# Patient Record
Sex: Male | Born: 1985 | Race: White | Hispanic: No | Marital: Single | Smoking: Current every day smoker
Health system: Southern US, Community
[De-identification: ages and names within clinical notes are randomized; demographics above are authoritative.]

## PROBLEM LIST (undated history)

## (undated) DIAGNOSIS — R569 Unspecified convulsions: Secondary | ICD-10-CM

## (undated) DIAGNOSIS — F419 Anxiety disorder, unspecified: Secondary | ICD-10-CM

## (undated) DIAGNOSIS — F29 Unspecified psychosis not due to a substance or known physiological condition: Secondary | ICD-10-CM

## (undated) DIAGNOSIS — F1910 Other psychoactive substance abuse, uncomplicated: Secondary | ICD-10-CM

## (undated) DIAGNOSIS — R7303 Prediabetes: Secondary | ICD-10-CM

## (undated) DIAGNOSIS — F32A Depression, unspecified: Secondary | ICD-10-CM

## (undated) DIAGNOSIS — F329 Major depressive disorder, single episode, unspecified: Secondary | ICD-10-CM

## (undated) DIAGNOSIS — I10 Essential (primary) hypertension: Secondary | ICD-10-CM

## (undated) DIAGNOSIS — B1920 Unspecified viral hepatitis C without hepatic coma: Secondary | ICD-10-CM

## (undated) DIAGNOSIS — J45909 Unspecified asthma, uncomplicated: Secondary | ICD-10-CM

## (undated) DIAGNOSIS — F4310 Post-traumatic stress disorder, unspecified: Secondary | ICD-10-CM

## (undated) DIAGNOSIS — K219 Gastro-esophageal reflux disease without esophagitis: Secondary | ICD-10-CM

## (undated) DIAGNOSIS — K429 Umbilical hernia without obstruction or gangrene: Secondary | ICD-10-CM

## (undated) DIAGNOSIS — F319 Bipolar disorder, unspecified: Secondary | ICD-10-CM

## (undated) HISTORY — PX: HIP SURGERY: SHX245

## (undated) HISTORY — DX: Essential (primary) hypertension: I10

## (undated) HISTORY — PX: MOUTH SURGERY: SHX715

---
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
## 2003-01-10 ENCOUNTER — Encounter: Payer: Self-pay | Admitting: Emergency Medicine

## 2003-01-10 ENCOUNTER — Emergency Department (HOSPITAL_COMMUNITY): Admission: EM | Admit: 2003-01-10 | Discharge: 2003-01-10 | Payer: Self-pay | Admitting: Emergency Medicine

## 2004-07-15 ENCOUNTER — Inpatient Hospital Stay (HOSPITAL_COMMUNITY): Admission: EM | Admit: 2004-07-15 | Discharge: 2004-07-15 | Payer: Self-pay | Admitting: Emergency Medicine

## 2004-07-16 ENCOUNTER — Ambulatory Visit: Payer: Self-pay | Admitting: Internal Medicine

## 2004-07-16 ENCOUNTER — Inpatient Hospital Stay (HOSPITAL_COMMUNITY): Admission: EM | Admit: 2004-07-16 | Discharge: 2004-07-21 | Payer: Self-pay | Admitting: Emergency Medicine

## 2004-07-18 ENCOUNTER — Encounter (INDEPENDENT_AMBULATORY_CARE_PROVIDER_SITE_OTHER): Payer: Self-pay | Admitting: *Deleted

## 2004-08-23 ENCOUNTER — Emergency Department (HOSPITAL_COMMUNITY): Admission: EM | Admit: 2004-08-23 | Discharge: 2004-08-23 | Payer: Self-pay | Admitting: Emergency Medicine

## 2004-08-24 ENCOUNTER — Emergency Department (HOSPITAL_COMMUNITY): Admission: EM | Admit: 2004-08-24 | Discharge: 2004-08-24 | Payer: Self-pay | Admitting: Emergency Medicine

## 2004-08-25 ENCOUNTER — Emergency Department (HOSPITAL_COMMUNITY): Admission: EM | Admit: 2004-08-25 | Discharge: 2004-08-25 | Payer: Self-pay | Admitting: Emergency Medicine

## 2004-08-26 ENCOUNTER — Emergency Department (HOSPITAL_COMMUNITY): Admission: EM | Admit: 2004-08-26 | Discharge: 2004-08-26 | Payer: Self-pay | Admitting: Emergency Medicine

## 2004-08-29 ENCOUNTER — Emergency Department (HOSPITAL_COMMUNITY): Admission: EM | Admit: 2004-08-29 | Discharge: 2004-08-29 | Payer: Self-pay | Admitting: Emergency Medicine

## 2006-08-29 ENCOUNTER — Emergency Department (HOSPITAL_COMMUNITY): Admission: EM | Admit: 2006-08-29 | Discharge: 2006-08-30 | Payer: Self-pay | Admitting: Emergency Medicine

## 2006-09-04 ENCOUNTER — Emergency Department (HOSPITAL_COMMUNITY): Admission: EM | Admit: 2006-09-04 | Discharge: 2006-09-04 | Payer: Self-pay | Admitting: Emergency Medicine

## 2006-09-19 ENCOUNTER — Emergency Department (HOSPITAL_COMMUNITY): Admission: EM | Admit: 2006-09-19 | Discharge: 2006-09-19 | Payer: Self-pay | Admitting: Emergency Medicine

## 2008-07-15 ENCOUNTER — Emergency Department (HOSPITAL_COMMUNITY): Admission: EM | Admit: 2008-07-15 | Discharge: 2008-07-15 | Payer: Self-pay | Admitting: Emergency Medicine

## 2008-09-05 ENCOUNTER — Emergency Department (HOSPITAL_COMMUNITY): Admission: EM | Admit: 2008-09-05 | Discharge: 2008-09-05 | Payer: Self-pay | Admitting: Emergency Medicine

## 2011-01-08 NOTE — Op Note (Signed)
NAME:  Brian [name], Brian [name] NO.:  [id number]   MEDICAL RECORD NO.:  [id number]          PATIENT TYPE:  INP   LOCATION:  5707                         FACILITY:  [facility]   PHYSICIAN:  Jimmye Norman III, M.D.  DATE OF BIRTH:  10-06-1985   DATE OF PROCEDURE:  07/18/2004  DATE OF DISCHARGE:                                 OPERATIVE REPORT   PREOPERATIVE DIAGNOSIS:  Right inguinal adenopathy with fluctuant abscess  cavity.   POSTOPERATIVE DIAGNOSIS:  Right inguinal adenopathy with fluctuant abscess  cavity.   PROCEDURES:  1.  Incision and drainage of right inguinal or groin abscess.  2.  Right inguinal lymph node biopsy.   SURGEON:  Jimmye Norman, M.D.   ANESTHESIA:  General endotracheal.   ESTIMATED BLOOD LOSS:  Less than 30 cc.   COMPLICATIONS:  None.   CONDITION:  Stable.   FINDINGS:  The patient had a large abscess cavity into the groin and the  lateral aspect of the upper right thigh with about 30-40 cc of watery cream  pus removed.  He also had significant matted adenopathy of the lymph nodes,  one of which was removed and sent for a specimen.   OPERATION:  The patient was taken to the operating room and placed on the  table in the supine position.  After an adequate general anesthetic was  administered, he was prepped and draped in the usual sterile manner exposing  his right groin.   An incision was made from the open area medially to the anterior-superior  iliac spine laterally.  We took it down deeply into the subcutaneous tissue  where a large abscess cavity was opened and drained.  We did this mostly  with a #10 blade and electrocautery.  Once we had done so, we wiped out most  of the fluid, sent aerobic and anaerobic cultures.  Also, along the medial  aspect, there was a large 2 x 3-cm lymph node which we dissected out using  Hemoclips to close off the lymphatics.  Once we had had this removed, and  hemostasis was obtained, we packed it with  Betadine-soaked Kerlix gauze.  A  sterile dressing was applied.  All needle counts, sponge counts, and  instrument counts were correct.       JW/MEDQ  D:  07/18/2004  T:  07/18/2004  Job:  [id number]

## 2011-01-08 NOTE — Discharge Summary (Signed)
NAME:  Brian [name], Brian [name] NO.:  [id number]   MEDICAL RECORD NO.:  [id number]          PATIENT TYPE:  INP   LOCATION:  5707                         FACILITY:  [facility]   PHYSICIAN:  Artist Beach, MD        DATE OF BIRTH:  10-14-1985   DATE OF ADMISSION:  07/15/2004  DATE OF DISCHARGE:  07/21/2004                                 DISCHARGE SUMMARY   ATTENDING ON TEAM:  Dr. Harvie Junior.   SENIOR RESIDENT:  Dr. Jeanella Craze.   INTERN:  Dr. Eliane Decree.   DISCHARGE DIAGNOSES:  1.  Posttraumatic right inguinal abscess with cellulitis methicillin-      susceptible Staphylococcus aureus positive.  2.  History of childhood asthma.   DISCHARGE MEDICATIONS:  1.  Doxycycline 100 mg b.i.d. for two weeks.  2.  Tylenol 650 1-2 tablets every 4-6 hours p.r.n. for pain.   FOLLOWUP:  The patient is to follow up with Dr. Jimmye Norman in his office on  August 10, 2004 at 3:30 p.m.  The patient gets daily dressing b.i.d. from  home health care.   PROCEDURES PERFORMED:  The patient had I&D of right inguinal abscess on  July 18, 2004 under general anesthesia with  lymphadenectomy tissue and  lymph nodes were sent for culture.  His would culture grew methicillin-  sensitive Staphylococcus aureus which was sensitive to cefazolin,  gentamicin, oxacillin, rifampin, Vancomycin, tetracycline, ampicillin, and  moxifloxacin.  Blood cultures were negative x2.  Lymph node was sent for  pathologic evaluation.  It showed benign lymph node with nonspecific changes  including peri cortical hyperplasia, plasmacytosis and dermatophytic  changes.   CONSULTATIONS:  General Surgeon Dr. Jimmye Norman was consulted for management  of right inguinal abscess.   HISTORY OF PRESENT ILLNESS:  Brian [name] is a 25 year old white male patient  with a past history of childhood asthma, presented to the ED with complaint  of swelling, redness and abscess formation for two days before admission.  Two days prior to admission  the patient noticed oozing of brown thick pus  from abscess, this was associated with fever, nausea and vomiting and very  severe pain.  Seven days prior to admission the patient was injured by  barbed wire while jumping over a fence.   PHYSICAL EXAMINATION:  Temperature 98.8, pulse 84, respiratory rate 20,  blood pressure 116/64, saturating 99% on room air.  GENERAL:  Moderately built in no distress.  EYES:  Pupils round and reacting to light.  Extraocular movements intact.  Old scar below right eye laterally present from trauma.  ENT:  Normal tympanic membranes reflex, no congestion of oropharynx.  NECK:  Supple, no JVD, bruit or lymphadenopathy.  RESPIRATION:  Air entry bilaterally equal, no rales, rhonchi or wheeze  appreciated.  CARDIOVASCULAR:  S1, S2 normal, no murmurs, rubs or gallops.  ABDOMEN:  Soft, nontender, positive bowel sounds present, no organomegaly.  EXTREMITIES:  Right inguinal area showed 2 x 2 cm abscess surrounded by 12  cm of erythema extending laterally, severe tenderness present, no induration  or fluctuation.  Actively oozing moderate amount of  dirty-brown-looking pus.  LYMPH NODES:  Not palpable.  MUSCULOSKELETAL:  Normal range of movement at hip joint.  NEUROLOGIC:  Grossly nonfocal.  PSYCHIATRIC:  Appropriate.   LABS ON ADMISSION:  Sodium 136, potassium 3.2, chloride 103, bicarb 24, BUN  5, creatinine 0.8, glucose 102, hemoglobin 14.3, hematocrit 40.8, WBC 12.6  with ANC of 10.3, neutrophils 82%, platelets 157, monocytes 1.1.  Urine drug  screen was positive for THC.  Magnesium was 2.2.  HIV was nonreactive.  Coags were normal.   HOSPITAL COURSE BY ACTIVE PROBLEM:  1.  Posttraumatic right inguinal abscess with surrounding cellulitis.  The      patient was admitted and empirically started on Vancomycin and Zosyn.      With no improvement after 48 hours of antibiotics, General Surgeon Dr.      Lindie Spruce was consulted.  He decided to do I&D under GA on  July 18, 2004      and postop was started on doxycycline.  As the patient was afebrile for      48 hours on doxycycline 100 mg orally, he was continued on that during      the remainder of admission and discharged on it to continue for 14 days.      On discharge the patient had a big cavity, gauze piece was put in it, it      was still soaking, the patient is to get dressing change b.i.d. from      nurse from home health care.  The patient is to take Tylenol 650 p.r.n.      for pain.  2.  Childhood asthma.  The patient was stable during entire admission.  He      said he has not had an asthma attack since he was 25 years of age.  3.  Tobacco abuse.  The patient was put on nicotine patch, but he continued      to smoke while in the hospital.  He refused smoking cessation      counseling.   DISCHARGE LABS:  CBC on July 19, 2004 showed WBC 5.8, hemoglobin 12.9,  hematocrit 37.2, platelets 321, absolute neutrophil count of 3.7,       SP/MEDQ  D:  07/21/2004  T:  07/21/2004  Job:  [id number]   cc:   Alvester Morin, M.D.  [address]  [place]  [place] [address]  Fax: [phone]   Jimmye Norman III, M.D.  [phone] [address], [address]  [place]  [place] [address]  Fax: [phone]

## 2011-01-08 NOTE — Consult Note (Signed)
NAME:  Brian, [name] NO.:  [id number]   MEDICAL RECORD NO.:  [id number]          PATIENT TYPE:  INP   LOCATION:  5707                         FACILITY:  [facility]   PHYSICIAN:  Jimmye Norman III, M.D.  DATE OF BIRTH:  11/27/1985   DATE OF CONSULTATION:  07/17/2004  DATE OF DISCHARGE:                                   CONSULTATION   REASON FOR CONSULTATION:  Dr. Harvie Junior, thank you very much for asking me to see Brian [name], a 25-year-  old gentleman with no previous medical history, except for childhood asthma,  who comes in with a right groin infection.   HISTORY OF PRESENT ILLNESS:  The patient has been sick for about 4-5 days.  He has manually decompressed the abscess, which is in the lateral groin  abscess, through a medial punctate opening by putting manual pressure on it.  He finally came to the emergency room.   PAST HISTORY:  1.  His only past history is significant for asthmatic bronchitis as a      child, and also currently because he is a smoker.  2.  He had a history of a right arm laceration.   MEDICATIONS:  He takes albuterol p.r.n. for an inhaler.   SOCIAL HISTORY:  He has a poor relationship with his family.  He has a  fiancee.  He works in a gas station 2 days a week.  He is a smoker.  He did  drink a fair amount, but apparently quit in February.   PHYSICAL EXAMINATION:  GENERAL:  Currently, he looks healthy, in no acute  distress.  VITAL SIGNS:  His temperature is 99.3.  Other vital signs are stable.  RIGHT GROIN:  He has a full swollen area in his right groin with erythema  laterally, and a punctate opening medially, no draining any pus actively.  The patient was just eating lunch, and just finished a full meal.  Therefore, would have to wait 6 hours or so for surgery, as this is not  currently an emergency, although it will require treatment.   PLAN:  The plan is to take the patient to the operating room tomorrow  morning for incision and  drainage of his right groin abscess, and we will  sent cultures at that time.  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  Therefore, we  will start the patient on doxycycline for this possibility also.  Cultures  will be sent from the operating room also.       JW/MEDQ  D:  07/17/2004  T:  07/17/2004  Job:  [id number]   cc:   Alvester Morin, M.D.  [address]  [place]  [place] [address]  Fax: [phone]   Dr. Marjory Sneddon

## 2011-06-28 ENCOUNTER — Emergency Department (HOSPITAL_COMMUNITY)
Admission: EM | Admit: 2011-06-28 | Discharge: 2011-06-29 | Disposition: A | Discharge status: Other Acute Inpatient Hospital | Payer: Self-pay | Attending: Emergency Medicine | Admitting: Emergency Medicine

## 2011-06-28 ENCOUNTER — Encounter (HOSPITAL_COMMUNITY): Payer: Self-pay | Admitting: *Deleted

## 2011-06-28 DIAGNOSIS — F17200 Nicotine dependence, unspecified, uncomplicated: Secondary | ICD-10-CM | POA: Insufficient documentation

## 2011-06-28 DIAGNOSIS — F411 Generalized anxiety disorder: Secondary | ICD-10-CM | POA: Insufficient documentation

## 2011-06-28 DIAGNOSIS — F4310 Post-traumatic stress disorder, unspecified: Secondary | ICD-10-CM | POA: Insufficient documentation

## 2011-06-28 DIAGNOSIS — R443 Hallucinations, unspecified: Secondary | ICD-10-CM | POA: Insufficient documentation

## 2011-06-28 DIAGNOSIS — R1905 Periumbilic swelling, mass or lump: Secondary | ICD-10-CM | POA: Insufficient documentation

## 2011-06-28 DIAGNOSIS — R45851 Suicidal ideations: Secondary | ICD-10-CM | POA: Insufficient documentation

## 2011-06-28 DIAGNOSIS — R451 Restlessness and agitation: Secondary | ICD-10-CM

## 2011-06-28 DIAGNOSIS — F3289 Other specified depressive episodes: Secondary | ICD-10-CM | POA: Insufficient documentation

## 2011-06-28 DIAGNOSIS — IMO0002 Reserved for concepts with insufficient information to code with codable children: Secondary | ICD-10-CM | POA: Insufficient documentation

## 2011-06-28 DIAGNOSIS — F419 Anxiety disorder, unspecified: Secondary | ICD-10-CM

## 2011-06-28 DIAGNOSIS — R45850 Homicidal ideations: Secondary | ICD-10-CM | POA: Insufficient documentation

## 2011-06-28 DIAGNOSIS — F329 Major depressive disorder, single episode, unspecified: Secondary | ICD-10-CM | POA: Insufficient documentation

## 2011-06-28 HISTORY — DX: Post-traumatic stress disorder, unspecified: F43.10

## 2011-06-28 HISTORY — DX: Depression, unspecified: F32.A

## 2011-06-28 HISTORY — DX: Anxiety disorder, unspecified: F41.9

## 2011-06-28 HISTORY — DX: Major depressive disorder, single episode, unspecified: F32.9

## 2011-06-28 LAB — ACETAMINOPHEN LEVEL: Acetaminophen (Tylenol), Serum: <15 ug/mL (ref 10–30)

## 2011-06-28 LAB — CBC
HCT: 44.7 % (ref 39.0–52.0)
Hemoglobin: 15.6 g/dL (ref 13.0–17.0)
RBC: 5.24 MIL/uL (ref 4.22–5.81)
WBC: 7.6 10*3/uL (ref 4.0–10.5)

## 2011-06-28 LAB — COMPREHENSIVE METABOLIC PANEL
ALT: 24 U/L (ref 0–53)
Alkaline Phosphatase: 56 U/L (ref 39–117)
BUN: 11 mg/dL (ref 6–23)
CO2: 25 mEq/L (ref 19–32)
Chloride: 104 mEq/L (ref 96–112)
GFR calc Af Amer: >90 mL/min (ref >90)
GFR calc non Af Amer: >90 mL/min (ref >90)
Glucose, Bld: 79 mg/dL (ref 70–99)
Potassium: 4.2 mEq/L (ref 3.5–5.1)
Sodium: 137 mEq/L (ref 135–145)
Total Bilirubin: 0.3 mg/dL (ref 0.3–1.2)
Total Protein: 7.8 g/dL (ref 6.0–8.3)

## 2011-06-28 LAB — ETHANOL: Alcohol, Ethyl (B): <11 mg/dL (ref 0–11)

## 2011-06-28 LAB — RAPID URINE DRUG SCREEN, HOSP PERFORMED
Barbiturates: NOT DETECTED
Benzodiazepines: NOT DETECTED

## 2011-06-28 LAB — SALICYLATE LEVEL: Salicylate Lvl: <2 mg/dL — ABNORMAL LOW (ref 2.8–20.0)

## 2011-06-28 MED ORDER — LORAZEPAM 1 MG PO TABS
1.0000 mg | ORAL_TABLET | Freq: Three times a day (TID) | ORAL | Status: DC | PRN
  Administered 2011-06-29 (×2): 1 mg via ORAL
  Filled 2011-06-28 (×2): qty 1

## 2011-06-28 MED ORDER — LORAZEPAM 1 MG PO TABS
2.0000 mg | ORAL_TABLET | Freq: Once | ORAL | Status: AC
  Administered 2011-06-28: 2 mg via ORAL
  Filled 2011-06-28: qty 2

## 2011-06-28 MED ORDER — ALUM & MAG HYDROXIDE-SIMETH 200-200-20 MG/5ML PO SUSP: 30.0000 mL | ORAL | Status: DC | PRN

## 2011-06-28 MED ORDER — ZOLPIDEM TARTRATE 5 MG PO TABS: 5.0000 mg | ORAL_TABLET | Freq: Every evening | ORAL | Status: DC | PRN

## 2011-06-28 MED ORDER — IBUPROFEN 200 MG PO TABS
600.0000 mg | ORAL_TABLET | Freq: Three times a day (TID) | ORAL | Status: DC | PRN
  Administered 2011-06-29: 600 mg via ORAL
  Filled 2011-06-28: qty 3

## 2011-06-28 MED ORDER — NICOTINE 21 MG/24HR TD PT24
21.0000 mg | MEDICATED_PATCH | Freq: Every day | TRANSDERMAL | Status: DC
  Administered 2011-06-29: 21 mg via TRANSDERMAL

## 2011-06-28 MED ORDER — HALOPERIDOL LACTATE 5 MG/ML IJ SOLN: 5.0000 mg | Freq: Once | INTRAMUSCULAR | Status: DC

## 2011-06-28 MED ORDER — ACETAMINOPHEN 325 MG PO TABS
650.0000 mg | ORAL_TABLET | ORAL | Status: DC | PRN
  Administered 2011-06-29: 650 mg via ORAL
  Filled 2011-06-28: qty 2

## 2011-06-28 MED ORDER — ONDANSETRON HCL 4 MG PO TABS: 4.0000 mg | ORAL_TABLET | Freq: Three times a day (TID) | ORAL | Status: DC | PRN

## 2011-06-28 NOTE — ED Notes (Signed)
Report called to Kathlene November, RN to move pt to Psych Rm 41.

## 2011-06-28 NOTE — ED Notes (Signed)
One bag belongings placed in activity room in psych ed.

## 2011-06-28 NOTE — Progress Notes (Signed)
Assessment Note   Brian [name] is an 25 y.o. male.   Axis I: Anxiety Disorder NOS Axis II: Deferred Axis III:  Past Medical History  Diagnosis Date  . PTSD (post-traumatic stress disorder)   . Anxiety   . Depression    Axis IV: problems with primary support group Axis V: 31-40 impairment in reality testing  Past Medical History:  Past Medical History  Diagnosis Date  . PTSD (post-traumatic stress disorder)   . Anxiety   . Depression     History reviewed. No pertinent past surgical history.  Family History: History reviewed. No pertinent family history.  Social History:  reports that he has been smoking Cigarettes.  He does not have any smokeless tobacco history on file. 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 He reports that he does not drink alcohol.  Allergies:  Allergies  Allergen Reactions  . Dilantin     Gets sick to his stomach    Home Medications:  Medications Prior to Admission  Medication Dose Route Frequency Provider Last Rate Last Dose  . acetaminophen (TYLENOL) tablet 650 mg  650 mg Oral Q4H PRN Forbes Cellar, MD      . alum & mag hydroxide-simeth (MAALOX/MYLANTA) 200-200-20 MG/5ML suspension 30 mL  30 mL Oral PRN Forbes Cellar, MD      . haloperidol lactate (HALDOL) injection 5 mg  5 mg Intramuscular Once Forbes Cellar, MD      . ibuprofen (ADVIL,MOTRIN) tablet 600 mg  600 mg Oral Q8H PRN Forbes Cellar, MD      . LORazepam (ATIVAN) tablet 1 mg  1 mg Oral Q8H PRN Forbes Cellar, MD      . LORazepam (ATIVAN) tablet 2 mg  2 mg Oral Once Forbes Cellar, MD   2 mg at 06/28/11 1755  . nicotine (NICODERM CQ - dosed in mg/24 hours) patch 21 mg  21 mg Transdermal Daily Forbes Cellar, MD      . ondansetron [facility]) tablet 4 mg  4 mg Oral Q8H PRN Forbes Cellar, MD      . zolpidem (AMBIEN) tablet 5 mg  5 mg Oral QHS PRN Forbes Cellar, MD       No current outpatient prescriptions on file as of 06/28/2011.    OB/GYN  Status:  No LMP for male patient.     Risk to self Is patient at risk for suicide?: Yes Substance abuse history and/or treatment for substance abuse?: Yes     Mental Status Report Motor Activity: Agitation;Restlessness        ADL Screening (condition at time of admission) Patient's cognitive ability adequate to safely complete daily activities?: Yes Patient able to express need for assistance with ADLs?: Yes Independently performs ADLs?: Yes Weakness of Legs: None Weakness of Arms/Hands: None  Home Assistive Devices/Equipment Home Assistive Devices/Equipment: None    Abuse/Neglect Assessment (Assessment to be complete while patient is alone) Physical Abuse: Denies Verbal Abuse: Denies Sexual Abuse: Denies Exploitation of patient/patient's resources: Denies Self-Neglect: Denies Values / Beliefs Cultural Requests During Hospitalization: None Spiritual Requests During Hospitalization: None   Advance Directives (For Healthcare) Advance Directive: Patient does not have advance directive;Patient would not like information Pre-existing out of facility DNR order (yellow form or pink MOST form): No          Disposition:  Assessment Note   Brian [name] is an 25 y.o. male. na  Axis I: 296.9 Axis II: Deferred Axis III: None Reported Past Medical History  Diagnosis Date  .  PTSD (post-traumatic stress disorder)   . Anxiety   . Depression    Axis IV: housing problems, other psychosocial or environmental problems, problems related to social environment and problems with primary support group Axis V: 21-30 behavior considerably influenced by delusions or hallucinations OR serious impairment in judgment, communication OR inability to function in almost all areas  Past Medical History:  Past Medical History  Diagnosis Date  . PTSD (post-traumatic stress disorder)   . Anxiety   . Depression     History reviewed. No pertinent past surgical history.  Family  History: History reviewed. No pertinent family history.  Social History:  reports that he has been smoking Cigarettes.  He does not have any smokeless tobacco history on file. 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 He reports that he does not drink alcohol.  Allergies:  Allergies  Allergen Reactions  . Dilantin     Gets sick to his stomach    Home Medications:  Medications Prior to Admission  Medication Dose Route Frequency Provider Last Rate Last Dose  . acetaminophen (TYLENOL) tablet 650 mg  650 mg Oral Q4H PRN Forbes Cellar, MD      . alum & mag hydroxide-simeth (MAALOX/MYLANTA) 200-200-20 MG/5ML suspension 30 mL  30 mL Oral PRN Forbes Cellar, MD      . haloperidol lactate (HALDOL) injection 5 mg  5 mg Intramuscular Once Forbes Cellar, MD      . ibuprofen (ADVIL,MOTRIN) tablet 600 mg  600 mg Oral Q8H PRN Forbes Cellar, MD      . LORazepam (ATIVAN) tablet 1 mg  1 mg Oral Q8H PRN Forbes Cellar, MD      . LORazepam (ATIVAN) tablet 2 mg  2 mg Oral Once Forbes Cellar, MD   2 mg at 06/28/11 1755  . nicotine (NICODERM CQ - dosed in mg/24 hours) patch 21 mg  21 mg Transdermal Daily Forbes Cellar, MD      . ondansetron [facility]) tablet 4 mg  4 mg Oral Q8H PRN Forbes Cellar, MD      . zolpidem (AMBIEN) tablet 5 mg  5 mg Oral QHS PRN Forbes Cellar, MD       No current outpatient prescriptions on file as of 06/28/2011.    OB/GYN Status:  No LMP for male patient.     Risk to self Is patient at risk for suicide?: Yes Substance abuse history and/or treatment for substance abuse?: Yes     Mental Status Report Motor Activity: Agitation;Restlessness        ADL Screening (condition at time of admission) Patient's cognitive ability adequate to safely complete daily activities?: Yes Patient able to express need for assistance with ADLs?: Yes Independently performs ADLs?: Yes Weakness of Legs: None Weakness of Arms/Hands: None  Home  Assistive Devices/Equipment Home Assistive Devices/Equipment: None    Abuse/Neglect Assessment (Assessment to be complete while patient is alone) Physical Abuse: Denies Verbal Abuse: Denies Sexual Abuse: Denies Exploitation of patient/patient's resources: Denies Self-Neglect: Denies Values / Beliefs Cultural Requests During Hospitalization: None Spiritual Requests During Hospitalization: None   Advance Directives (For Healthcare) Advance Directive: Patient does not have advance directive;Patient would not like information Pre-existing out of facility DNR order (yellow form or pink MOST form): No       Disposition:   PT. IS 25 YR OLD D/W/M WHO PRESENTS TO [facility] WITH SH/HI AND SA, PLS SEE ASSESSMENT.  THIS PT. IS REFUSING TO COMMUNICATE W/ANY STAFF INCLUDING THIS [facility] COUNSELOR, HOWEVER, PT.  DECIDED TO SPEAK W/THIS COUNSELOR BUT EXPRESSED VERY LITTLE ABOUT ISSUES THAT BROUGHT HIM TO THE ED. WHEN ASKED ABOUT PLANS TO HARM SELF/OTHERS, PT. SAID--"IT'LL ALL COME TOGETHER".  PT. ALSO STATED HE WAS HI TOWARDS MOTHER, POLICE, MED STAFF AND THIS COUNSELOR--"I'M TRYING HOW TO FIGURE NOT KILL YOU"  AND  PT. IS IRRITABLE OFTEN ANSWERING--THAT'S NONE OF YOUR BUSINESS" WHEN QUESTIONS WERE POSED TO HIM.  PT. WOULD NOT DISCLOSE MEDICATION USE OR ANY PAST HX OF PSYCH CARE.  PT. SAYS--"I CAME HERE, YOU NEED TO FIGURE WHAT MEDS I'M TAKING AND WHAT MENTAL HEALTH CARE I HAD".  PT. IS  DEFENSIVE AND SMUGLY SMILING.  IT IS REPORTED THAT PT. IS HOMELESS .  [facility] IS PENDING FOR THIS PT.   On Site Evaluation by:   Reviewed with Physician:     Liliane Bade 06/28/2011 11:48 PM   On Site Evaluation by:   Reviewed with Physician:     Liliane Bade 06/28/2011 11:46 PM

## 2011-06-28 NOTE — ED Provider Notes (Signed)
History     CSN: [id number] Arrival date & time: 06/28/2011  2:25 PM   First MD Initiated Contact with Patient 06/28/11 1506      Chief Complaint  Patient presents with  . Medical Clearance    Pt reports homicidal thoughts, denies SI. Reports hx of PTSD. Reports not sleeping x's 1 month    HPI 25 year old male history of postpartum stress disorder, anxiety, question bipolar/hernia presents with multiple complaints. The patient states that he feels very anxious and more depressed the usual. He states that he has not been sleeping for over a month. He states that he wants to hurt himself because life is not worth living anymore secondary to his drug problems. He does state that he has homicidal ideation but is nonspecific stating "I was want to hurt people, it just takes a second just like that." He states that he is seeing hallucinations visual hallucinations though scary face when he closes his eyes and he is occasionally feeling something touches legs when he knows that there is nothing there. He states that he has not taken medication in quite some time he does not known medications he has been on previously although he does state he previously been on antipsychotic medication. He denies any alcohol today. He states that he did look marijuana today. 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 Denies headache, dizziness, cp/sob. +subj fever, no chills. Denies abd pain/n/v/uti sx/back pain.   Past Medical History  Diagnosis Date  . PTSD (post-traumatic stress disorder)   . Anxiety   . Depression     History reviewed. No pertinent past surgical history.  History reviewed. No pertinent family history.  History  Substance Use Topics  . Smoking status: Current Everyday Smoker    Types: Cigarettes  . Smokeless tobacco: Not on file  . Alcohol Use: No      Review of Systems  All other systems reviewed and are negative.  Negative except as  noted history of present illness  Allergies  Dilantin  Home Medications  No current outpatient prescriptions on file.  Unable  BP 118/59  Pulse 74  Temp(Src) 98.7 F (37.1 C) (Oral)  Resp 18  SpO2 98%  Physical Exam  Nursing note and vitals reviewed. Constitutional: He is oriented to person, place, and time. He appears well-developed and well-nourished. No distress.       Appears anxious  HENT:  Head: Atraumatic.  Mouth/Throat: Oropharynx is clear and moist.  Eyes: Conjunctivae and EOM are normal. Pupils are equal, round, and reactive to light.  Neck: Neck supple.  Cardiovascular: Normal rate, regular rhythm, normal heart sounds and intact distal pulses.  Exam reveals no gallop and no friction rub.   No murmur heard. Pulmonary/Chest: Effort normal. No respiratory distress. He has no wheezes. He has no rales.  Abdominal: Soft. Bowel sounds are normal. He exhibits mass. There is no tenderness. There is no rebound and no guarding.       Small supraumbilical mass palpated Min ttp  Musculoskeletal: Normal range of motion. He exhibits no edema and no tenderness.  Neurological: He is alert and oriented to person, place, and time.  Skin: Skin is warm and dry.  Psychiatric: He has a normal mood and affect.    ED Course  Procedures (including critical care time)  Labs Reviewed  SALICYLATE LEVEL - Abnormal; Notable for the following:    Salicylate Lvl <2.0 (*)    All other components within  normal limits  CBC  COMPREHENSIVE METABOLIC PANEL  ETHANOL  URINE RAPID DRUG SCREEN (HOSP PERFORMED)  ACETAMINOPHEN LEVEL  LIPASE, BLOOD   No results found.   1. Agitation   2. Anxiety   3. Suicidal ideation   4. Homicidal ideation       MDM  pw multiple complaints including soft SI/HI/Visual and tactile hallucinations, anxiety. Will give ativan. Order psych labs. [facility] assessment.  Stefano Gaul, MD  5:58 PM  Labs reviewed and unremarkable. [facility] eval  pending       Forbes Cellar, MD 06/28/11 [phone]

## 2011-06-28 NOTE — ED Notes (Signed)
Pt snoring, resting comfortable in bed. NAD noted.

## 2011-06-28 NOTE — ED Notes (Signed)
Pt refuses to talk to ACT team members for assessment and will not speak with me either. Pt will wake up and is oriented but will not converse.

## 2011-06-28 NOTE — ED Notes (Signed)
Pt resting quietly in bed. Resps unlabored. NAD noted.  

## 2011-06-28 NOTE — ED Notes (Signed)
Pt sent here from [facility] for med clearance d/t heron cravings, denies SI/HI

## 2011-06-28 NOTE — ED Notes (Signed)
Meal Tray Delivered.  
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
## 2011-06-28 NOTE — ED Notes (Signed)
Assumed care of patient from dayshift RN. Patient resting in room, NAD noted at this time.  

## 2011-06-29 ENCOUNTER — Inpatient Hospital Stay (HOSPITAL_COMMUNITY)
Admission: AD | Admit: 2011-06-29 | Discharge: 2011-07-19 | DRG: 885 | Disposition: A | Discharge status: Home / Self Care | Payer: PRIVATE HEALTH INSURANCE | Source: Ambulatory Visit | Attending: Psychiatry | Admitting: Psychiatry

## 2011-06-29 ENCOUNTER — Encounter (HOSPITAL_COMMUNITY): Payer: Self-pay | Admitting: *Deleted

## 2011-06-29 DIAGNOSIS — F17200 Nicotine dependence, unspecified, uncomplicated: Secondary | ICD-10-CM

## 2011-06-29 DIAGNOSIS — F1920 Other psychoactive substance dependence, uncomplicated: Secondary | ICD-10-CM | POA: Diagnosis present

## 2011-06-29 DIAGNOSIS — F29 Unspecified psychosis not due to a substance or known physiological condition: Secondary | ICD-10-CM | POA: Diagnosis present

## 2011-06-29 DIAGNOSIS — Z79899 Other long term (current) drug therapy: Secondary | ICD-10-CM

## 2011-06-29 DIAGNOSIS — F3289 Other specified depressive episodes: Secondary | ICD-10-CM

## 2011-06-29 DIAGNOSIS — F411 Generalized anxiety disorder: Secondary | ICD-10-CM

## 2011-06-29 DIAGNOSIS — F329 Major depressive disorder, single episode, unspecified: Secondary | ICD-10-CM

## 2011-06-29 DIAGNOSIS — F4310 Post-traumatic stress disorder, unspecified: Secondary | ICD-10-CM

## 2011-06-29 MED ORDER — ACETAMINOPHEN 325 MG PO TABS
650.0000 mg | ORAL_TABLET | Freq: Four times a day (QID) | ORAL | Status: DC | PRN
  Administered 2011-06-29 – 2011-07-08 (×2): 650 mg via ORAL

## 2011-06-29 MED ORDER — NICOTINE 21 MG/24HR TD PT24
MEDICATED_PATCH | TRANSDERMAL | Status: AC
  Administered 2011-06-29: 21 mg via TRANSDERMAL
  Filled 2011-06-29: qty 1

## 2011-06-29 MED ORDER — DIPHENHYDRAMINE HCL 50 MG PO CAPS
50.0000 mg | ORAL_CAPSULE | Freq: Four times a day (QID) | ORAL | Status: DC | PRN
  Administered 2011-06-29 – 2011-07-01 (×2): 50 mg via ORAL
  Filled 2011-06-29: qty 1

## 2011-06-29 MED ORDER — TRAZODONE HCL 100 MG PO TABS
100.0000 mg | ORAL_TABLET | Freq: Every evening | ORAL | Status: DC | PRN
  Administered 2011-07-04 – 2011-07-18 (×7): 100 mg via ORAL
  Filled 2011-06-29 (×6): qty 1
  Filled 2011-06-29: qty 5

## 2011-06-29 MED ORDER — RISPERIDONE 1 MG PO TABS
1.0000 mg | ORAL_TABLET | Freq: Four times a day (QID) | ORAL | Status: DC | PRN
  Administered 2011-06-29 – 2011-07-01 (×4): 1 mg via ORAL
  Filled 2011-06-29 (×4): qty 1

## 2011-06-29 MED ORDER — ALUM & MAG HYDROXIDE-SIMETH 200-200-20 MG/5ML PO SUSP: 30.0000 mL | ORAL | Status: DC | PRN

## 2011-06-29 MED ORDER — MAGNESIUM HYDROXIDE 400 MG/5ML PO SUSP: 30.0000 mL | Freq: Every day | ORAL | Status: DC | PRN

## 2011-06-29 NOTE — ED Notes (Signed)
Pt resting quietly, reports that he is here because he is hearing voices adn seeing qhosts.  Pt reports this has been going on all of his life and that none of the medications he has taken has helped.  Pt reports that he has had suicidal thoughts w/o a plan, but reports he "took a bunch of pills."  Pt also report that he has homicidal thoughts towards his mother.  Pt also reports he broke his spine as a child and has had chronic back pain since.  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  NAD, procedures explained.

## 2011-06-29 NOTE — ED Notes (Signed)
Report recieved 

## 2011-06-29 NOTE — ED Notes (Signed)
Lunch given.

## 2011-06-29 NOTE — ED Notes (Signed)
Pt resting quietly in bed. Resps unlabored. NAD noted.  

## 2011-06-29 NOTE — ED Notes (Signed)
ACT into see 

## 2011-06-29 NOTE — ED Notes (Signed)
Pt ambulated to bathroom and back to room. NAD noted.

## 2011-06-29 NOTE — ED Notes (Signed)
Asked pt if he would like a shower. Gave patient toiletries and clean scrubs. Cleaned room and changed linen while patient showering. Pt requested drink after shower. Provided with Sprite.

## 2011-06-29 NOTE — ED Notes (Signed)
Tele psych consult initiated; Dr. Acey Lav to call back within the hour.

## 2011-06-29 NOTE — Progress Notes (Signed)
  Pt. Has been in bed resting quietly since writer arrived this pm.  Pt. Was reported to have been very anxious on admit so staff has allowed pt. To rest.  Writer has observed pt. Who appears to be snoring softly with no signs of distress or discomfort noted at this time.
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
## 2011-06-29 NOTE — ED Notes (Signed)
Pt transported to [facility] by security and NT--1 baga of belongings sent w/ pt
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
## 2011-06-29 NOTE — ED Notes (Signed)
Pt increasingly anxious, pacing in the hall-dr wentz updated, medicated as ordered.

## 2011-06-29 NOTE — ED Notes (Signed)
Report called to Beverly RN at [facility].   ?

## 2011-06-29 NOTE — ED Notes (Signed)
Resting quietly

## 2011-06-29 NOTE — ED Notes (Signed)
Up tot he bathroom to shower and change scrubs 

## 2011-06-29 NOTE — ED Notes (Signed)
Pt requesting methadone or something for detox, dr Effie Shy aware and will see.

## 2011-06-29 NOTE — ED Notes (Signed)
Tele psych finished. Pt ambulated to restroom and back to bed without problem.

## 2011-06-29 NOTE — ED Notes (Signed)
Lunch tray delivered.

## 2011-06-29 NOTE — ED Notes (Signed)
Patient came to this tech and stated he dropped his entire lunch tray on the floor. Told pt not to worry. Gave pt ham sandwich, cheese stick and ice cream. Patient was happy with this.

## 2011-06-29 NOTE — ED Notes (Signed)
Up to the desk on the phone 

## 2011-06-29 NOTE — ED Notes (Signed)
Pt reports he takes a "red pill" for his seizures-does not know what it is and he ran out about 1 month ago--last seizure 2007 per pt

## 2011-06-29 NOTE — ED Notes (Signed)
Pt has been accepted to [name] [place] [phone]

## 2011-06-29 NOTE — ED Notes (Signed)
Resting quietly, up to the bathroom

## 2011-06-29 NOTE — ED Notes (Signed)
Meal Tray Delivered.  

## 2011-06-29 NOTE — ED Notes (Signed)
Telepsych in progress. 

## 2011-06-30 MED ORDER — METHOCARBAMOL 500 MG PO TABS
500.0000 mg | ORAL_TABLET | Freq: Three times a day (TID) | ORAL | Status: AC | PRN
  Administered 2011-07-03 – 2011-07-04 (×3): 500 mg via ORAL
  Filled 2011-06-30 (×3): qty 1

## 2011-06-30 MED ORDER — ONDANSETRON 4 MG PO TBDP: 4.0000 mg | ORAL_TABLET | Freq: Four times a day (QID) | ORAL | Status: AC | PRN

## 2011-06-30 MED ORDER — HYDROXYZINE HCL 25 MG PO TABS
25.0000 mg | ORAL_TABLET | Freq: Four times a day (QID) | ORAL | Status: DC | PRN
  Administered 2011-06-30: 25 mg via ORAL

## 2011-06-30 MED ORDER — CLONIDINE HCL 0.1 MG PO TABS
0.1000 mg | ORAL_TABLET | Freq: Four times a day (QID) | ORAL | Status: AC
  Administered 2011-06-30 – 2011-07-01 (×7): 0.1 mg via ORAL
  Filled 2011-06-30 (×3): qty 1

## 2011-06-30 MED ORDER — DICYCLOMINE HCL 20 MG PO TABS
20.0000 mg | ORAL_TABLET | ORAL | Status: AC | PRN
  Administered 2011-06-30 – 2011-07-04 (×2): 20 mg via ORAL
  Filled 2011-06-30 (×2): qty 1

## 2011-06-30 MED ORDER — NICOTINE 21 MG/24HR TD PT24
21.0000 mg | MEDICATED_PATCH | Freq: Every day | TRANSDERMAL | Status: DC
  Administered 2011-06-30 – 2011-07-09 (×8): 21 mg via TRANSDERMAL
  Filled 2011-06-30 (×12): qty 1

## 2011-06-30 MED ORDER — NAPROXEN 500 MG PO TABS
500.0000 mg | ORAL_TABLET | Freq: Two times a day (BID) | ORAL | Status: AC | PRN
  Administered 2011-06-30 – 2011-07-04 (×4): 500 mg via ORAL
  Filled 2011-06-30 (×4): qty 1

## 2011-06-30 MED ORDER — CLONIDINE HCL 0.1 MG PO TABS
0.1000 mg | ORAL_TABLET | Freq: Every day | ORAL | Status: AC
  Administered 2011-07-04: 0.1 mg via ORAL
  Filled 2011-06-30 (×2): qty 1

## 2011-06-30 MED ORDER — LOPERAMIDE HCL 2 MG PO CAPS: 2.0000 mg | ORAL_CAPSULE | ORAL | Status: AC | PRN

## 2011-06-30 MED ORDER — RISPERIDONE 2 MG PO TABS
2.0000 mg | ORAL_TABLET | Freq: Every day | ORAL | Status: DC
  Administered 2011-06-30: 2 mg via ORAL
  Filled 2011-06-30: qty 1

## 2011-06-30 MED ORDER — CLONIDINE HCL 0.1 MG PO TABS
0.1000 mg | ORAL_TABLET | ORAL | Status: AC
  Administered 2011-07-02 – 2011-07-03 (×3): 0.1 mg via ORAL
  Filled 2011-06-30 (×4): qty 1

## 2011-06-30 NOTE — Progress Notes (Signed)
  Pt. Has been resting quietly since writer came onto shift.  Writer did awaken pt. To check his BP and give him his HS medications.  Pt. Had no complaints of pain or discomfort.  Pt. Did not want to interact due to being tired and sleepy, states he is catching up on his rest.  Will continue to monitor pt. For safety.

## 2011-06-30 NOTE — H&P (Signed)
Brian [name] is an 25 y.o. male.   Chief Complaint: agitation   Past Medical History  Diagnosis Date  . PTSD (post-traumatic stress disorder)   . Anxiety   . Depression     No past surgical history on file.  No family history on file. Social History:  reports that he has been smoking Cigarettes.  He does not have any smokeless tobacco history on file. 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 He reports that he does not drink alcohol.  Allergies:  Allergies  Allergen Reactions  . Dilantin     Gets sick to his stomach    Medications Prior to Admission  Medication Dose Route Frequency Provider Last Rate Last Dose  . acetaminophen (TYLENOL) tablet 650 mg  650 mg Oral Q6H PRN Viviann Spare, NP   650 mg at 06/29/11 1801  . alum & mag hydroxide-simeth (MAALOX/MYLANTA) 200-200-20 MG/5ML suspension 30 mL  30 mL Oral Q4H PRN Viviann Spare, NP      . diphenhydrAMINE (BENADRYL) capsule 50 mg  50 mg Oral Q6H PRN Viviann Spare, NP   50 mg at 06/29/11 1801  . LORazepam (ATIVAN) tablet 2 mg  2 mg Oral Once Forbes Cellar, MD   2 mg at 06/28/11 1755  . magnesium hydroxide (MILK OF MAGNESIA) suspension 30 mL  30 mL Oral Daily PRN Viviann Spare, NP      . nicotine (NICODERM CQ - dosed in mg/24 hours) patch 21 mg  21 mg Transdermal Daily Katheren Puller, MD   21 mg at 06/30/11 0635  . risperiDONE (RISPERDAL) tablet 1 mg  1 mg Oral Q6H PRN Viviann Spare, NP   1 mg at 06/30/11 1610  . traZODone (DESYREL) tablet 100 mg  100 mg Oral QHS PRN Viviann Spare, NP      . DISCONTD: acetaminophen (TYLENOL) tablet 650 mg  650 mg Oral Q4H PRN Forbes Cellar, MD   650 mg at 06/29/11 1148  . DISCONTD: alum & mag hydroxide-simeth (MAALOX/MYLANTA) 200-200-20 MG/5ML suspension 30 mL  30 mL Oral PRN Forbes Cellar, MD      . DISCONTD: haloperidol lactate (HALDOL) injection 5 mg  5 mg Intramuscular Once Forbes Cellar, MD      . DISCONTD: ibuprofen  (ADVIL,MOTRIN) tablet 600 mg  600 mg Oral Q8H PRN Forbes Cellar, MD   600 mg at 06/29/11 1154  . DISCONTD: LORazepam (ATIVAN) tablet 1 mg  1 mg Oral Q8H PRN Forbes Cellar, MD   1 mg at 06/29/11 1356  . DISCONTD: nicotine (NICODERM CQ - dosed in mg/24 hours) patch 21 mg  21 mg Transdermal Daily Forbes Cellar, MD   21 mg at 06/29/11 0845  . DISCONTD: ondansetron (ZOFRAN) tablet 4 mg  4 mg Oral Q8H PRN Forbes Cellar, MD      . DISCONTD: zolpidem (AMBIEN) tablet 5 mg  5 mg Oral QHS PRN Forbes Cellar, MD       Medications Prior to Admission  Medication Sig Dispense Refill  . HYDROcodone-acetaminophen (VICODIN) 5-500 MG per tablet Take 1 tablet by mouth 3 (three) times daily as needed. For back pain        Results for orders placed during the hospital encounter of 06/28/11 (from the past 48 hour(s))  CBC     Status: Normal   Collection Time   06/28/11  4:02 PM      Component Value Range Comment   WBC 7.6  4.0 - 10.5 (  K/uL)    RBC 5.24  4.22 - 5.81 (MIL/uL)    Hemoglobin 15.6  13.0 - 17.0 (g/dL)    HCT 45.4  09.8 - 11.9 (%)    MCV 85.3  78.0 - 100.0 (fL)    MCH 29.8  26.0 - 34.0 (pg)    MCHC 34.9  30.0 - 36.0 (g/dL)    RDW 14.7  82.9 - 56.2 (%)    Platelets 260  150 - 400 (K/uL)   COMPREHENSIVE METABOLIC PANEL     Status: Normal   Collection Time   06/28/11  4:02 PM      Component Value Range Comment   Sodium 137  135 - 145 (mEq/L)    Potassium 4.2  3.5 - 5.1 (mEq/L)    Chloride 104  96 - 112 (mEq/L)    CO2 25  19 - 32 (mEq/L)    Glucose, Bld 79  70 - 99 (mg/dL)    BUN 11  6 - 23 (mg/dL)    Creatinine, Ser 1.30  0.50 - 1.35 (mg/dL)    Calcium 86.5  8.4 - 10.5 (mg/dL)    Total Protein 7.8  6.0 - 8.3 (g/dL)    Albumin 4.6  3.5 - 5.2 (g/dL)    AST 24  0 - 37 (U/L)    ALT 24  0 - 53 (U/L)    Alkaline Phosphatase 56  39 - 117 (U/L)    Total Bilirubin 0.3  0.3 - 1.2 (mg/dL)    GFR calc non Af Amer >90  >90 (mL/min)    GFR calc Af Amer >90  >90 (mL/min)   ETHANOL     Status: Normal     Collection Time   06/28/11  4:02 PM      Component Value Range Comment   Alcohol, Ethyl (B) <11  0 - 11 (mg/dL)   ACETAMINOPHEN LEVEL     Status: Normal   Collection Time   06/28/11  4:02 PM      Component Value Range Comment   Acetaminophen (Tylenol), Serum <15.0  10 - 30 (ug/mL)   SALICYLATE LEVEL     Status: Abnormal   Collection Time   06/28/11  4:02 PM      Component Value Range Comment   Salicylate Lvl <2.0 (*) 2.8 - 20.0 (mg/dL)   LIPASE, BLOOD     Status: Normal   Collection Time   06/28/11  4:02 PM      Component Value Range Comment   Lipase 20  11 - 59 (U/L)   URINE RAPID DRUG SCREEN (HOSP PERFORMED)     Status: Normal   Collection Time   06/28/11  4:14 PM      Component Value Range Comment   Opiates NONE DETECTED  NONE DETECTED     Cocaine NONE DETECTED  NONE DETECTED     Benzodiazepines NONE DETECTED  NONE DETECTED     Amphetamines NONE DETECTED  NONE DETECTED     Tetrahydrocannabinol NONE DETECTED  NONE DETECTED     Barbiturates NONE DETECTED  NONE DETECTED     No results found.  Review of Systems  Constitutional: Negative.   HENT: Negative.   Eyes: Negative.   Respiratory: Negative.   Cardiovascular: Negative.   Gastrointestinal: Negative.   Musculoskeletal: Positive for joint pain.  Skin: Negative.   Neurological: Negative.   Psychiatric/Behavioral: Positive for depression, hallucinations and substance abuse. The patient is nervous/anxious.     Blood pressure 153/88, pulse 90, temperature 97.1  F (36.2 C), temperature source Oral, resp. rate 16, height 5\' 11"  (1.803 m), weight 87.544 kg (193 lb). Physical Exam  Constitutional: He is oriented to person, place, and time. He appears well-developed.  HENT:  Head: Normocephalic.  Eyes: Pupils are equal, round, and reactive to light.  Neck: Neck supple.  Cardiovascular: Normal rate and regular rhythm.   Respiratory: Effort normal.  GI: Soft.  Musculoskeletal: Normal range of motion.  Neurological: He is  alert and oriented to person, place, and time. He has normal reflexes.  Skin: Skin is warm.        [name] S 06/30/2011, 10:38 AM

## 2011-06-30 NOTE — Progress Notes (Signed)
Pt. Reports being agitated and request medication.  Writer adm. PRN risperdal.  Pt. Is presently at breakfast.  Will pass info to next shift.

## 2011-06-30 NOTE — Progress Notes (Signed)
Pt was asleep when SW went to discuss d/c planning. Nurse reported that there was permission at nurses station to speak with Brian [name] and Brian [name] but no numbers were provided. Nurse reported that after lunch Brian [name] was agitated and was given some meds to help calm him down. SW will f/u with pt tomorrow morning.Jeannette How, LCSW 11/7/20124:03 PM

## 2011-06-30 NOTE — Progress Notes (Signed)
Patient attended Aftercare Planning Group and sat quietly until called on.  Was informed that his Case Manager will be [facility] and she will check in with him later.  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  He reported last usage 2 weeks ago.  He complained of intense cravings that are making him want to "jump on everybody".  He reported having a difficult time restraining himself and not "putting my hands around that lady's throat", referring to counselor who interviewed him prior to group.  States he has never been through detox before.  Case Manager informed doctor of cravings and request for medication for itching, pain.  Sarina Ser 06/30/2011 9:58 AM

## 2011-06-30 NOTE — Progress Notes (Signed)
Recreation Therapy Group Note  Date: 06/30/2011         Time: 0930      Group Topic/Focus: The focus of this group is on enhancing the patient's understanding of leisure, barriers to leisure, and the importance of engaging in positive leisure activities upon discharge for improved total health.   Participation Level: Appropriate  Participation Quality: Attentive  Affect: Labile  Cognitive: Oriented   Additional Comments: Patient reporting prior to group that he was "withdrawing bad," was visibly shaking but shaking diminished as group continued. Patient made jokes about cocaine use, was easily redirected.   Lasharn Bufkin 06/30/2011 10:05 AM

## 2011-06-30 NOTE — Progress Notes (Signed)
Psychiatric Admission Assessment Adult  Patient Identification:  Brian [name] Date of Evaluation:  06/30/2011 Chief Complaint:Depression  HPI  25 year old male history of postpartum stress disorder, anxiety, question bipolar/hernia presents with multiple complaints. The patient states that he feels very anxious and more depressed the usual. He states that he has not been sleeping for over a month. He states that he wants to hurt himself because life is not worth living anymore secondary to his drug problems. He does state that he has homicidal ideation but is nonspecific stating "I was want to hurt people, it just takes a second just like that." He states that he is seeing hallucinations visual hallucinations though scary face when he closes his eyes and he is occasionally feeling something touches legs when he knows that there is nothing there. He states that he has not taken medication in quite some time he does not known medications he has been on previously although he does state he previously been on antipsychotic medication. He denies any alcohol today. He states that he did look marijuana today. 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   Patient also reported that he is using drugs since the age of 70. The patient has never been in rehabilitation. He has been admitted in psychiatry 3-4 times. Currently patient is going through the withdrawal symptoms. Patient is logical and goal-directed he can make a good conversation. Patient had passive suicidal and homicidal ideations but dad can be because of  his withdrawal symptoms from the drugs heroin and amphetamines. He is unable to tell her what psych medications he was in the past. Patient wants to take some medications as he reported he mood lability and impulsivity a lot.     Review of Systems  All other systems reviewed and are negative.  Negative except as noted history of present illness    Allergies   Dilantin  Home Medications   No current outpatient prescriptions on file. Unable  BP 118/59  Pulse 74  Temp(Src) 98.7 F (37.1 C) (Oral)  Resp 18  SpO2 98%  Physical Exam  Nursing note and vitals reviewed.  Constitutional: He is oriented to person, place, and time. He appears well-developed and well-nourished. No distress.  Appears anxious  HENT:  Head: Atraumatic.  Mouth/Throat: Oropharynx is clear and moist.  Eyes: Conjunctivae and EOM are normal. Pupils are equal, round, and reactive to light.  Neck: Neck supple.  Cardiovascular: Normal rate, regular rhythm, normal heart sounds and intact distal pulses. Exam reveals no gallop and no friction rub.  No murmur heard.  Pulmonary/Chest: Effort normal. No respiratory distress. He has no wheezes. He has no rales.  Abdominal: Soft. Bowel sounds are normal. He exhibits mass. There is no tenderness. There is no rebound and no guarding.  Small supraumbilical mass palpated Min ttp  Musculoskeletal: Normal range of motion. He exhibits no edema and no tenderness.  Neurological: He is alert and oriented to person, place, and time.  Skin: Skin is warm and dry.  Psychiatric: He has a normal mood and affect.     Past Medical History:     Past Medical History  Diagnosis Date  . PTSD (post-traumatic stress disorder)   . Anxiety   . Depression       Allergies:  Allergies  Allergen Reactions  . Dilantin     Gets sick to his stomach    Current Medications:  Prior to Admission medications   Medication Sig Start  Date End Date Taking? Authorizing Provider  HYDROcodone-acetaminophen (VICODIN) 5-500 MG per tablet Take 1 tablet by mouth 3 (three) times daily as needed. For back pain    Historical Provider, MD    Social History:    reports that he has been smoking Cigarettes.  He does not have any smokeless tobacco history on file. 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 He reports  that he does not drink alcohol.   Family History:    No family history on file.  Mental Status Examination/Evaluation: Objective:  Appearance: Fairly Groomed  Patent attorney::  Fair  Speech:  Normal Rate  Volume:  Normal  Mood:  depressed  Affect:  Full Range  Thought Process:  Linear  Orientation:  Full  Thought Content: Patient reported visual hallucinations but he is  logical and goal-directed this is because of his withdrawal symptoms from the drugs patient does not seem to be internally preoccupied.   Suicidal Thoughts:  No  Homicidal Thoughts:  Yes.  without intent/plan syndrome related to his withdrawal symptoms.   Judgement:  Intact  Insight:  Good  Psychomotor Activity:  Increased            Assessment:  Axis I: Psychotic Disorder NOS  AXIS I Psychotic Disorder NOS polysubstance dependence   AXIS II Deferred  AXIS III No active issue  AXIS IV housing problems and problems related to social environment  AXIS V 31-40 impairment in reality testing     Treatment Plan Summary:  #1 patient will be started on clonidine detox protocol. #2 patient will be started on Risperdal 2 mg at bedtime to control his mood lability and psychotic symptoms related to drug abuse. #3 estimated length of stay in the hospital to be 3-4 days. #4 the patient will be also referred to the rehabilitation place. Charnise Lovan S 11/6/201211:40 AM

## 2011-06-30 NOTE — Progress Notes (Signed)
[facility] Group Notes:  (Counselor/Nursing/MHT/Case Management/Adjunct)  06/30/2011 3:36 PM  Type of Therapy:  Group Therapy  Participation Level:  Minimal  Participation Quality:  Attentive  Affect:  Anxious and Irritable  Cognitive:  Oriented  Insight:  Limited  Engagement in Group:  Limited  Engagement in Therapy:  Limited  Modes of Intervention:  Education and Support  Summary of Progress/Problems: Patient was anxious and agitated. He stated that he needed something for his withdrawal from drugs.   Brian [name] 06/30/2011, 3:36 PM

## 2011-07-01 MED ORDER — BENZTROPINE MESYLATE 0.5 MG PO TABS
ORAL_TABLET | ORAL | Status: AC
  Administered 2011-07-01: 18:00:00
  Filled 2011-07-01: qty 1

## 2011-07-01 MED ORDER — LORAZEPAM 1 MG PO TABS
1.0000 mg | ORAL_TABLET | ORAL | Status: DC | PRN
  Administered 2011-07-01 – 2011-07-02 (×3): 1 mg via ORAL
  Filled 2011-07-01 (×5): qty 1

## 2011-07-01 MED ORDER — CHLORPROMAZINE HCL 25 MG PO TABS: 25.0000 mg | ORAL_TABLET | ORAL | Status: AC

## 2011-07-01 MED ORDER — HALOPERIDOL 2 MG PO TABS
2.0000 mg | ORAL_TABLET | Freq: Every day | ORAL | Status: DC
  Administered 2011-07-02 – 2011-07-03 (×2): 2 mg via ORAL
  Filled 2011-07-01 (×3): qty 1
  Filled 2011-07-01: qty 2
  Filled 2011-07-01: qty 1

## 2011-07-01 MED ORDER — HALOPERIDOL 5 MG PO TABS
5.0000 mg | ORAL_TABLET | Freq: Four times a day (QID) | ORAL | Status: DC | PRN
  Administered 2011-07-01 – 2011-07-03 (×3): 5 mg via ORAL
  Filled 2011-07-01 (×2): qty 1

## 2011-07-01 MED ORDER — DIPHENHYDRAMINE HCL 50 MG/ML IJ SOLN
50.0000 mg | Freq: Two times a day (BID) | INTRAMUSCULAR | Status: DC | PRN
Start: 2011-07-01 — End: 2011-07-17
  Administered 2011-07-01 – 2011-07-04 (×4): 50 mg via INTRAMUSCULAR
  Filled 2011-07-01 (×4): qty 1

## 2011-07-01 MED ORDER — DIPHENHYDRAMINE HCL 50 MG/ML IJ SOLN
50.0000 mg | Freq: Once | INTRAMUSCULAR | Status: AC
  Administered 2011-07-01: 50 mg via INTRAMUSCULAR

## 2011-07-01 MED ORDER — DIPHENHYDRAMINE HCL 50 MG/ML IJ SOLN
INTRAMUSCULAR | Status: AC
  Administered 2011-07-01: 50 mg via INTRAMUSCULAR
  Filled 2011-07-01: qty 1

## 2011-07-01 MED ORDER — HALOPERIDOL 5 MG PO TABS
5.0000 mg | ORAL_TABLET | Freq: Every day | ORAL | Status: DC
  Administered 2011-07-01: 5 mg via ORAL
  Filled 2011-07-01 (×4): qty 1

## 2011-07-01 MED ORDER — BENZTROPINE MESYLATE 0.5 MG PO TABS
0.5000 mg | ORAL_TABLET | ORAL | Status: AC
  Administered 2011-07-01: 0.5 mg via ORAL

## 2011-07-01 MED ORDER — DIPHENHYDRAMINE HCL 50 MG PO CAPS
50.0000 mg | ORAL_CAPSULE | Freq: Every day | ORAL | Status: DC
  Administered 2011-07-01: 50 mg via ORAL
  Filled 2011-07-01 (×2): qty 1

## 2011-07-01 NOTE — Progress Notes (Signed)
SW attempted to call Brian [name] to gain some information about a d/c plan but was unable to get in contact with her due to the phone number listed not receiving incoming calls. SW got report form staff that he was agitated this morning and needed meds to calm down.  SW has not actually made face to face contact with Brian [name] at this time due to him due to continued agitation. Jeannette How, LCSW 07/01/2011 11:15 AM

## 2011-07-01 NOTE — Progress Notes (Signed)
[facility] Group Notes:  (Counselor/Nursing/MHT/Case Management/Adjunct)  07/01/2011 2:17 PM  Type of Therapy:  Group Therapy  Participation Level:  Did Not Attend  Brian [name] 07/01/2011, 2:17 PM

## 2011-07-01 NOTE — Progress Notes (Addendum)
  CC: "my throat is closing up"  Brian [name] seen at 0755am at the request of Latoya, mental health tech. Says he has had this before.  Pulse 147, and pulse ox 100%.  His face is red, he is crying and anxious, and grasping at my lab coat.   Exam of his mouth shows no signs of redness, swelling, angioedema or dystonia, tongue is midline.   I ordered 50mg  Benadryl IM stat and 1 mg Ativan po stat.  He is able to calm down and allow me to examine him further and I find no signs of dystonia, rigidity or cogwheel.    He is fully oriented and makes no delusional statements. Says this has happened before to him and it is from the Risperdal.   P.  Has received meds and will continue to observe.  I have stopped his Risperdal and will speak with Dr. Rogers Blocker regarding his treatment plan going forward.     0915  Brian [name] is asleep and awakens easily.  He reports he feels much better.  Says his throat had gone numb. He was very scared.  It has resolved and he feels fine now.  Discussed discontinuing the Risperdal.  Says he doesn't want to take it.  We reviewed several antipsychotics and he believes he has taken Haldol before and is willing to take it.    Pulse at this time 84 apically, regular with no extra sounds.  Lungs are clear, R 18, he is breathing easy.    P. Discussed with Dr. Rogers Blocker and will give Haldol 2.5mg  q am, Haldol 5mg  PO qhs, and Haldol 5mg  PO q 6 hours  Prn psychosis.

## 2011-07-01 NOTE — Progress Notes (Signed)
Suicide Risk Assessment  Admission Assessment     Demographic factors:  Assessment Details Time of Assessment: Admission Information Obtained From: Patient Current Mental Status:  Current Mental Status: Suicidal ideation indicated by patient Loss Factors:  Loss Factors: Loss of significant relationship Historical Factors:  Historical Factors: Prior suicide attempts Risk Reduction Factors:  Risk Reduction Factors: Responsible for children under 68 years of age  CLINICAL FACTORS:   Severe Anxiety and/or Agitation  COGNITIVE FEATURES THAT CONTRIBUTE TO RISK:  none  SUICIDE RISK:   Moderate:  Frequent suicidal ideation with limited intensity, and duration, some specificity in terms of plans, no associated intent, good self-control, limited dysphoria/symptomatology, some risk factors present, and identifiable protective factors, including available and accessible social support.  PLAN OF CARE:   Brian [name] 07/01/2011, 2:17 PM

## 2011-07-01 NOTE — Progress Notes (Signed)
Patient was extremely agitated after returning from breakfast this morning.  He had pressured speech, was yelling at staff, and threatening staff.  He was given a scheduled dose of clonidine and a PRN dose of Benadryl at his request.  Brian [name] then saw the patient and ordered another dose of Benadryl to be given IM as she believed him to be having EPS symptoms.  He also received 1 mg of lorazepam PO which he allowed to dissolve in his mouth.  He calmed down quickly after receiving the medications and stayed in his room for the rest of the morning.  At lunch time he complained of visual hallucinations and was given 5 mg of haloperidol PO and another lorazepam.  Patient stated after lunch that his agitation increases when he is around a lot of people.  He was given permission to stay in his room for the rest of the day if it was easier for him.  Since the incident this morning he has been cooperative with the staff.

## 2011-07-02 DIAGNOSIS — F4310 Post-traumatic stress disorder, unspecified: Secondary | ICD-10-CM

## 2011-07-02 MED ORDER — DIPHENHYDRAMINE HCL 50 MG PO CAPS
100.0000 mg | ORAL_CAPSULE | Freq: Every day | ORAL | Status: DC
  Administered 2011-07-02: 100 mg via ORAL
  Administered 2011-07-04 (×2): 50 mg via ORAL
  Administered 2011-07-04 – 2011-07-16 (×12): 100 mg via ORAL
  Filled 2011-07-02 (×20): qty 2

## 2011-07-02 MED ORDER — HALOPERIDOL 5 MG PO TABS
10.0000 mg | ORAL_TABLET | Freq: Every day | ORAL | Status: DC
  Administered 2011-07-02: 10 mg via ORAL
  Filled 2011-07-02 (×4): qty 2

## 2011-07-02 NOTE — Progress Notes (Signed)
Pt attended discharge planning group and actively participated. SW presents with irritated affect and mood.  SW met with pt individually on this date.  Pt responded to questions asked but did was irritated and appeared to not want to be bothered.  Pt did say his guardian is Marlan Palau and identifies as "she is supposed to be my mother".  Pt was scratching self and SW asked what was going on.  Pt said depression and hearing voices.  SW asked where pt lives and he states he was living with his guardian but is now homeless.  Pt asked if he gets to leave today.  SW explained [facility] referral would be made and he would not be leaving.  SW made [facility] referral.  SW will continue to assess pt's needs.    Reyes Ivan, LCSWA 07/02/2011  1:21 PM

## 2011-07-02 NOTE — BH Assessment (Signed)
Patient resting quietly with eyes closed. Respirations even and unlabored. No distress noted.

## 2011-07-02 NOTE — Progress Notes (Signed)
  Patient has a flat affect on approach this am. States his main complaint is that he has been hearing voices and wants help with that. Reports continued depression "10" and feelings of hopelessness "10". Reports some SI in the past 24 hrs. He wants to try to stop taking drugs. Took medications without difficulty this am but states he took a med yesterday that caused him a reaction. Staff will monitor and encourage group attendance.

## 2011-07-02 NOTE — Progress Notes (Signed)
  Patient has been in bed for majority of evening today. Did not attend group. Patient had dose of thorazine ordered for 1815 today but was not given by previous shift because of lack of medication on the unit. This nurse called previous shift nurse to question medication's scheduled x1 dose and then MD was notified by this nurse around 2100 that dose was not given. Patient had been in bed, calm, resting since beginning of shift. BP was received and sitting was 106/51 with standing 123/78. Patient took all medications this evening without incident.

## 2011-07-02 NOTE — Progress Notes (Signed)
  Patient has a flat affect on approach today. States that hearing the voices is what bothers him the most. Passive SI on and off. Says that he has been this way for years. He feels hopeless about his condition. He wants to stop taking drugs and go to a long term treatment facility. He says he has these bad thought and doesn't want to hurt someone and go to jail. Taking medications as prescibed. Did take an ativan this afternoon. States being around a lot of people makes him nervous and he feels like he is having panic attack. Staff will continue to monitor and encourage group attendance when able to tolerate them.

## 2011-07-02 NOTE — Progress Notes (Signed)
Patient reported hearing multiple voices and unable to sleep at bedtime. He is also anxious and agitated on and off. He still have passive suicidal ideations. But denies any homicidal ideations. He does not seem to be internally preoccupied he can make good conversation.  I discussed with him the plan of sending him to the rehabilitation. Patient agrees to go to [place].  I increased his Haldol from 5 mg to 10 mg at bedtime.  Psychoeducation given to the patient regarding the medications and about rehabilitation.

## 2011-07-02 NOTE — Progress Notes (Signed)
[facility] Group Notes:  (Counselor/Nursing/MHT/Case Management/Adjunct)  07/02/2011 3:09 PM  Type of Therapy:  Group Therapy  Participation Level:  Did Not Attend  Veto Kemps 07/02/2011, 3:09 PM
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
## 2011-07-03 MED ORDER — ZIPRASIDONE MESYLATE 20 MG IM SOLR
INTRAMUSCULAR | Status: AC
  Administered 2011-07-03: 20 mg via INTRAMUSCULAR
  Filled 2011-07-03: qty 20

## 2011-07-03 MED ORDER — BENZTROPINE MESYLATE 1 MG PO TABS
1.0000 mg | ORAL_TABLET | Freq: Once | ORAL | Status: AC
  Administered 2011-07-04: 1 mg via ORAL
  Filled 2011-07-03: qty 1

## 2011-07-03 MED ORDER — BENZTROPINE MESYLATE 1 MG PO TABS
1.0000 mg | ORAL_TABLET | Freq: Two times a day (BID) | ORAL | Status: DC
  Administered 2011-07-03 – 2011-07-15 (×24): 1 mg via ORAL
  Filled 2011-07-03 (×27): qty 1

## 2011-07-03 MED ORDER — ZIPRASIDONE MESYLATE 20 MG IM SOLR: 20.0000 mg | Freq: Once | INTRAMUSCULAR | Status: DC

## 2011-07-03 NOTE — Progress Notes (Signed)
[facility] Adult Inpatient Family/Significant Other Suicide Prevention Education  Suicide Prevention Education:  Education Completed; Consuella Lose Biancardi-[phone], has been identified by the patient as the family member/significant other with whom the patient will be residing, and identified as the person(s) who will aid the patient in the event of a mental health crisis (suicidal ideations/suicide attempt).  With written consent from the patient, the family member/significant other has been provided the following suicide prevention education, prior to the and/or following the discharge of the patient.  The suicide prevention education provided includes the following:  Suicide risk factors  Suicide prevention and interventions  National Suicide Hotline telephone number  [facility] assessment telephone number  [facility] Emergency Assistance 911  [facility] and/or Residential Mobile Crisis Unit telephone number  Request made of family/significant other to:  Remove weapons (e.g., guns, rifles, knives), all items previously/currently identified as safety concern.    Remove drugs/medications (over-the-counter, prescriptions, illicit drugs), all items previously/currently identified as a safety concern.  The family member/significant other verbalizes understanding of the suicide prevention education information provided.  The family member/significant other agrees to remove the items of safety concern listed above.  Pt. Mother concerns were that the pt. get long term treatment longer that 2 weeks. Pt. Mother states the pt. was homeless and states he could return to live with her if the doctor from the long term treatment facility call her and verify that pt. had received the help he needs. Pt.'s  mother states that pt. has been lying to [facility] stafff about being in the [employer] and about her being his guardian. Pt.'s mother was told that when asked about guardian, the pt. gave the correct  name and number today when asked by the counselor.  Pt. Mother states their are no weapons in her home or any medications the pt. could harm himself with.  The pt.'s mother states she is the main support for the pt. And that the pt.'s father does not have anything to do with the pt. Pt.'s mother states that the pt. Is unable to hold a job due to his anger problems and that his behavior has been going on for about 8 years.  Pt. states the other contact person the pt. Gave Reggie Randa Evens is a friend that the pt. Knows. Pt.'s  mother can be reached at the contact number above.  Lamar Blinks Jeneen 07/03/2011, 11:09 AM

## 2011-07-03 NOTE — Progress Notes (Signed)
[facility] Adult Inpatient Family/Significant Other Suicide Prevention Education  Suicide Prevention Education:  Contact Attempts: Reggie Ahlers [phone],  has been identified by the patient as the family member/significant other with whom the patient will be residing, and identified as the person(s) who will aid the patient in the event of a mental health crisis.  With written consent from the patient, two attempts were made to provide suicide prevention education, prior to and/or following the patient's discharge.  We were unsuccessful in providing suicide prevention education.  A suicide education pamphlet was given to the patient to share with family/significant other.  Date and time of first attempt: 07/03/11 at 8:30 a.m. by Lamar Blinks Date and time of second attempt:  Neila Gear 07/03/2011, 10:59 AM

## 2011-07-03 NOTE — Progress Notes (Signed)
Pt was lying in bed upon first assessment.  He did admit he went down for breakfast and was just lying down.  Gave him his self-inventory to fill out.  He rated both hid depression and hopelessness a 10 today with passive S/I.  He does contract for safety on the unit.  His room was noted to be very hot and he was given his am meds at his bedside.  He had his whole body and head covered up.  He did admit to hearing voices but would not elaborate on anything they were saying at all.  He did walk up the hall around 1017 and c/o his throat feeling as if it was closing up on him.  He was given benadryl 50 mg IM as ordered which he was relived when checked back on him in 20 minutes.   He then came back up to the nurses station and asked for something for withdrawals.  He was given naproxen and robaxin as ordered prn for his c/o of neck pain 10 of 10 at 1147 (see pain section).  He was lined up to go down for lunch.  Np talked about adding cogentin or something of that nature to his scheduled haldol doses.

## 2011-07-03 NOTE — Progress Notes (Signed)
Patient began asking for a shot for his neck (PSE) from medication he had taken earlier, MD had ordered Cogentin and this was given around 1500, he then went to bed, got up and came to the window asking for another shot of Benadryl, was not given a shot, was given haldol 5 mg po, while he was taking the meds, he chewed up the pills and stated his head/neck was better, and thanked this nurse for the help, he then stated he needed another shot, PA was called to speak w/patient, ordered 50 of benadryl po, this was given, again he asked PA for shot, she stated know, he then went to his room PA and this nurse tried to speak w/him and inform his neck is ok, his o2 sat was 99% and he was doing well, he called this nurse a "MF cunt" several times, would not take his hands out of the med door, was taken to room, asked for order for Geodon 20 mg IM and this was given by Karoline Caldwell, presently he is asleep in his bed, breathing well, voicing no complaints, patient is now a 1:1 for his safety.

## 2011-07-03 NOTE — Progress Notes (Signed)
[facility] Adult Inpatient Family/Significant Other Suicide Prevention Education  Suicide Prevention Education:  Contact Attempts: Elias Dennington) [phone],  has been identified by the patient as the family member/significant other with whom the patient will be residing, and identified as the person(s) who will aid the patient in the event of a mental health crisis.  With written consent from the patient, two attempts were made to provide suicide prevention education, prior to and/or following the patient's discharge.  We were unsuccessful in providing suicide prevention education.  A suicide education pamphlet was given to the patient to share with family/significant other.  Date and time of first attempt: 07/03/11  At 10:55 a.m. By Lamar Blinks Date and time of second attempt:  Neila Gear 07/03/2011, 11:07 AM

## 2011-07-03 NOTE — Progress Notes (Signed)
[facility] Group Notes:  (Counselor/Nursing/MHT/Case Management/Adjunct)  07/03/2011 11:59 AM  Type of Therapy:  Counseling Group / Dance/Movement Therapy  Participation Level:  Did Not Attend  Kym Groom 07/03/2011, 12:01 PM

## 2011-07-03 NOTE — Progress Notes (Signed)
  Devario is reporting sweating, possibly stating having some withdrawal symptoms from heroin and methamphetamine use. Feels that his throat is closing. Received haldol 2 mg this morning. Has taken this medication prior. Currently on clonidine protocol.  Denies any psychotic symptoms, denies suicidal or homicidal thinking. Offers little information.  Obj: Alert, poor eye contact. Casually dressed. Facial sweating also noted on back of shirt. Nurses report patients room very hot.  Plan: Received Benadryl IM for possible EPS. Add Cogentin with Haldol dosing.           Monitor withdrawal symptoms.

## 2011-07-04 MED ORDER — ZIPRASIDONE MESYLATE 20 MG IM SOLR
20.0000 mg | Freq: Two times a day (BID) | INTRAMUSCULAR | Status: DC | PRN
  Administered 2011-07-11 – 2011-07-14 (×4): 20 mg via INTRAMUSCULAR
  Filled 2011-07-04 (×5): qty 20

## 2011-07-04 MED ORDER — QUETIAPINE FUMARATE 50 MG PO TABS
50.0000 mg | ORAL_TABLET | Freq: Three times a day (TID) | ORAL | Status: DC
  Administered 2011-07-04 – 2011-07-05 (×4): 50 mg via ORAL
  Filled 2011-07-04 (×6): qty 1

## 2011-07-04 NOTE — Progress Notes (Signed)
1:1 1810 for  1630 D  Pt has been sleeping quietly since he received Seroquel at 1230 today. He has 1 : 1 MHT at his bedside. He is breathing evenly and calmly and lying still. A He is in NAD.  R Safety is maintained and POC includes maintenance of therapeutic relationship , administration of meds as well as assessment of pt's response to these interventions. PD RN [facility]

## 2011-07-04 NOTE — Progress Notes (Signed)
Patient seen today in his room he is on one to one for his own safety and also for unpredictable behavior. Yesterday he was given Benadryl shot when he complained of throat pain and neck pain. Earlier he was given Haldol that may have caused extrapyramidal side effects, he was also given Cogentin and later he was given Geodon milligram IM for extreme restlessness agitation and anger. Apparently Geodon helped him and patient has been much calmer but he continued to endorse paranoid thinking restlessness and remains unpredictable. He admitted that he have this neck pain also in the past but did not remember what caused that. Recently he is not been aggressive or violent but remains unpredictable and very anxious. He denies any chest pain swelling shortness or breath or difficulty in talking. Patient has history of drug use and admitted he has use methamphetamine 2 weeks ago. I have reviewed his vitals and lab results. Mental Status examination, patient is restless anxious maintained a poor eye contact he has tattoos on his neck. He is superficially cooperative and at times unpredictable. His speech is soft and coherent. He described his mood is angry and irritable. He endorse orderly hallucinations and is still vague homicidal thoughts toward people but he denies any plan. He still had passive suicidal thinking. His thought processes circumstantial. He appears hypervigilant and paranoid. He's alert and oriented x3. Plan we will discontinue Haldol and start Seroquel 50 mg 3 times a day for now he will continue on Cogentin and given Geodon for extreme agitation. I will keep him on one-to-one for safety and unpredictable behavior. I have explained the risks and benefits of medication in detail.Marland Kitchen

## 2011-07-04 NOTE — Progress Notes (Signed)
1:1 for 0800 D Pt is asleep in his bed. He is resting quietly. A Pt safety is maintained. R POC includes maintenance of 1: 1 to assure pt safety. PD RN [facility]

## 2011-07-04 NOTE — Progress Notes (Signed)
[facility] Group Notes:  (Counselor/Nursing/MHT/Case Management/Adjunct)  07/04/2011 11:00 AM  Type of Therapy:  Counseling / Dance/Movement Therapy  Participation Level:  Did Not Attend  Brian [name] 07/04/2011, 11:36 AM    [facility] Group Notes:  (Counselor/Nursing/MHT/Case Management/Adjunct)  07/04/2011 11:00 AM  Type of Therapy:  Counseling / Dance/Movement Therapy  Participation Level:  Did Not Attend  Brian [name] 07/04/2011, 11:36 AM

## 2011-07-04 NOTE — Progress Notes (Signed)
  Has been resting tonight since geodon injection received on previous shift.  Resting peacefully, eyes closed, snoring softly, no c/o's voiced tonight.  Remains on 1:1 obs d/t behavior on previous shift, mht at bedside.  Will continue to monitor.

## 2011-07-04 NOTE — Progress Notes (Signed)
1 : 1 D Pt is OOB UAL on the 400 hall tolerated fair. He is currently visiting with his mother, focused on wanting to be discharged today. He is anxious, he has difficulty retaining and processing information. He demonstrates short term memory deficits. He endorses no insight. He says, " Just give me a shot I  just want a shot". A He remains on  A 1:1 per MD order. His MD DC'd his haldol today and started him on Seroquel 50 mg PO tid. He took first dose at 1200 without difficulty. R Safety is maintained and POC includes therapeutic alliance, frequent assessment of pt's needs ( to minimize escalation) and continuation of 1:1. PD RN [facility]

## 2011-07-04 NOTE — Progress Notes (Signed)
  1930 D Quinnlan has slept off and on the majority of te day. HE got up.Marland Kitchenaound lunch and visited with his mother. He has ha a hard time understanding that he cannot just sign out of this hospital " AMA"... He is guarded, makes poor eye contact. HE is demanding, has poor impulse control and wants immediate fix for whatever ails him.  AHe remained compliant with all of his emds today. His haldol was dc'd and he was started on seroquel, which has  ahd a pos effect on his mood. R Safety is maintaine dand POC includes maintenance of 1 ;1 to keep pt and staff safe. PD RN [facility]

## 2011-07-05 MED ORDER — QUETIAPINE FUMARATE 50 MG PO TABS
50.0000 mg | ORAL_TABLET | Freq: Three times a day (TID) | ORAL | Status: DC | PRN
  Administered 2011-07-05 – 2011-07-16 (×25): 50 mg via ORAL
  Filled 2011-07-05 (×25): qty 1

## 2011-07-05 MED ORDER — QUETIAPINE FUMARATE 100 MG PO TABS
100.0000 mg | ORAL_TABLET | Freq: Three times a day (TID) | ORAL | Status: DC
  Administered 2011-07-05 – 2011-07-06 (×2): 100 mg via ORAL
  Filled 2011-07-05 (×3): qty 1

## 2011-07-05 NOTE — Progress Notes (Signed)
Pt. Has remained in bed resting quietly since Clinical research associate arrived.  Pt. Is a 1:1 due to inappropriate behavior.  Writer will continue to assess pt. And note will be written on the 1:1 board that accompanies the patient.

## 2011-07-05 NOTE — Progress Notes (Signed)
[facility] Group Notes:  (Counselor/Nursing/MHT/Case Management/Adjunct)  07/05/2011 3:18 PM  Type of Therapy:  Group Therapy  Participation Level:  Minimal  Participation Quality:  Attentive and Sharing  Affect:  Anxious  Cognitive:  Oriented  Insight:  Limited  Engagement in Group:  Limited  Engagement in Therapy:  Limited  Modes of Intervention:  Education and Support  Summary of Progress/Problems: Patient was able to remain in the group the entire session. He appeared calmer and was complimented on this. He asked about going to SA treatment. Informed him that he would need to have a decrease in hallucinations or elimination of voices, mood would have to be more stable with less agitation. He continues to report hearing voices. Also reporting withdrawal and stated that the medications were not enough.   Brian [name] 07/05/2011, 3:18 PM

## 2011-07-05 NOTE — Progress Notes (Signed)
Still labile,agitated on and off, reported hearing voices, have cravings but able to make conversation. Will increase seroquel to 100mg  tid. haldol was discontinued on the weekend psycho education to the patient.

## 2011-07-05 NOTE — BH Assessment (Signed)
Has been in bed most of the evening, dozing at intervals.  Has been restless at times, tossing and turning frequently.  Upset with meds initially, stated he was achey and was going through w//d sx,  C/o stomach cramping.  Was given bentyl, naproxen,  Robaxin and trazadone, which seemed to help, and appeared to be sleeping when reassessed.  Will continue to monitor for safety.

## 2011-07-05 NOTE — Progress Notes (Signed)
Pt attended discharge planning group and actively participated. SW met with pt individually on this date.  SW asked pt if he was feeling better.  Pt explained that on Saturday he had an allergic reaction to his medication, causing him to act out but he has been doing better since then.  SW was able to discuss [facility] referral that was made.  Pt inquired about how he weill get there and how he will able to get medication after d/c, as he can never afford it after leaving the hospital.  SW explained the sheriff will take him to [facility] and [facility] can assist with meds.  Pt states that he can go back home after he completes treatment.  Pt expressed concern that he may have warrants out and asked if the sheriff will arrest him when he transports pt there.  SW stated that SW would look into this and if he has warrants out.  Pt presents with calmer mood and affect.  SW was able to carry a conversation with pt.  Pt's mood appears to be improving.  SW will continue to assess when [facility] treatment bed is available.    Brian [name], LCSWA 07/05/2011  1:26 PM

## 2011-07-06 DIAGNOSIS — F1920 Other psychoactive substance dependence, uncomplicated: Secondary | ICD-10-CM | POA: Diagnosis present

## 2011-07-06 DIAGNOSIS — F29 Unspecified psychosis not due to a substance or known physiological condition: Secondary | ICD-10-CM | POA: Diagnosis present

## 2011-07-06 MED ORDER — QUETIAPINE FUMARATE 300 MG PO TABS
300.0000 mg | ORAL_TABLET | Freq: Every day | ORAL | Status: DC
  Administered 2011-07-06 – 2011-07-13 (×8): 300 mg via ORAL
  Filled 2011-07-06 (×9): qty 1

## 2011-07-06 NOTE — Progress Notes (Signed)
[facility] Group Notes:  (Counselor/Nursing/MHT/Case Management/Adjunct)  07/06/2011 2:14 PM  Type of Therapy:  Group Therapy  Participation Level:  Minimal  Participation Quality:  Attentive  Affect:  Flat  Cognitive:  Oriented  Insight:  None  Engagement in Group:  Limited  Engagement in Therapy:  Limited  Modes of Intervention:  Education and Support  Summary of Progress/Problems: Patient stated that he is not any better. Continues to feel anxious. Stated that he had told doctor he needed different medications. Only focused on medications.   Crystallee Werden, Aram Beecham 07/06/2011, 2:14 PM

## 2011-07-06 NOTE — Progress Notes (Signed)
Pt has been on 1:1 so most notes are in the paper chart.  His 1:1 was d/c'd at 0939 this morning and now all f/u notes are finished and placed in his chart.

## 2011-07-06 NOTE — Tx Team (Signed)
Interdisciplinary Treatment Plan Update (Adult)  Date:  07/06/2011  Time Reviewed:  9:43 AM   Progress in Treatment: Attending groups: Yes Participating in groups:  Yes Taking medication as prescribed: Yes Tolerating medication:  Yes Family/Significant othe contact made: Mother Easter Schinke has called but unable to return call - doesn't allow incoming calls Patient understands diagnosis:  Yes Discussing patient identified problems/goals with staff:  Yes Medical problems stabilized or resolved:  Yes Denies suicidal/homicidal ideation: Yes Issues/concerns per patient self-inventory:  None identified Other: N/A  New problem(s) identified: None Identified  Reason for Continuation of Hospitalization: Aggression Anxiety Delusions  Depression Hallucinations Homicidal ideation Medication stabilization Suicidal ideation   Interventions implemented related to continuation of hospitalization: mood stabilization, medication monitoring and adjustment, group therapy and psycho education, safety checks q 15 mins  Additional comments: Team will consider patient going over to 300 hall for groups in 2-3 days, but he is not ready at this time due to delusions/hallucinations, agitation, dissatisfaction with medication  Estimated length of stay:  5 + days  Discharge Plan: [facility] referral has been made, awaiting bed for pt  New goal(s): N/A  Review of initial/current patient goals per problem list:   1.  Goal(s): Depression  Met:  No  Target date: by discharge  As evidenced by: Reduce depression from a 10 to a 3  2.  Goal (s): Suicidal Ideation  Met:  No  Target date: by discharge  As evidenced by: Eliminate suicidal ideation, continues to have SI, HI off and on  3.  Goal(s): Psychosis  Met:  No  Target date: by discharge  As evidenced by: Reduce psychotic symptoms to baseline, patient continues to have voices and delusions   Attendees: Patient:  Did not attend     Family:     Physician:  Eulogio Ditch, MD 07/06/2011 9:43 AM   Nursing:   Robbie Louis, RN   Case Manager:  Reyes Ivan, LCSWA 07/06/2011 9:43 AM   Counselor:  Veto Kemps, MT-BC 07/06/2011 9:43 AM   Other:  Ambrose Mantle, LCSW 07/06/2011 9:43 AM   Other:     Other:     Other:      Scribe for Treatment Team:   Carmina Miller, 07/06/2011, 9:43 AM

## 2011-07-06 NOTE — Progress Notes (Signed)
Pt. Was up in the bathroom at 0600.  Pt. Had a restless night.  Writer encouraged pt. To be active in the milieu more today so he could rest better this pm.  Writer had given all his scheduled meds available for sleep.   Pt. Did request something for anxiety this am. No complaints of pain or discomfort noted at this time.

## 2011-07-06 NOTE — Progress Notes (Signed)
SW spoke with Bluford Kaufmann on this date.  Ms. Maiolo states that she was calling to find out about her son's treatment.  SW explained that the number in the chart was inaccurate and SW has been unable to reach her.  Ms. Letizia correct number is [phone].  Ms. Joshua states that she has been visiting her son during lunch and he said he's going to the state hospital but is scared because he was told the sheriff would transport him there and thinks they will take them straight to jail instead.  SW explained that pt is going to [facility], not the state hospital and that he would not be arrested.  SW asked if Ms. Hendershott was pt's mother or guardian.  Ms. Dinovo states she doesn't know why her son tells people that she is his guardian but she does not have guardianship over him and that she is his biological mother.  Ms. Langston states he even tells people her maiden name instead of [place]ve.  Ms. Sandate states that she has raised her son since birth.    SW asked about pt's history.  Ms. Rappaport states [place], living on the street in [place], [place], [place] and [place].  Ms. Santillana states that he was in jail for awhile at one point.  Ms. Morain states that in late September 2012 he ended up in the hospital in [place] due to a cut on his throat.  Ms. Kubitz stated that it was a bad cut, causing him to be at the medical hospital for a week before being transferred to the mental hospital there.  Ms. Sherrow states that the hospital said it was self inflicted but pt has told different versions of the story, such as him being jumped on the street, owing a drug dealer money and so on.  Ms. Bialas states she is not sure which story is accurate but pt called her asking for help.  Ms. Brayfield states that the d/c plan from there was that they sent him on a bus to  to live with Ms. Persky.  Ms. Bushway states that pt arrived on Oct. 4th.  Ms. Berisha states that he has always had  mental issues, and was on zoloft in the 4th grade for emotional distress, around the time his parents got divorced.  Ms. 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 Steve states he hasn't made it past the 8th grade.  Ms. Preble asked for resources on applying for disability for pt and obtaining Medicaid for pt; SW gave Ms. Ackman the information.  SW stated that SW would be in contact with Ms. Heslin now that SW has correct number.    Reyes Ivan, LCSWA 07/06/2011  1:49 PM

## 2011-07-06 NOTE — Progress Notes (Signed)
Pt attended discharge planning group and actively participated.  Pt presents with calm mood and affect.  SW met with pt individually on this date.  Pt asked SW if he gets to go to [facility] today; SW states he will not go today.  Pt states he called the sheriff dept and asked about his warrants.  Pt states he does have one out for failure to appear, a $200 fine and may have to go to jail.  Pt concerned that if the sheriff comes to transport him to [facility], that they will take him to jail instead.  SW reassured pt this would not happen and that he would have to handle his warrant after d/c from [facility].  SW asked pt for his mother's number, as SW has not been able to return her phone call.  Pt states that she can be reached at [phone].  Pt carried a coversation and was calm during this interaction.    Reyes Ivan, LCSWA 07/06/2011  1:38 PM

## 2011-07-06 NOTE — Progress Notes (Signed)
Pt. Was in his room when writer attempted first assessment.  Writer ask pt. About his day and he stated that it was allright.  Writer discusses pt.'s HS medications and the fact that he would be getting the seroquel at HS in addition to the benadryl.  We also discussed the trazodone and the fact that the pt. Had complained of nightmares so we agreed to hold the trazodone to see if it was needed.  Pt. Was calm and cooperative during the assessment.  Pt. Later became agitated at the tech performing her hall cks... Pt. Told tech " You are not going to do that all night".  Tech ask pt." Do what"? and pt. Stated "Keep opening and closing my door".  Tech informed pt. That it was her job to ck on  him every 15 min. And he could leave the door cracked.  Pt. Replied, "I'm not gonna do that, and you're not gonna open my door".  Tech reported to Retail banker that the pt. Was agitated and writer told tech I would speak with the pt.  Tech came back and stated pt. Told her to "Keep her ass out of here".  Writer attempted to speak with the pt..  Pt. Claimed the tech had made a derrogatory comment to him but could not tell the writer what the comment was.   Pt. Was angry with Clinical research associate for attempting to discuss the situation.  Writer reminded pt. That he had a copy of the treatment agreement and knew that the hall cks had to be performed for his safety.  Pt. Told writer to" just give me my medicine and don't worry about it" .  Writer told pt. To please come to writer if he had a problem with any staff member or any issues and pt. Stated that he could not promise that.  Writer instructed staff member to change halls with another staff member due to pt.'s threat.  Pt. Was calm and went to sleep after this interaction.  Will continue to assess the pt.

## 2011-07-06 NOTE — Progress Notes (Signed)
Patient is feeling better, less anxious, still c/o of poor sleep,still c/o seeing things on the wall, have cravings for drugs. Is not agitated will discontinue 1 to 1.  Changed Seroquel to 300mg  bedtime.

## 2011-07-06 NOTE — Progress Notes (Signed)
;  Pt. Has been in bed all evening but restless throughout the night.  Pt. Sleeps part of the day and is in bed when writer arrives at 19:00.  Pt. Will be encouraged to attend groups and ambulate more this day so he will rest better at night.  NO complaints of pain or discomfort noted at this time. Continue 1:1 for patient safety.

## 2011-07-06 NOTE — Progress Notes (Signed)
Pt. Was up at 0600.  Writer discussed sleep issues with pt.  Pt. Was given all available sleep meds but was restless throughout the night.  Pt. Did request a medication for anxiety.

## 2011-07-07 NOTE — Progress Notes (Signed)
[facility] Group Notes:  (Counselor/Nursing/MHT/Case Management/Adjunct)  07/07/2011 1:37 PM  Type of Therapy:  Group Therapy  Participation Level:  Did Not Attend  Brian [name] 07/07/2011, 1:37 PM

## 2011-07-07 NOTE — Progress Notes (Signed)
  Pt.  Was in dayroom this pm interacting with his peers and with staff.  This is the first evening that pt. Has not been isolating in his bed.  Pt. Still reports a bad day but does not have a response as to what was bad about it or why it was a bad day.   Pt. Did have a journal and was writing down things he wanted to discuss with the MD tomorrow.  Writer praised pt. For this initiative.  Pt. Is still fixated on heroine, cocaine etc. Talking about his cravings and how he has never been without them.  Pt. Wants to be sent to a methadone clinic.  Denies SI, HI and or AH at this time but does talk about his vivid dreams.  Writer encouraged pt. To remove his nicotine patch which he did to see if it could be enhancing his dreams.  We also discussed the effects of trazodone but pt. Wants to go ahead and take it even though it could also be enhancing   The dreams.  Pt. Was calm and cooperative this pm, no complaints of pain or discomfort noted.

## 2011-07-07 NOTE — Progress Notes (Signed)
  Sanchez Hemmer is a 25 y.o. male [id number] 1985-11-28  Subjective/Objective: Patient is agitated on and off, has lability of mood, denies being homicidal today, denies having suicidal ideations.  He reported decrease in voices, but patient can talk logically, does not seem to be internally preoccupied or responding to voices.  He is not disorganized.  Patient does not want rehab, but based upon his long history of drug abuse, and never having rehab, he meets criteria to be committed under IVC for rehab.  Patient reported that he is doing better on Seroquel.  No side effects reported.  We will commit the patient today under IVC for [facility].    History   Social History  . Marital Status: Divorced    Spouse Name: N/A    Number of Children: N/A  . Years of Education: N/A   Social History Main Topics  . Smoking status: Current Everyday Smoker    Types: Cigarettes  . Smokeless tobacco: None  . Alcohol Use: No  . Drug Use: Yes    Special: Methamphetamines, Heroin, Marijuana  . Sexually Active:    Other Topics Concern  . None   Social History Narrative  . None     Lab Results:   BMET    Component Value Date/Time   NA 137 06/28/2011 1602   K 4.2 06/28/2011 1602   CL 104 06/28/2011 1602   CO2 25 06/28/2011 1602   GLUCOSE 79 06/28/2011 1602   BUN 11 06/28/2011 1602   CREATININE 0.81 06/28/2011 1602   CALCIUM 10.0 06/28/2011 1602   GFRNONAA >90 06/28/2011 1602   GFRAA >90 06/28/2011 1602    Medications:  Scheduled:     . benztropine  1 mg Oral BID  . diphenhydrAMINE  100 mg Oral QHS  . nicotine  21 mg Transdermal Daily  . QUEtiapine  300 mg Oral QHS  . ziprasidone  20 mg Intramuscular Once     PRN Meds acetaminophen, alum & mag hydroxide-simeth, diphenhydrAMINE, magnesium hydroxide, QUEtiapine, traZODone, ziprasidone   Plan: Will continue the current treatment plan with referral to [facility] today.     AHLUWALIA,SHAMSHER S 07/07/2011

## 2011-07-07 NOTE — Progress Notes (Signed)
Pt was up in dayroom upon first assessment.  He was given his self-inventory to fill out and rated his depression a 9 and hopelessness a 10 today.  He had been saying both were 10's until today.  He came to medication window for am meds and was still c/o withdrawal symptoms and agitation.  He was given his prn seroquel and he went back into dayroom.  He remained up for discharge planning group and was getting agitated about going to rehab.  He stated, "I would rather leave y'all are making me go to rehab and I don't want to go" but soon after he was yelling this he stated,"they have a f-----g warrant for my arrest and I am going to jail.  He is afraid that while he is being transported to [facility] that he will be taken to jail.  Pt also was asking if the doctor would give him pain medication for his back.  Informed him to speak with the doctor to see what he could order for him and that he does have tylenol ordered for pain.  Once he was told that, he became increasingly agitated but was able to calm down after he was allowed time to vent and talk 1:1 with this nurse.  Pt went to bed and has not been up for any other group or for lunch.  Tried to get him up for lunch but he would not respond.  Latoyia MHT tried to wake him too.  Reminded pt that he was just fussing this morning that he wasn't getting enough to eat.  Pt didn't go down for breakfast and it was reported by the night nurse that she placed his nicotine patch and told him to go down for breakfast.  Pt denied he was ever told that and the MHT's on the hall provided pt with 2 breakfast trays this morning.  Pt had stated,"I am on all this seroquel and it makes me hungry and I have no food to eat"  That was when the discussion had taken place about him going to breakfast and reminded him of snack times.  Pt remains in bed at this time and the MHT's did bring a tray back but have not given to pt yet.  Pt is snoring and still asleep.

## 2011-07-07 NOTE — Progress Notes (Signed)
Met with patient in Aftercare Planning Group.  His affect was somewhat blunted, and he did not appear to be in pain; however, he complained of pain in his back and withdrawal cramping in his ribs.  He stated a number of times that he does not wish to go to rehab and would prefer to check himself out of this hospital.  He came here on a voluntary basis and feels he should have the right to leave.  He stated he does not have a substance abuse problem, that he has only used the substances to deal with his mental problems.  Patient lacks insight and has poor judgment.  At doctor's direction, process was initiated to place patient under Involuntary Commitment.  Ambrose Mantle, LCSW 07/07/2011, 3:17 PM

## 2011-07-08 NOTE — Progress Notes (Signed)
[facility] Group Notes:  (Counselor/Nursing/MHT/Case Management/Adjunct)  07/08/2011 12:13 PM  Type of Therapy:  Group Therapy  Participation Level:  Active  Participation Quality:  Attentive and Sharing  Affect:  Anxious  Cognitive:  Alert and Oriented  Insight:  Limited  Engagement in Group:  Limited  Engagement in Therapy:  Limited  Modes of Intervention:  Clarification, Education, Support and Exploration  Summary of Progress/Problems: Patient was very expressive about his cravings. He talked about living in [place] and how the drugs there were very accessible. [name] then mentioned it was always dark when he lived in [place] and he [place] being near the ocean. He also stated he would often try and use another drug to substitute for one drug and it never seemed to solve any of his problems, but created more problems.   Brian [name], Intern 07/08/2011, 12:13 PM Cosigned by Veto Kemps, MT-BC

## 2011-07-08 NOTE — Discharge Planning (Signed)
Met with patient in Aftercare Planning Group.  He was calmer but displayed pressured speech, grandiosity, flight of ideas, illogical thinking, rumination.  Filled with stories about all the places he has lived in correlation with all the drugs he has done.  When asked directly, states he does want to be sober, but does not believe it is possible because he has been using heavily since age 25 and even now has constant intense cravings.    Our referral to [facility] was denied due to "Pt still has auditory hallucinations, agitation and mood lability.  Not accepted.  It is recommended that Pt's psychosis/agitation be stabilized on psych unit.  Please contact us if these symptoms resolve and you still wish to pursue rehab at [facility]."  This denial was shared with patient, and Case Manager advised him that now the goal will be for him to continue to work on his agitation and the auditory hallucinations while here at the hospital.  If he remains calm, he will probably start to program on the 300 hall soon.  At doctor direction, patient was referred to [facility].  Apparently patient called his mother and told her that [facility] is now going to send him to the state hospital for a year.  Counselor will be calling her back.  Ambrose Mantle, LCSW 07/08/2011, 1:52 PM

## 2011-07-08 NOTE — Progress Notes (Signed)
Patient has been more pleasant and cooperative today than in the past.  Continues with grandiose thinking and flight of ideas.  States he still has a lot of cravings.  Has requested Seroquel twice today and has had some positive effect from the medication, but continues to request something stronger.  Has attended and participated in groups today and was able to attend meals and go to the gym with a large group.  His self-inventory comment was that he wanted to taste blood and that he would "kill someone who deserved it" if he had the chance.  Did not elaborate on this comment.  Has been using his journal some today and has been doing some reading, although he states it is difficult for him to focus well enough to really comprehend what he is reading.

## 2011-07-08 NOTE — Progress Notes (Signed)
[facility] Group Notes:  (Counselor/Nursing/MHT/Case Management/Adjunct)  07/08/2011 3:19 PM  Type of Therapy:  1:15PM Music Therapy  Participation Level:  Minimal  Participation Quality:  Attentive  Affect:  Anxious  Cognitive:  Oriented  Insight:  Limited  Engagement in Group:  Limited  Engagement in Therapy:  Limited  Modes of Intervention:  Exploration and Music  Summary of Progress/Problems: Brian [name] didn't seem very interested in participating in the relaxation technique activity. He was reading a book during each activity. However, he did mention the first guided imagery activity led him to thinking about the [employer] . He talked about the times he and his friends would go to [employer]. 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    Brian [name] 07/08/2011, 3:19 PM Cosigned by Veto Kemps, MT-BC

## 2011-07-09 NOTE — Progress Notes (Signed)
  Presents anxious and mildly restless and irritable.  Wanting something to 'calm me down' .  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  Requesting benzodiazepines.    He denies physical need to move, or other symptoms of EPS.  Pulse is 78 and regular, looks mildly flushed.  Speech is mildly pressured. Affect and mood irritable.  Manner guarded. Insight poor.  Impulse control and judgement impaired.  Does not tolerate much in the way of interview, resistant.   No suicidal thoughts.  No homicidal thoughts.    Plan:  Will increase Seroquel to 100mg  tid, and continue 300mg  q hs.  Will consider tegretol for mood stability, or possibly clonidine.

## 2011-07-09 NOTE — Progress Notes (Signed)
  Brian [name] is a 25 y.o. male [id number] [birth year]  Subjective/Objective: Patient is declined by [facility] and I discussed with the patient that we are going to refer him to the state hospital;  But I also explained to him that if his symptoms and signs improve, he can also be discharged from here.  He reported a decrease in the voices, less anxiety, denies SI/HI, but still fully unable to understand the concept of rehab.    History   Social History  . Marital Status: Divorced    Spouse Name: N/A    Number of Children: N/A  . Years of Education: N/A   Social History Main Topics  . Smoking status: Current Everyday Smoker    Types: Cigarettes  . Smokeless tobacco: None  . Alcohol Use: No  . Drug Use: Yes    Special: Methamphetamines, Heroin, Marijuana  . Sexually Active:    Other Topics Concern  . None   Social History Narrative  . None     Lab Results:   BMET    Component Value Date/Time   NA 137 06/28/2011 1602   K 4.2 06/28/2011 1602   CL 104 06/28/2011 1602   CO2 25 06/28/2011 1602   GLUCOSE 79 06/28/2011 1602   BUN 11 06/28/2011 1602   CREATININE 0.81 06/28/2011 1602   CALCIUM 10.0 06/28/2011 1602   GFRNONAA >90 06/28/2011 1602   GFRAA >90 06/28/2011 1602    Medications:  Scheduled:     . benztropine  1 mg Oral BID  . diphenhydrAMINE  100 mg Oral QHS  . nicotine  21 mg Transdermal Daily  . QUEtiapine  300 mg Oral QHS  . ziprasidone  20 mg Intramuscular Once     PRN Meds acetaminophen, alum & mag hydroxide-simeth, diphenhydrAMINE, magnesium hydroxide, QUEtiapine, traZODone, ziprasidone   Plan: No side effects reported from the medications.  Will continue current treatment plan.     Mykira Hofmeister S 07/09/2011    Brian [name] is a 25 y.o. male [id number] January 31, 1986

## 2011-07-09 NOTE — Progress Notes (Signed)
Pt up at 2400 asking for something for agitation. States the prn meds he received at bedtime were not effective and he has been "tossing and turning." Seroquel 50mg  prn po given per orders. On R/A pt is sleeping. Level III obs remain in place and pt is safe. Edsel Petrin RN

## 2011-07-09 NOTE — Progress Notes (Signed)
Adult Services Patient-Family Contact/Session  Attendees:  Patient's mother Zhamir Pirro ([phone])  Goal(s):  Return call, update  Safety Concerns: none   Narrative:  Mother stated that patient had called and told her that we were sending him to [place]. Explained to her that he had been denied at [facility] but the referral had been made to [facility] She was asking that in the future could we discuss with her so she could explain it to him . She stated that even though he is street smart and a 25 year old man that he sometimes has the mentality of someone much younger. Updated her that patient is doing better but was still seeking PRN's and reporting that he was not sleeping. She was not surprised with his addiction. Explained to her that we would not put on methadone and that he had finished his detox. Asking questions about getting disability and medicaid for patient because of his inability to work, his anger, etc. Told her how to start process.  Barrier(s):  Patient's current functioning  Interventions:  Support for mother, information  Recommendation(s):  Continued inpatient  Follow-up Required:  No  Explanation:    Veto Kemps 07/09/2011, 3:01 PM
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
## 2011-07-09 NOTE — Tx Team (Signed)
Interdisciplinary Treatment Plan Update (Adult)  Date:  07/09/2011  Time Reviewed:  1:37 PM   Progress in Treatment: Attending groups: Yes. Participating in groups:  Yes. Taking medication as prescribed:  Yes. Tolerating medication:  Yes. Family/Significant othe contact made:  Yes, individual(s) contacted:  Mother Patient understands diagnosis:  No. Discussing patient identified problems/goals with staff:  Yes. Medical problems stabilized or resolved:  Yes. Denies suicidal/homicidal ideation: Yes.  However, this is intermittent Issues/concerns per patient self-inventory:  No. Other:  New problem(s) identified: Yes, Describe:  Patient has been declined by [facility]  Reason for Continuation of Hospitalization: Aggression Anxiety Delusions  Depression Homicidal ideation Medication stabilization  Interventions implemented related to continuation of hospitalization:  Medication monitoring and adjustment, safety checks Q15 min., group therapy, psychoeducation, collateral contact  Additional comments:  Not applicable  Estimated length of stay:  5-7 days  Discharge Plan:  Stabilize psychosis, then send to rehab  New goal(s):  Not applicable  Review of initial/current patient goals per problem list:   1.  Goal(s):  Reduce depression from a 10 to 3  Met:  No  Target date:  By Discharge   As evidenced by:  Depression rated at 10 today, hopelessness at 10  2.  Goal (s):  Eliminate SI statements  Met:  No  Target date:  By Discharge   As evidenced by:  SI off and on "I like to taste my blood"  3.  Goal(s):  Reduce psychotic symptoms to baseline  Met:  No  Target date:  By Discharge   As evidenced by:  Remains delusional and outlandish, grandiose   4.  Goal(s): Address substance abuse issues  Met:  No  Target date:  By Discharge   As evidenced by:  Patient was denied by [facility]  Attendees: Patient:  Did not attend 07/09/2011  1:37 PM   Family:     Physician:   Dr. Gwenyth Bouillon Ahluwalia 07/09/2011  1:37 PM   Nursing:   Shann Medal, RN 07/09/2011  1:37 PM   Case Manager:  Ambrose Mantle, LCSW 07/09/2011  1:37 PM   Counselor:  Veto Kemps, MT-BC 07/09/2011  1:37 PM   Other:   Other:     Other:     Other:      Scribe for Treatment Team:   Sarina Ser, 07/09/2011, 1:37 PM   2

## 2011-07-09 NOTE — Discharge Planning (Signed)
Met with patient in Aftercare Planning Group, informed him of referral to [facility], how that process works, our desire to see him stabilize and go to rehab prior to bed coming available at [facility].  Talked at length about why cursing may be objectionable, worked with patient to focus attention on his words.  Gave ideas about how he can reinforce his successes, i.e. Charting in journal himself or asking co-patients to write words of encouragement when he goes without cursing.  Strokes given for working on this, successes seen in group.  No CM needs today.  Ambrose Mantle, LCSW 07/09/2011, 1:48 PM

## 2011-07-09 NOTE — Progress Notes (Signed)
Pt states he slept poor, energy level is low. Pt rates his depression as 10 and hopelessness as 10. Pt describes his moods as "ups and downs". Pt c/o tremors, cravings and agitation. Pt states he has feelings of SI "off and on". Pt goes on to write on his self inventory, "I like to taste my blood, I need help with [employer] or [employer], goals are listed as, " Make a bathtub walk-in and join [employer]." Pt's behavior is erratic, mood: loud and attention seeking.

## 2011-07-10 DIAGNOSIS — F29 Unspecified psychosis not due to a substance or known physiological condition: Principal | ICD-10-CM

## 2011-07-10 MED ORDER — NICOTINE 21 MG/24HR TD PT24
21.0000 mg | MEDICATED_PATCH | Freq: Every day | TRANSDERMAL | Status: DC
  Administered 2011-07-11 – 2011-07-19 (×9): 21 mg via TRANSDERMAL
  Filled 2011-07-10 (×9): qty 1

## 2011-07-10 NOTE — Progress Notes (Signed)
   Brian [name] is a 25 y.o. male [id number] 1985/11/25  Subjective/Objective:  Patient reports not able to sleep at night. Reports being restless sometimes. Stays isolated on the unit.   Mental Status: AO x 3 Eye Contact: Good Speech: Normal Motor Behavior: Normal Level of Consciousness: Alert Mood: Depressed - Severe.  Affect: Moderately Constricted and Tearful.  Anxiety Level: Severe.  Thought Process: Coherent  Thought Content: No auditory or visual hallucinations or delusional thinking reported.  Perception: Normal  Judgment:  Fair   Insight: limited  Orientation time, place and person      Lab Results:   BMET    Component Value Date/Time   NA 137 06/28/2011 1602   K 4.2 06/28/2011 1602   CL 104 06/28/2011 1602   CO2 25 06/28/2011 1602   GLUCOSE 79 06/28/2011 1602   BUN 11 06/28/2011 1602   CREATININE 0.81 06/28/2011 1602   CALCIUM 10.0 06/28/2011 1602   GFRNONAA >90 06/28/2011 1602   GFRAA >90 06/28/2011 1602    Medications:  Scheduled:     . benztropine  1 mg Oral BID  . diphenhydrAMINE  100 mg Oral QHS  . nicotine  21 mg Transdermal Q0600  . QUEtiapine  300 mg Oral QHS  . ziprasidone  20 mg Intramuscular Once  . DISCONTD: nicotine  21 mg Transdermal Daily       Plan:  No side effects reported from the medications.  Will continue current treatment plan. Encouraged sleep hyegeine

## 2011-07-10 NOTE — Progress Notes (Signed)
  Pt.states he is having cravings and Suicidal thoughts off and on.States he has no money or no place to go and can not stop dreaming about or wanting Drugs.He rates his Depression as a 10/10 and his hopelessness as 9/10.Encouraged and supported.

## 2011-07-10 NOTE — Progress Notes (Signed)
   [facility] Group Notes:  (Counselor/Nursing/MHT/Case Management/Adjunct)  07/10/2011 11 AM  Type of Therapy:  Group Therapy, Dance/Movement Therapy   Participation Level:  Minimal  Participation Quality:  Attentive  Affect:  Anxious  Cognitive:  Oriented  Insight:  Limited  Engagement in Group:  Limited  Engagement in Therapy:  Limited  Modes of Intervention:  Clarification, Problem-solving, Role-play, Socialization and Support  Summary of Progress/Problems:  Pt. Shared he was not feeling well.  Thomasena Edis, [employer]

## 2011-07-10 NOTE — Progress Notes (Signed)
  In bed with eyes closed most of shift. Exhibiting normal sleep behavior when assessed and observed,

## 2011-07-11 NOTE — Progress Notes (Signed)
  Patient. was informed of his scheduled hs medications and pt reported that he did not know why the Dr has him on Seroquel  and was irritable about the order for medication because patient reported that it does not work for him. Patient was encouraged to talk with his Dr. Concerning this matter. Pt took the scheduled medications and reported having no pain, -SI/HI/A/V hall. Safety maintained on unit, will continue to monitor.
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
## 2011-07-11 NOTE — Progress Notes (Signed)
Pt. attended and participated in aftercare planning group. [facility] support group information was given as well as information on suicide prevention information, warning signs to look for with suicide and crisis line numbers to use. Pt. Complained of having severe withdrawal symptoms during group.

## 2011-07-11 NOTE — Progress Notes (Signed)
Pt has been up and visible in milieu today, participated in milieu activities to the best of his ability at this time, has c/o severe anxiety and agitation today, having feelings of wanting to "go off", was pacing today, spoke about not being able to calm down and having racing thoughts and thoughts of wanting to use drugs, pt did receive IM geodon as ordered for agitation and pt did report satisfactory results from injection, spoke about needing to go to rehab and feels if he does not that he will end up back on the streets and wind up using drugs again.

## 2011-07-11 NOTE — Progress Notes (Signed)
  Brian [name] is a 25 y.o. male [id number] 11-05-1985  Subjective/Objective:  sleeping on bed. Not interested to talk today on repeated attempts.   Mental Status: AO x 3 Unable to assess, sleeping comfortably      Lab Results:   BMET    Component Value Date/Time   NA 137 06/28/2011 1602   K 4.2 06/28/2011 1602   CL 104 06/28/2011 1602   CO2 25 06/28/2011 1602   GLUCOSE 79 06/28/2011 1602   BUN 11 06/28/2011 1602   CREATININE 0.81 06/28/2011 1602   CALCIUM 10.0 06/28/2011 1602   GFRNONAA >90 06/28/2011 1602   GFRAA >90 06/28/2011 1602    Medications:  Scheduled:     . benztropine  1 mg Oral BID  . diphenhydrAMINE  100 mg Oral QHS  . nicotine  21 mg Transdermal Q0600  . QUEtiapine  300 mg Oral QHS  . ziprasidone  20 mg Intramuscular Once       Plan:  Will continue current treatment plan.

## 2011-07-12 MED ORDER — DIVALPROEX SODIUM ER 250 MG PO TB24
250.0000 mg | ORAL_TABLET | Freq: Two times a day (BID) | ORAL | Status: DC
  Administered 2011-07-12 – 2011-07-15 (×6): 250 mg via ORAL
  Filled 2011-07-12 (×8): qty 1

## 2011-07-12 MED ORDER — DIVALPROEX SODIUM ER 250 MG PO TB24
250.0000 mg | ORAL_TABLET | Freq: Every day | ORAL | Status: DC
  Administered 2011-07-12 – 2011-07-14 (×3): 250 mg via ORAL
  Filled 2011-07-12 (×4): qty 1

## 2011-07-12 NOTE — Progress Notes (Signed)
Recreation Therapy Group Note  Date: 07/12/2011         Time: 0930      Group Topic/Focus: The focus of this group is on promoting emotional and psychological well-being through the process of creative expression, relaxation, socialization, fun and enjoyment.   Participation Level: None  Participation Quality: Resistant  Affect: Blunted  Cognitive: Alert   Additional Comments: Patient quiet, easily distracted by male peer, left group when she was asked to do so and didn't return.   Brian [name] 07/12/2011 12:52 PM

## 2011-07-12 NOTE — Discharge Planning (Signed)
Met with patient in Aftercare Planning Group, where he talked at length about continuing to be agitated and having a difficult time.  Case Manager asked him about progress on goal of not cursing, and he stated he was having a hard time doing that; however, the other patients stated he had done quite well all weekend.  Case Manager provided positive strokes for his efforts.  He continues to complain that he cannot focus enough to read, keeps hopping between different books because his concentration gets broken easily.  He also states he is very anxious, and his bones are hurting.  He stated additionally that he has decided he will not go live with mother at discharge, but had no ideas about where else he would go.  Case Manager reminded him that once his agitation and other symptoms are under control, we hope to send him to [facility] or [facility].  He did not like the idea of [facility], and asked to go to [facility].  Case Manager reminded him that he will not be accepted if he remains agitated.  Case Manager confirmed with [facility] that we still need a bed for patient.  Also did sponsorship review for additional days.  Ambrose Mantle, LCSW 07/12/2011, 2:51 PM

## 2011-07-12 NOTE — Progress Notes (Signed)
Patient's sister, Caryl Asp called and expressed concerns from she and her mother that patient was more agitated over the weekend, and didn't think the Seroquel was working. He has been tried on this before but was not successful.  Counselor informed doctor of concerns and called sister back to report that doctor would reevaluate the medications.

## 2011-07-12 NOTE — Progress Notes (Signed)
Patient ID: Brian [name], male   DOB: 1986/06/09, 25 y.o.   MRN: [id number]  See RN's progress report.  He has been sleeping and does not want to talk.  He has been continuing to complain of anxiety and severe drug cravings.  Has been disruptive in the day room.  His mood has been labile and his complaints of anxiety variable.   Discussed with Dr. Rogers Blocker who requests I start him on Depakote.  Plan:  Depakote 250mg  ER bid and hs with meals.

## 2011-07-12 NOTE — Progress Notes (Signed)
Pt has been up today, spoke about feeling agitated, restless and fidgety today and spoke about drug use and this topic kept coming up in conversation for much of the day, pt did receive IM geodon for agitation today,support provided, will continue to monitor.

## 2011-07-13 NOTE — Progress Notes (Signed)
[facility] Group Notes:  (Counselor/Nursing/MHT/Case Management/Adjunct)  07/13/2011 12:30 PM  Type of Therapy:  Group Therapy  Participation Level:  Did Not Attend   Brian [name] 07/13/2011, 12:30 PM

## 2011-07-13 NOTE — Discharge Planning (Signed)
Patient attended Aftercare Planning Group, but left prior to end of group after reporting being agitated.  He continues to complain at every opportunity. 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  It has been 5 years since he last did that.  He was unable to see that continued drug use will prevent him from achieving this or other goals.  No Case Management needs today.  Place on [name] wait list was reconfirmed yesterday.  Ambrose Mantle, LCSW 07/13/2011, 12:08 PM

## 2011-07-13 NOTE — Progress Notes (Signed)
Pt has been in bed, sleeping all morning, with no physical complaints except, "I almost puke up".  Pt rates depression at 10, hopelessness at 10. Pt writes in his self inventory that he feels SI "off and on" but, "More against stupid people." Pt states that he plans to "stay on his own, not with his mother" after he leaves. Pt would also like staff to call "Ella Bodo (sp?) at [employer] to get his job back." Pt has poor insight, does not seem motivated for treatment. Pt received Geodon 20 at 0800 prn "anxiety" and Seroquel 50 at 1330. MD to d/c Geodon according to treatment team.

## 2011-07-13 NOTE — Tx Team (Signed)
Interdisciplinary Treatment Plan Update (Adult)  Date:  07/13/2011  Time Reviewed:  10:12 AM   Progress in Treatment: Attending groups: Yes. Participating in groups:  Yes. Taking medication as prescribed:  Yes. and As evidenced by:  requests Geodon IM frequently Tolerating medication:  Yes. Family/Significant othe contact made:  Yes, individual(s) contacted:  mother and sister Patient understands diagnosis:  No. Discussing patient identified problems/goals with staff:  Yes. Medical problems stabilized or resolved:  Yes. Denies suicidal/homicidal ideation: Yes. Issues/concerns per patient self-inventory:  No. Other:  New problem(s) identified: No, Describe:  None  Reason for Continuation of Hospitalization: Aggression Anxiety Delusions  Medication stabilization Other; describe placement into [facility]  Interventions implemented related to continuation of hospitalization:  Medication monitoring and adjustment, safety checks Q15 min., group therapy, psychoeducation, collateral contact  Additional comments:  Not applicable  Estimated length of stay:  5-7 days  Discharge Plan:  Is on [facility] wait list  New goal(s):  Not applicable  Review of initial/current patient goals per problem list:   1. Goal(s): Reduce depression from a 10 to 3  Met: No  Target date: By Discharge  As evidenced by: Remains constantly symptomatic 2. Goal (s): Eliminate SI statements  Met: No  Target date: By Discharge  As evidenced by: SI off and on, question has arisen as to whether patient is simply malingering 3. Goal(s): Reduce psychotic symptoms to baseline  Met: No  Target date: By Discharge  As evidenced by: Remains grandiose and complaining, again question at hand as to whether patient is malingering 4. Goal(s): Address substance abuse issues  Met: No  Target date: By Discharge  As evidenced by: Patient was denied by [facility], patient is telling conflicting stories about length of sobriety, history  with rehab, and mother states has not used for awhile - patient continues to complain of symptoms of withdrawal, but this does not seem based in reality -- again is patient malingering  Attendees: Patient:  Did not attend 07/13/2011  10:12 AM   Family:      Physician:  Dr. Gwenyth Bouillon Ahluwalia 07/13/2011  10:12 AM   Nursing:   Carolynn Comment, RN 07/13/2011  10:12 AM   Case Manager:  Ambrose Mantle, LCSW 07/13/2011  10:12 AM   Counselor:  Veto Kemps, MT-BC 07/13/2011  10:12 AM   Other:  Robbie Louis, RN 07/13/2011  10:12 AM   Other:     Other:     Other:      Scribe for Treatment Team:   Sarina Ser, 07/13/2011, 10:12 AM

## 2011-07-14 MED ORDER — QUETIAPINE FUMARATE 400 MG PO TABS
400.0000 mg | ORAL_TABLET | Freq: Every day | ORAL | Status: DC
  Administered 2011-07-14: 400 mg via ORAL
  Filled 2011-07-14 (×2): qty 1

## 2011-07-14 NOTE — Progress Notes (Signed)
Pt laying in bed resting with eyes closed. No distress noted. 

## 2011-07-14 NOTE — Progress Notes (Signed)
Pt has not attended groups today, has stayed in bed. Pt became agitated this morning when he was given Seroquel first at 0746 for anxiety and not Geodon. Pt was told Geodon was for extreme anxiety, and at that time pt began to raise his voice, demanding Geodon. "Seroquel doesn't do anything for me!!!". Pt then given Geodon at 0753, MD made aware of the fact pt has been asking for Geodon every morning. MD D/Ced Geodon orders..See the written copy of this report in the patient's paper medical record.  These results did not interface directly into the electronic medical record and are summarized here. New MD orders. Pt denies SI/HI. Irritable today.

## 2011-07-14 NOTE — Progress Notes (Signed)
Spoke with patient's sister Brian [name] who works at [employer], has significant knowledge of mental health issues and particularly the history of her brother.  He started this behavior age 25, does not have a diagnosis of borderline intellectual functioning or personality disorder; however, he has for years not understood the consequences of his behavior.  Patient's sister reiterated that Seroquel has been tried in the past and was not effective.  What has worked in tandem in the past has been Depakote and Effexor.  Case Manager agreed to fax and email information about guardianship, and the process in [place].  Brian Mantle, LCSW 07/14/2011, 4:04 PM

## 2011-07-14 NOTE — Progress Notes (Signed)
  Brian [name] is a 25 y.o. male [id number] 10/28/1985  Subjective/Objective:  Patient continues to feel depressed and have suicidal ideations on and off. He reported decrease in the intensity of the voices but he does not seem to be responding to the inner stimuli. He's not disorganized he can make a good conversation. I discussed with him about his medications I told him that I would increase Seroquel to 400 mg at bedtime and discontinuing Geodon IM as he will not be getting IM injections daily in the outpatient setting. Patient agrees with it.  Filed Vitals:   07/14/11 0812  BP: 115/76  Pulse: 120  Temp:   Resp:     Lab Results:   BMET    Component Value Date/Time   NA 137 06/28/2011 1602   K 4.2 06/28/2011 1602   CL 104 06/28/2011 1602   CO2 25 06/28/2011 1602   GLUCOSE 79 06/28/2011 1602   BUN 11 06/28/2011 1602   CREATININE 0.81 06/28/2011 1602   CALCIUM 10.0 06/28/2011 1602   GFRNONAA >90 06/28/2011 1602   GFRAA >90 06/28/2011 1602    Medications:  Scheduled:     . benztropine  1 mg Oral BID  . diphenhydrAMINE  100 mg Oral QHS  . divalproex  250 mg Oral BID WC  . divalproex  250 mg Oral QHS  . nicotine  21 mg Transdermal Q0600  . QUEtiapine  400 mg Oral QHS  . DISCONTD: QUEtiapine  300 mg Oral QHS  . DISCONTD: ziprasidone  20 mg Intramuscular Once     PRN Meds acetaminophen, alum & mag hydroxide-simeth, diphenhydrAMINE, magnesium hydroxide, QUEtiapine, traZODone, DISCONTD: ziprasidone   AHLUWALIA,SHAMSHER S MD 07/14/2011

## 2011-07-15 MED ORDER — DIVALPROEX SODIUM ER 500 MG PO TB24
1000.0000 mg | ORAL_TABLET | Freq: Every day | ORAL | Status: DC
  Administered 2011-07-15 – 2011-07-17 (×3): 1000 mg via ORAL
  Filled 2011-07-15 (×5): qty 2

## 2011-07-15 MED ORDER — ZIPRASIDONE HCL 80 MG PO CAPS
80.0000 mg | ORAL_CAPSULE | Freq: Two times a day (BID) | ORAL | Status: DC
  Administered 2011-07-15 – 2011-07-17 (×6): 80 mg via ORAL
  Filled 2011-07-15 (×9): qty 1

## 2011-07-15 NOTE — Discharge Planning (Signed)
Patient refused to stay in Aftercare Planning Group, was agitated.  Met with his sister briefly when she visited him for lunch.  Gave her additional description of guardianship process.  She and family have decided to pursue that avenue, and they do not want mother or state to be guardian, so it is likely to be either her or a local aunt to take guardian role.  Ambrose Mantle, LCSW 07/15/2011, 3:53 PM

## 2011-07-15 NOTE — Progress Notes (Signed)
  Brian [name] is a 25 y.o. male [id number] Jan 09, 1986  Subjective/Objective: As per social worker note. patient's sister [name] who works at [employer], has significant knowledge of mental health issues and particularly the history of her brother. He started this behavior age 32, does not have a diagnosis of borderline intellectual functioning or personality disorder; however, he has for years not understood the consequences of his behavior.  Patient's sister reiterated that Seroquel has been tried in the past and was not effective.  What has worked in tandem in the past has been Depakote and Effexor.  Patient still reporting hearing voices but denies having suicidal or homicidal ideations. He still seems anxious and restless. As Seroquel has not helped in the past I will switch it to  a Geodon  we'll see how the patient responde to it.   Filed Vitals:   07/15/11 0806  BP: 133/64  Pulse:   Temp: 97.4 F (36.3 C)  Resp: 18    Lab Results:   BMET    Component Value Date/Time   NA 137 06/28/2011 1602   K 4.2 06/28/2011 1602   CL 104 06/28/2011 1602   CO2 25 06/28/2011 1602   GLUCOSE 79 06/28/2011 1602   BUN 11 06/28/2011 1602   CREATININE 0.81 06/28/2011 1602   CALCIUM 10.0 06/28/2011 1602   GFRNONAA >90 06/28/2011 1602   GFRAA >90 06/28/2011 1602    Medications:  Scheduled:     . benztropine  1 mg Oral BID  . diphenhydrAMINE  100 mg Oral QHS  . divalproex  250 mg Oral BID WC  . divalproex  250 mg Oral QHS  . nicotine  21 mg Transdermal Q0600  . QUEtiapine  400 mg Oral QHS  . DISCONTD: QUEtiapine  300 mg Oral QHS  . DISCONTD: ziprasidone  20 mg Intramuscular Once     PRN Meds acetaminophen, alum & mag hydroxide-simeth, diphenhydrAMINE, magnesium hydroxide, QUEtiapine, traZODone, DISCONTD: ziprasidone   AHLUWALIA,SHAMSHER S MD 07/15/2011

## 2011-07-15 NOTE — Progress Notes (Signed)
Patient asleep in bed. Resting with eyes closed. Appears in no distress. Respirations even, normal and unlabored. Will continue monitoring patient q62minutes.

## 2011-07-15 NOTE — Progress Notes (Signed)
Patient ID: Brian [name], male   DOB: October 11, 1985, 25 y.o.   MRN: [id number] Pt. Is restless and irritable this shift. "I just want to get the f** out of here." Denies SI.Rates depression and hopelessness at 10 with low energy,poor appetite, and poor sleep. Family informed of current medicattions.  Medication education provided on Geodon and pt verbalized understanding.  No insight into SA issues and has poor filters with conversation. Redirection when necessary. Limits and boundaries set for inappropriate behaviors. Cont 15' checks for safety.

## 2011-07-16 LAB — COMPREHENSIVE METABOLIC PANEL
AST: 18 U/L (ref 0–37)
Albumin: 3.8 g/dL (ref 3.5–5.2)
BUN: 12 mg/dL (ref 6–23)
Calcium: 9.6 mg/dL (ref 8.4–10.5)
Chloride: 100 mEq/L (ref 96–112)
Creatinine, Ser: 1.05 mg/dL (ref 0.50–1.35)
Total Bilirubin: 0.1 mg/dL — ABNORMAL LOW (ref 0.3–1.2)

## 2011-07-16 LAB — VALPROIC ACID LEVEL: Valproic Acid Lvl: 37.5 ug/mL — ABNORMAL LOW (ref 50.0–100.0)

## 2011-07-16 MED ORDER — CLONIDINE HCL 0.1 MG PO TABS
0.1000 mg | ORAL_TABLET | Freq: Two times a day (BID) | ORAL | Status: DC | PRN
  Administered 2011-07-17 (×2): 0.1 mg via ORAL

## 2011-07-16 NOTE — Progress Notes (Signed)
[facility] Group Notes:  (Counselor/Nursing/MHT/Case Management/Adjunct)  07/16/2011 12:11 PM  Type of Therapy:  Group Therapy  Participation Level:  Did Not Attend   Veto Kemps 07/16/2011, 12:11 PM

## 2011-07-16 NOTE — Progress Notes (Signed)
[facility] MD Progress Note  07/16/2011 3:16 PM  O/MSE:   Brian [name] is in the bed sleeping.  Says he did not go to lunch because he doesn't sleep a lot at night.  The staff have felt he was a little less pressured and anxious today, more approachable.  However, he is quite irritable with me, resents me disturbing him.  Answers me with clipped yes or no responses and is clearly not interested in any conversation.  Has little to offer.    The nurses notes indicate he has been given Seroquel for anxiety within the last 24 hours.  And he has also indicated Seroquel did not work for him.  He remains tachycardic, possibly due to the daily doses of anticholinergic for sleep.  Yet, he complains he cannot sleep well.    Says he is not suicidal, does not want to hurt anyone.    Sleep:  Number of Hours: 6.75   Vital Signs:Blood pressure 129/75, pulse 111, temperature 97.7 F (36.5 C), temperature source Oral, resp. rate 20, height 5\' 11"  (1.803 m), weight 87.544 kg (193 lb).  Lab Results: No results found for this or any previous visit (from the past 48 hour(s)).  Physical Findings: AIMS:  , ,  ,  ,    CIWA:  CIWA-Ar Total: 0  COWS:  COWS Total Score: 0   Plan:  I have discussed options today in phone conference with Dr. Rogers Blocker who agrees with the following plan:  Will check EKG, cmet, and vpa level.   D/C Benadryl at night.   Will give Clonidine .1 mg q hs, and bid prn anxiety.      Dorthula Bier A 07/16/2011, 3:16 PM

## 2011-07-16 NOTE — Progress Notes (Signed)
Patient ID: Brian [name], male   DOB: 04/28/1986, 25 y.o.   MRN: [id number] Pt is very irritable and is stating that he will not go to meals. Has also refused EKG at this time. Will report to oncoming RN to attempt later this shift.

## 2011-07-16 NOTE — Progress Notes (Signed)
Pt requested and received seroquel 50mg  for increased anxiety. Will reassess for effectiveness.

## 2011-07-16 NOTE — Progress Notes (Signed)
Patient ID: Brian [name], male   DOB: Jan 27, 1986, 25 y.o.   MRN: [id number] Pt quiet ans seclusive this shift. Staes he is depressed and affect is very constricted. Came back from lunch before going through cafeteria line and  Went to bed stating he " doesn't feel good." Irritable mood.  PRN medications offered and refused. "Off and on" SI and contracts for safety.  Rates depression and hopelessness at10. Cont support and limit setting. 15' checks cont for safety.

## 2011-07-16 NOTE — Discharge Planning (Signed)
Met with patient in Aftercare Planning Group, where he was quieter and significantly less intrusive.  He did eventually get up and leave, and did not remain to end of group.  Feels that staff should have listened to him sooner about Serquel not working, but is glad that somehow we finally did listen to him.  So is excited to be on Geodon.  Asks for discharge Monday.  During Aftercare Planning Group, Case Manager provided psychoeducation on "Suicide Prevention Information."  This included descriptions of risk factors for suicide, warning signs that an individual is in crisis and thinking of suicide, and what to do if this occurs.  Pt indicated understanding of information provided, and will read brochure given upon discharge.    Ambrose Mantle, LCSW 07/16/2011, 1:42 PM

## 2011-07-16 NOTE — Progress Notes (Signed)
Patient ID: Brian [name], male   DOB: 1986-02-19, 25 y.o.   MRN: [id number] 1530- Pt reports relief from anxiety.

## 2011-07-16 NOTE — Progress Notes (Signed)
Patient asleep in bed, resting with eyes closed. Appears in no distress. Respirations even, normal and unlabored. Will continue monitoring patient q15 minutes. 

## 2011-07-17 ENCOUNTER — Other Ambulatory Visit: Payer: Self-pay

## 2011-07-17 MED ORDER — DIPHENHYDRAMINE HCL 50 MG/ML IJ SOLN: 50.0000 mg | Freq: Once | INTRAMUSCULAR | Status: DC

## 2011-07-17 MED ORDER — CLONIDINE HCL 0.1 MG PO TABS
0.1000 mg | ORAL_TABLET | ORAL | Status: DC
  Administered 2011-07-17 – 2011-07-18 (×2): 0.1 mg via ORAL
  Filled 2011-07-17 (×6): qty 1

## 2011-07-17 MED ORDER — DIPHENHYDRAMINE HCL 50 MG/ML IJ SOLN
INTRAMUSCULAR | Status: AC
  Administered 2011-07-17: 18:00:00
  Filled 2011-07-17: qty 1

## 2011-07-17 MED ORDER — BENZTROPINE MESYLATE 1 MG PO TABS
1.0000 mg | ORAL_TABLET | Freq: Two times a day (BID) | ORAL | Status: DC | PRN
  Administered 2011-07-18: 1 mg via ORAL
  Filled 2011-07-17: qty 1

## 2011-07-17 NOTE — Progress Notes (Signed)
[facility] MD Progress Note  07/17/2011 5:51 PM  Pt seen and evaluated s/p acute episode of worsening EPS. Patient seen and evaluated this morning by Lynann Bologna, NP.  At this time, he had worsening oral buccal EPS in light of recent initiation of antipsychotic treatment. Per the nursing staff, this has significantly worsened and was not seen previously. He was given IM Benadryl 50 mg x1 around 5 PM.  Assessment and plan: At this time we will discontinue and hold the antipsychotic. He did receive Benadryl 50 mg IM x1 at 5 PM. I also ordered Cogentin 1 mg by mouth twice a day in the interim and I will discuss his medications with Lynann Bologna, NP, in the am who has been managing his primary medications.  Further treatment alterations pending.  Discussed with team.  Pt agreeable with plan.  Lupe Carney 07/17/2011, 5:51 PM

## 2011-07-17 NOTE — Progress Notes (Signed)
Patient ID: Brian [name], male   DOB: 1985-12-13, 25 y.o.   MRN: [id number] Pt quiet and isolative this am but did attend group with no participation. EKG completed and results to provider. States on patient inventory that he is positive Holiday representative for safety with staff. Rates depression and hopelessness at 10.  Reports poor appetite and "forced himself to eat."  C/O agitation and requested prn medication. Relaxation suggestions offered.  Ongoing support and postive feedback for appropriate behavior. 15' checks cont for safety.

## 2011-07-17 NOTE — Progress Notes (Signed)
Patient ID: Brian [name], male   DOB: 1986-08-12, 25 y.o.   MRN: [id number]    Subjective:  [name] was seen by me around 9am this morning.  He was up in the dayroom for group therapy and participating appropriately.  He reports not sleeping at night, didn't sleep last night - and this is one of his primary complaints.  I explained the med changes I made yesterday - eliminating the Seroquel, and will eliminate the Benadryl as it is possibly causing his tachycardia.    I reviewed his EKG with him - Normal Sinus Rhythm at 91/minute, normal EKG, sent to cardiology for formal reading.   Clonidine will be his new medication to hopefully improve his sleep and reduce his sympathetic activation.  He also says Trazodone has worked well for him before, so will also make this available.    MSE: Pleasant and cooperative with blunt affect.  Wanting to know when he will go home and he seems not aware that he is going to [facility] so I did not go into that with him.  He does appear chronically anxious.  EKG is normal.  He is taking his meds.  Insight, judgement, and impulse control impaired.  No delusional statements.  Thinking quite concrete.  Denies dangerous ideas.    Plan: Clonidine .1 mg bid & HS and bid prn agitation. Hold if systolic BP<100 D/c Seroquel and Benadryl. Monitor closely

## 2011-07-17 NOTE — Plan of Care (Signed)
Problem: Alteration in thought process Goal: STG-Patient is compliant with medication regimen Outcome: Progressing Pt. Took his medication readily with only a single prompt.  Comments:  Will continue to monitor and report.

## 2011-07-17 NOTE — Progress Notes (Signed)
Patient ID: Brian [name], male   DOB: 1986/04/04, 25 y.o.   MRN: [id number] 1730- Pt states"not feeling good" and asking for something to "calm down".  Pacing and restless. Involuntary mouth and lip pursing and states he "can't stop".  Reported to provider. BP 120/80 pulse 80 temp 97.7. Color wnl. Benadryl 50mg  IM per MD order R gluteal.

## 2011-07-17 NOTE — Progress Notes (Signed)
Patient ID: Brian [name], male   DOB: [birth year], 25 y.o.   MRN: [id number]   [facility] Group Notes:  (Counselor/Nursing/MHT/Case Management/Adjunct)  07/17/2011 11 AM  Type of Therapy:  Group Therapy, Dance/Movement Therapy   Participation Level:  Did Not Attend   Kym Groom

## 2011-07-17 NOTE — Progress Notes (Signed)
Patient has remained in bed this evening and refused to answer more than simple questions with simple answers.  Pt. stated. "I'm depressed and don't want to live--I just want to lie down."  Pt. took meds  delivered to his room (due to lateness of the hour) readily after a single prompt.  Pt. endorsed "hearing voices" but did not expand on the answer.  Pt.'s voice was sharp and irritable.  Denies any vegetative disturbance.  No other issues identified. Pt. returned to bed after taking his meds.

## 2011-07-18 MED ORDER — METHOCARBAMOL 500 MG PO TABS
500.0000 mg | ORAL_TABLET | Freq: Four times a day (QID) | ORAL | Status: DC | PRN
  Administered 2011-07-18: 500 mg via ORAL
  Filled 2011-07-18: qty 1

## 2011-07-18 MED ORDER — CLONIDINE HCL 0.1 MG PO TABS
0.1000 mg | ORAL_TABLET | ORAL | Status: DC
  Administered 2011-07-18 – 2011-07-19 (×2): 0.1 mg via ORAL
  Filled 2011-07-18 (×2): qty 1

## 2011-07-18 MED ORDER — DIVALPROEX SODIUM ER 500 MG PO TB24
1250.0000 mg | ORAL_TABLET | Freq: Every day | ORAL | Status: DC
  Administered 2011-07-18: 1250 mg via ORAL
  Filled 2011-07-18: qty 1

## 2011-07-18 NOTE — Progress Notes (Signed)
Pt seclusive to room and quiet this shift but did attend groups with participation when prompted.  Oral/buccal EPS decreased following prn cogentin administration. C/O jaw pain, then hip pain that Decklin stated was unreleived by prn pain medication,but there was no observable discomfort behaviors when observered. He has been quietly reading a book this shift but will take the opportunity to request medication when approached.  Positive reenforcement given. "Off and on" SI and contracts for safety. Cont support and 15' checks for safety.

## 2011-07-18 NOTE — Progress Notes (Signed)
Patient ID: Brian [name], male   DOB: 04-16-1986, 25 y.o.   MRN: [id number]   S. Had an episode of EPS yesterday afternoon.  Geodon discontinued.  Clonidine dose at 1500 held yesterday due to patient already sleeping.    Today he says his jaw is always like that, that the Geodon is not the cause. Wonders why he can't stay on the Geodon. We discussed using a muscle relaxer to help him.   O:  He seems less irritable.  Pulse is gradually normalizing and EKG was normal.  He appears less anxious today, though insight, impulse control and judgement are severely impaired.  Cooperative and directable today.   Plan: Will work with the Depakote and increase it to 1250mg  q hs to stabilize mood and alleviate anxiety. Last VPA level 37.5 07/16/11.  Methocarbamol for muscle spasms/ jaw spasms.  Will not restart antipsychotics at this time.   I have decreased Clonidine to .1 mg q am and q hs.

## 2011-07-18 NOTE — Progress Notes (Signed)
Patient ID: Brian [name], male   DOB: September 30, 1985, 25 y.o.   MRN: [id number] Nursing: Pt. seen lying in his bed reading until spoken to when he immediately sat up on the side of the bed to answer questions.  Pt. states the jaw ache is reduced, but immediately asked when he could get more medication: Pt. was reminded of HS med pass time and he seemed mollified.   22:00 --Pt. took his HS meds at the med. window, only asking for trazodone for sleep and went to bed shortly thereafter.

## 2011-07-18 NOTE — Progress Notes (Signed)
Nursing:  Pt. has been sleeping for most of the shift: awake for HS meds only.  Pt. refused to respond to call for group. Pt. Is awake and alert at the med. window and stated he only wants drugs.  Pt. asked for and was given trazodone along with his regularly ordered meds.  Pt. returned to his room for the night. 06:00--Slept through the night.

## 2011-07-18 NOTE — Progress Notes (Signed)
[facility] Group Notes:  (Counselor/Nursing/MHT/Case Management/Adjunct)  07/18/2011 11 AM  Type of Therapy:  Group Therapy, Dance/Movement Therapy   Participation Level:  Minimal  Participation Quality:  Attentive, Sharing and Supportive  Affect:  Appropriate and Depressed  Cognitive:  Appropriate  Insight:  Limited  Engagement in Group:  Limited  Engagement in Therapy:  Limited  Modes of Intervention:  Clarification, Problem-solving, Role-play, Socialization and Support  Summary of Progress/Problems:Group explored concepts of empowerment and inner personal strength and how to achieve these concepts in daily living. Pt discussed how they are negatively effected by hot tempetures and how they have struggles in being empowered and commutative when feeling "hot". Pt spoke about "wanting to be himself" and "get back to being me". He also spoke about his back pain and how this limits him in his personal and emotional growth.  Gevena Mart

## 2011-07-19 MED ORDER — DIVALPROEX SODIUM ER 250 MG PO TB24: 1250.0000 mg | ORAL_TABLET | Freq: Every day | ORAL | Status: DC

## 2011-07-19 MED ORDER — CLONIDINE HCL 0.1 MG PO TABS: 0.1000 mg | ORAL_TABLET | ORAL | Status: DC

## 2011-07-19 MED ORDER — TRAZODONE HCL 100 MG PO TABS: 100.0000 mg | ORAL_TABLET | Freq: Every evening | ORAL | Status: DC | PRN

## 2011-07-19 NOTE — Progress Notes (Signed)
Recreation Therapy Group Note  Date: 07/19/2011         Time: 0930      Group Topic/Focus: The focus of this group is on promoting emotional and psychological well-being through the process of creative expression, relaxation, socialization, fun and enjoyment.   Participation Level: Active  Participation Quality: Attentive, Sharing and Supportive  Affect: Appropriate  Cognitive: Alert   Additional Comments: Patient joking with RT, reports that he hopes to be discharged today.   Christop Hippert 07/19/2011 1:06 PM

## 2011-07-19 NOTE — Discharge Summary (Signed)
Physician Discharge Summary  Patient ID: Brian [name] MRN: [id number] DOB/AGE: 04/17/1986 25 y.o.  Admit date: 06/29/2011 Discharge date: 07/19/2011  Admission Diagnoses: Psychosis                                        Substance dependency Discharge Diagnoses:  Principal Problem:  *Psychosis Active Problems:  Substance addiction   Discharged Condition: Alert, cooperative. Adamantly denies any suicidal or homicidal thoughts. Having no psychosis. Tolerating medications without side effects. Hospital Course: admitted to the adult milieu. Significant withdrawal symptoms. Started on clonidine protocol and seroquel for agitation, racing thoughts. At times he was agitated and anxious with a labile mood.  Had cravings for cocaine and opiods. He was committed for rehab. He showed poor judgement and insight. Started on depakote for impulsivity. Refused by [facility]. He had problems with EPS during his stay.   Discharge Exam: Blood pressure 130/88, pulse 97, temperature 97.4 F (36.3 C), temperature source Oral, resp. rate 18, height 5\' 11"  (1.803 m), weight 87.544 kg (193 lb). Disposition: Home or Self Care   Discharge Medication List as of 07/19/2011  1:23 PM    START taking these medications   Details  cloNIDine (CATAPRES) 0.1 MG tablet Take 1 tablet (0.1 mg total) by mouth 2 (two) times daily in the am and at bedtime.., Starting 07/19/2011, Until Tue 07/18/12, Print    divalproex (DEPAKOTE ER) 250 MG 24 hr tablet Take 5 tablets (1,250 mg total) by mouth at bedtime., Starting 07/19/2011, Until Tue 07/18/12, Print    traZODone (DESYREL) 100 MG tablet Take 1 tablet (100 mg total) by mouth at bedtime as needed for sleep., Starting 07/19/2011, Until Wed 08/18/11, No Print      CONTINUE these medications which have NOT CHANGED   Details  nicotine (NICODERM CQ - DOSED IN MG/24 HOURS) 21 mg/24hr patch Place 1 patch onto the skin daily.  , Until Discontinued, Historical Med      STOP taking  these medications     HYDROcodone-acetaminophen (VICODIN) 5-500 MG per tablet      Melatonin 3 MG TABS        Follow-up Information    Follow up with [facility] on 07/21/2011. (8-11AM [facility])    Contact information:   [address] [place]  Telephone:  [phone]         Signed: Mechele Dawley 07/19/2011, 3:54 PM

## 2011-07-19 NOTE — Progress Notes (Signed)
Patient ID: Brian [name], male   DOB: 03/24/1986, 25 y.o.   MRN: [id number] Pt verbalizes understanding of follow up and medications. Instructions were reviewed with patient and mother. Received medication samples and prescriptions. Pt denied SI/HI. Was calm and appropriate at time of d/c.

## 2011-07-19 NOTE — Progress Notes (Signed)
[facility] Case Management Discharge Plan:  Will you be returning to the same living situation after discharge: Yes,  live with mother Would you like a referral for services when you are discharged:Yes,  to [facility] Do you have access to transportation at discharge:Yes,  Mother to transport Do you have the ability to pay for your medications:Yes,  Mother and Vesta Mixer to help  Interagency Information:   Release of Information to [facility] to be signed  Patient to Follow up at:  Follow-up Information    Follow up with [facility] on 07/21/2011. (8-11AM Walk-In Clinic)    Contact information:   [address] [place]  Telephone:  [phone]         Patient denies SI/HI:   Yes,      Safety Planning and Suicide Prevention discussed:  Yes,  education given by CM to patient  Barrier to discharge identified:No.  Summary and Recommendations:  There are no remaining barriers to discharge.  [facility] called to inform re discharge.   Sarina Ser 07/19/2011, 12:45 PM

## 2011-07-19 NOTE — Progress Notes (Signed)
Suicide Risk Assessment  Discharge Assessment     Demographic factors:  Assessment Details Time of Assessment: Admission Information Obtained From: Patient Current Mental Status:  AO x 3. Risk Reduction Factors:  Risk Reduction Factors: Responsible for children under 27 years of age  CLINICAL FACTORS:   Previous Psychiatric Diagnoses and Treatments Medical Diagnoses and Treatments/Surgeries  Diagnosis:   Axis I: Psychotic Disorder - NOS   Sleep: The patient reports to sleeping well last night.  Appetite: Good.   Mild>(1-10) >Severe  Hopelessness (1-10): 3  Depression (1-10): 3  Anxiety (1-10): 3   Suicidal Ideation: The patient adamantly denies suicidal ideations today.  Plan: No  Intent: No  Means: No   Homicidal Ideation: The patient adamantly denies any homicidal ideations today.  Plan: No  Intent: No.  Means: No   Eye Contact: Good  General Appearance /Behavior: Neat and Casual  Motor Behavior: Normal  Speech: Normal  Mental Status: AO x 3  Level of Consciousness: Alert  Mood: Mildly Depressed.  Affect: Mildly Anxious.  Anxiety Mild.  Thought Process: Coherent  Thought Content: WNL. No auditory or visual hallucinations or delusional thinking.  Perception: Normal  Judgment: Fair to Good.  Insight: Fair to Good  Cognition: Orientation time, place and person.   Treatment Plan Summary:  1. Daily contact with patient to assess and evaluate symptoms and progress in treatment  2. Medication management  3. The patient will deny suicidal ideations or homicidal ideations for 48 hours prior to discharge and have a depression and anxiety rating of 3 or less. The patient will also deny any auditory or visual hallucinations or delusional thinking.   Plan:  1. Will continue current medications.  2. Discharge today to outpatient follow-up.  SUICIDE RISK:   Minimal: No identifiable suicidal ideation.  Patients presenting with no risk factors but with morbid  ruminations; may be classified as minimal risk based on the severity of the depressive symptoms   Brian [name] 07/19/2011, 1:03 PM

## 2011-07-19 NOTE — Tx Team (Signed)
Interdisciplinary Treatment Plan Update (Adult)  Date:  07/19/2011  Time Reviewed:  10:48 AM   Progress in Treatment: Attending groups:  Yes, participating Participating in groups:    Yes Taking medication as prescribed:    Yes Tolerating medication:   Yes Family/Significant othe contact made:  Yes, mother and sister Patient understands diagnosis:   Yes, with limted insight Discussing patient identified problems/goals with staff:   Yes, at length Medical problems stabilized or resolved:   Yes Denies suicidal/homicidal ideation:  Yes Issues/concerns per patient self-inventory:   None Other:  New problem(s) identified: Yes, Describe:  lining up substance abuse counseling difficult  Reason for Continuation of Hospitalization: None  Interventions implemented related to continuation of hospitalization:  None Additional comments:  Not applicable  Estimated length of stay:  D/C today  Discharge Plan:  Go home with mother, follow up at [facility], go to NA/AA meetings  New goal(s):  Not applicable  Review of initial/current patient goals per problem list:   1.  Goal(s):  Reduce depression from a 10 to 3  Met:  Yes  Target date:  By Discharge   As evidenced by:  "I actually feel happy"  2.  Goal (s):  Eliminate SI statements  Met:  Yes  Target date:  By Discharge   As evidenced by:  Has not felt suicidal, "I actually feel happy"  3.  Goal(s):  Reduce psychotic symptoms to baseline  Met:  Yes  Target date:  By Discharge   As evidenced by:  Logical and goal-directed  4.  Goal(s):  Address substance abuse issues  Met:  Yes  Target date:  By Discharge   As evidenced by:  States that mom will drug test him, and put him out if tests positive.  Would have to walk 5-10 miles to get to a place to buy them.  CM will talk to [facility] prior to D/C to ask for IPRS money for substance abuse counseling. Also recommend NA/AA meetings daily.  Attendees: Patient:  Brian [name]   07/19/2011  10:48 AM   Family:     Physician:  Dr. Harvie Heck Readling 07/19/2011  10:48 AM   Nursing:   Robbie Louis, RN 07/19/2011  10:48 AM   Case Manager:  Ambrose Mantle, LCSW 07/19/2011  10:48 AM   Counselor:  Veto Kemps, MT-BC 07/19/2011  10:48 AM   Other:  Everlene Balls, RN 07/19/2011  11:01 AM  Other:  Fransisca Kaufmann, RN 07/19/2011  11:01 AM   Other:     Other:      Scribe for Treatment Team:   Sarina Ser, 07/19/2011, 10:48 AM

## 2011-07-20 NOTE — Progress Notes (Signed)
Patient Discharge Instructions:  Dictated admission note faxed, Date faxed:  07/20/2011 D/C instructions faxed, Date faxed:  07/20/2011 D/C Summary faxed, Date faxed:  07/20/2011 Med. Rec. Form faxed, Date faxed:  07/20/2011 Facesheet faxed 07/20/2011  Faxed to [facility] [phone]  Wandra Scot, 07/20/2011, 2:46 PM

## 2011-07-29 ENCOUNTER — Encounter (HOSPITAL_COMMUNITY): Payer: Self-pay | Admitting: *Deleted

## 2011-07-29 ENCOUNTER — Emergency Department (HOSPITAL_COMMUNITY)
Admission: EM | Admit: 2011-07-29 | Discharge: 2011-07-29 | Disposition: A | Discharge status: Home / Self Care | Payer: Self-pay | Attending: Emergency Medicine | Admitting: Emergency Medicine

## 2011-07-29 DIAGNOSIS — F3289 Other specified depressive episodes: Secondary | ICD-10-CM | POA: Insufficient documentation

## 2011-07-29 DIAGNOSIS — R071 Chest pain on breathing: Secondary | ICD-10-CM | POA: Insufficient documentation

## 2011-07-29 DIAGNOSIS — R0789 Other chest pain: Secondary | ICD-10-CM

## 2011-07-29 DIAGNOSIS — F329 Major depressive disorder, single episode, unspecified: Secondary | ICD-10-CM | POA: Insufficient documentation
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
## 2011-07-31 NOTE — ED Provider Notes (Signed)
Medical screening examination/treatment/procedure(s) were performed by non-physician practitioner and as supervising physician I was immediately available for consultation/collaboration.  Raeford Razor, MD 07/31/11 [phone]

## 2012-06-25 ENCOUNTER — Encounter (HOSPITAL_COMMUNITY): Payer: Self-pay | Admitting: *Deleted

## 2012-06-25 ENCOUNTER — Emergency Department (HOSPITAL_COMMUNITY)
Admission: EM | Admit: 2012-06-25 | Discharge: 2012-06-25 | Disposition: A | Discharge status: Home / Self Care | Payer: Self-pay | Attending: Emergency Medicine | Admitting: Emergency Medicine

## 2012-06-25 ENCOUNTER — Emergency Department (HOSPITAL_COMMUNITY): Payer: Self-pay

## 2012-06-25 DIAGNOSIS — S91109A Unspecified open wound of unspecified toe(s) without damage to nail, initial encounter: Secondary | ICD-10-CM | POA: Insufficient documentation

## 2012-06-25 DIAGNOSIS — F3289 Other specified depressive episodes: Secondary | ICD-10-CM | POA: Insufficient documentation

## 2012-06-25 DIAGNOSIS — Z79899 Other long term (current) drug therapy: Secondary | ICD-10-CM | POA: Insufficient documentation

## 2012-06-25 DIAGNOSIS — F209 Schizophrenia, unspecified: Secondary | ICD-10-CM | POA: Insufficient documentation

## 2012-06-25 DIAGNOSIS — F4310 Post-traumatic stress disorder, unspecified: Secondary | ICD-10-CM | POA: Insufficient documentation

## 2012-06-25 DIAGNOSIS — S91119A Laceration without foreign body of unspecified toe without damage to nail, initial encounter: Secondary | ICD-10-CM

## 2012-06-25 DIAGNOSIS — F411 Generalized anxiety disorder: Secondary | ICD-10-CM | POA: Insufficient documentation

## 2012-06-25 DIAGNOSIS — R296 Repeated falls: Secondary | ICD-10-CM | POA: Insufficient documentation

## 2012-06-25 DIAGNOSIS — R55 Syncope and collapse: Secondary | ICD-10-CM | POA: Insufficient documentation

## 2012-06-25 DIAGNOSIS — F17200 Nicotine dependence, unspecified, uncomplicated: Secondary | ICD-10-CM | POA: Insufficient documentation

## 2012-06-25 DIAGNOSIS — F3160 Bipolar disorder, current episode mixed, unspecified: Secondary | ICD-10-CM | POA: Insufficient documentation

## 2012-06-25 DIAGNOSIS — W19XXXA Unspecified fall, initial encounter: Secondary | ICD-10-CM

## 2012-06-25 DIAGNOSIS — Y929 Unspecified place or not applicable: Secondary | ICD-10-CM | POA: Insufficient documentation

## 2012-06-25 DIAGNOSIS — F329 Major depressive disorder, single episode, unspecified: Secondary | ICD-10-CM | POA: Insufficient documentation

## 2012-06-25 DIAGNOSIS — Z23 Encounter for immunization: Secondary | ICD-10-CM | POA: Insufficient documentation

## 2012-06-25 DIAGNOSIS — W1809XA Striking against other object with subsequent fall, initial encounter: Secondary | ICD-10-CM | POA: Insufficient documentation

## 2012-06-25 DIAGNOSIS — Y9389 Activity, other specified: Secondary | ICD-10-CM | POA: Insufficient documentation

## 2012-06-25 HISTORY — DX: Bipolar disorder, unspecified: F31.9

## 2012-06-25 LAB — BASIC METABOLIC PANEL
GFR calc Af Amer: >90 mL/min (ref >90)
GFR calc non Af Amer: >90 mL/min (ref >90)
Potassium: 4.3 mEq/L (ref 3.5–5.1)
Sodium: 138 mEq/L (ref 135–145)

## 2012-06-25 LAB — URINALYSIS, ROUTINE W REFLEX MICROSCOPIC
Glucose, UA: NEGATIVE mg/dL
Leukocytes, UA: NEGATIVE
Specific Gravity, Urine: 1.011 (ref 1.005–1.030)
pH: 7 (ref 5.0–8.0)

## 2012-06-25 LAB — RAPID URINE DRUG SCREEN, HOSP PERFORMED
Benzodiazepines: NOT DETECTED
Cocaine: NOT DETECTED
Opiates: NOT DETECTED

## 2012-06-25 LAB — CBC
Hemoglobin: 16.2 g/dL (ref 13.0–17.0)
RBC: 5.23 MIL/uL (ref 4.22–5.81)

## 2012-06-25 LAB — HEPATIC FUNCTION PANEL
ALT: 17 U/L (ref 0–53)
AST: 32 U/L (ref 0–37)
Bilirubin, Direct: <0.1 mg/dL (ref 0.0–0.3)
Total Bilirubin: 0.4 mg/dL (ref 0.3–1.2)

## 2012-06-25 LAB — GLUCOSE, CAPILLARY: Glucose-Capillary: 112 mg/dL — ABNORMAL HIGH (ref 70–99)

## 2012-06-25 LAB — VALPROIC ACID LEVEL: Valproic Acid Lvl: 70.9 ug/mL (ref 50.0–100.0)

## 2012-06-25 LAB — ETHANOL: Alcohol, Ethyl (B): <11 mg/dL (ref 0–11)

## 2012-06-25 MED ORDER — TETANUS-DIPHTH-ACELL PERTUSSIS 5-2.5-18.5 LF-MCG/0.5 IM SUSP
0.5000 mL | Freq: Once | INTRAMUSCULAR | Status: AC
  Administered 2012-06-25: 0.5 mL via INTRAMUSCULAR
  Filled 2012-06-25: qty 0.5

## 2012-06-25 MED ORDER — CEPHALEXIN 500 MG PO CAPS: 500.0000 mg | ORAL_CAPSULE | Freq: Three times a day (TID) | ORAL | Status: DC

## 2012-06-25 NOTE — ED Notes (Signed)
XBJ:[id number]<NF> Expected date:06/25/12<BR> Expected time: 8:36 AM<BR> Means of arrival:Ambulance<BR> Comments:<BR> Multiple falls

## 2012-06-25 NOTE — ED Notes (Signed)
Pt alert and oriented x4. Respirations even and unlabored, bilateral symmetrical rise and fall of chest. Skin warm and dry. In no acute distress. Denies needs.   

## 2012-06-25 NOTE — ED Notes (Addendum)
Per ems pt is from home. Family reports pt fell earlier this morning around 0300 and then fell again around 0800. Pt reported to ems that "he passed out".  Family reported pt has been having inner ear problems.  Pt is on depakote, family did not report pt has hx of seizures. Pt has a psych hx, schizophrenia and bipolar. Mother reports he is at [facility] every 2 weeks.  Pt is not showing any signs of postictal. Alert and oriented x4.   Upon further assessment. pts mother present. Pt reports he passed out. Hit his head, front and back. Pt urinated on himself. Reports pain neck and back 10/10. Reports he also drinks and smokes. States "only one beer a day".

## 2012-06-25 NOTE — ED Provider Notes (Signed)
History     CSN: [id number]  Arrival date & time 06/25/12  [id number]   First MD Initiated Contact with Patient 06/25/12 [phone]      Chief Complaint  Patient presents with  . Loss of Consciousness  . Fall    (Consider location/radiation/quality/duration/timing/severity/associated sxs/prior treatment) Patient is a 26 y.o. male presenting with syncope and fall. The history is provided by the patient.  Loss of Consciousness Pertinent negatives include no chest pain, no abdominal pain, no headaches and no shortness of breath.  Fall Pertinent negatives include no fever, no numbness, no abdominal pain and no headaches.  pt states 'passed out' during early morning hours going to bathroom. Denies hx same. Denies seizure or seizure hx. Family states mental status after fall/episode and currently is at baseline. No incontinence, no postictal period, no oral injury. Pt denies recent change in meds or new meds, states is compliant w meds. Hx schizophrenia and bipolar disorder - denies any recent new or exacerbated symptoms, no hallucinations/delusions, denies depression. Pt states w fall hit head. Denies headache. Denies neck or back pain. No numbness/weakness. States recent health at baseline, eating normally. Denies recent febrile or viral illness. No uri c/o. No cp or discomfort. No sob. No palpitations or irregular heart beat. No hx dysrhythmia. Denies nvd. No rectal bleeding or melena. No gu c/o. Pt states w fall did injury right foot/toe. Laceration to plantar aspect base 4th toe. Tetanus unknown.       Past Medical History  Diagnosis Date  . PTSD (post-traumatic stress disorder)   . Anxiety   . Depression   . Bipolar 1 disorder   . Schizophrenia     No past surgical history on file.  No family history on file.  History  Substance Use Topics  . Smoking status: Current Every Day Smoker    Types: Cigarettes  . Smokeless tobacco: Not on file  . Alcohol Use: No      Review of Systems    Constitutional: Negative for fever and chills.  HENT: Negative for neck pain and neck stiffness.   Eyes: Negative for pain and visual disturbance.  Respiratory: Negative for cough and shortness of breath.   Cardiovascular: Positive for syncope. Negative for chest pain, palpitations and leg swelling.  Gastrointestinal: Negative for abdominal pain.  Genitourinary: Negative for flank pain.  Musculoskeletal: Negative for back pain and gait problem.  Skin: Positive for wound. Negative for rash.  Neurological: Negative for seizures, speech difficulty, weakness, numbness and headaches.  Hematological: Does not bruise/bleed easily.  Psychiatric/Behavioral: Negative for confusion.    Allergies  Risperdal and Phenytoin sodium extended  Home Medications   Current Outpatient Rx  Name  Route  Sig  Dispense  Refill  . CLONIDINE HCL 0.1 MG PO TABS   Oral   Take 0.1 mg by mouth 2 (two) times daily.         Marland Kitchen DIVALPROEX SODIUM ER 500 MG PO TB24   Oral   Take 1,500 mg by mouth at bedtime.         . FLUOXETINE HCL 20 MG PO CAPS   Oral   Take 20 mg by mouth every morning.         Marland Kitchen HYDROXYZINE PAMOATE 25 MG PO CAPS   Oral   Take 25-100 mg by mouth daily as needed.         Marland Kitchen LURASIDONE HCL 80 MG PO TABS   Oral   Take 80 mg by mouth daily with supper.         Marland Kitchen  TRAZODONE HCL 100 MG PO TABS   Oral   Take 100 mg by mouth at bedtime.         . TRAZODONE HCL 100 MG PO TABS   Oral   Take 1 tablet (100 mg total) by mouth at bedtime as needed for sleep.   15 tablet   0     BP 156/90  Pulse 63  Temp 98 F (36.7 C) (Oral)  Resp 21  SpO2 98%  Physical Exam  Nursing note and vitals reviewed. Constitutional: He is oriented to person, place, and time. He appears well-developed and well-nourished. No distress.  HENT:  Head: Atraumatic.  Nose: Nose normal.  Mouth/Throat: Oropharynx is clear and moist.       No oral trauma or tongue contusion  Eyes: Pupils are equal,  round, and reactive to light.  Neck: Neck supple. No tracheal deviation present.       Ccollar, mid cervical tenderness  Cardiovascular: Normal rate, regular rhythm, normal heart sounds and intact distal pulses.   Pulmonary/Chest: Effort normal and breath sounds normal. No accessory muscle usage. No respiratory distress. He exhibits no tenderness.  Abdominal: Soft. Bowel sounds are normal. He exhibits no distension and no mass. There is no tenderness. There is no rebound and no guarding.  Genitourinary:       No cva tenderness  Musculoskeletal: Normal range of motion. He exhibits no edema.       1 cm laceration to plantar aspect base of right third toe and base right fourth toe. Normal cap refill distally. Able to move/flex and extend toes. No bony exposed. No fb seen or felt. Tenderness over toes and distal foot. No other skin lacs noted. Ankle stable. No malleolar tenderness. Good rom bil ext without pain, no other focal bony tenderness noted. Mid cervical tenderness, otherwise CTLS spine, non tender, aligned, no step off.   Neurological: He is alert and oriented to person, place, and time.       Motor intact bil. Steady gait.   Skin: Skin is warm and dry.  Psychiatric: He has a normal mood and affect.    ED Course  Procedures (including critical care time)  Results for orders placed during the hospital encounter of 06/25/12  GLUCOSE, CAPILLARY      Component Value Range   Glucose-Capillary 112 (*) 70 - 99 mg/dL   Comment 1 Documented in Chart     Comment 2 Notify RN    BASIC METABOLIC PANEL      Component Value Range   Sodium 138  135 - 145 mEq/L   Potassium 4.3  3.5 - 5.1 mEq/L   Chloride 108  96 - 112 mEq/L   CO2 19  19 - 32 mEq/L   Glucose, Bld 115 (*) 70 - 99 mg/dL   BUN 5 (*) 6 - 23 mg/dL   Creatinine, Ser 4.09  0.50 - 1.35 mg/dL   Calcium 9.0  8.4 - 81.1 mg/dL   GFR calc non Af Amer >90  >90 mL/min   GFR calc Af Amer >90  >90 mL/min  CBC      Component Value Range    WBC 5.4  4.0 - 10.5 K/uL   RBC 5.23  4.22 - 5.81 MIL/uL   Hemoglobin 16.2  13.0 - 17.0 g/dL   HCT 91.4  78.2 - 95.6 %   MCV 83.7  78.0 - 100.0 fL   MCH 31.0  26.0 - 34.0 pg   MCHC 37.0 (*) 30.0 -  36.0 g/dL   RDW 16.1  09.6 - 04.5 %   Platelets 218  150 - 400 K/uL  URINALYSIS, ROUTINE W REFLEX MICROSCOPIC      Component Value Range   Color, Urine YELLOW  YELLOW   APPearance CLEAR  CLEAR   Specific Gravity, Urine 1.011  1.005 - 1.030   pH 7.0  5.0 - 8.0   Glucose, UA NEGATIVE  NEGATIVE mg/dL   Hgb urine dipstick NEGATIVE  NEGATIVE   Bilirubin Urine NEGATIVE  NEGATIVE   Ketones, ur NEGATIVE  NEGATIVE mg/dL   Protein, ur NEGATIVE  NEGATIVE mg/dL   Urobilinogen, UA 1.0  0.0 - 1.0 mg/dL   Nitrite NEGATIVE  NEGATIVE   Leukocytes, UA NEGATIVE  NEGATIVE  ETHANOL      Component Value Range   Alcohol, Ethyl (B) <11  0 - 11 mg/dL  URINE RAPID DRUG SCREEN (HOSP PERFORMED)      Component Value Range   Opiates NONE DETECTED  NONE DETECTED   Cocaine NONE DETECTED  NONE DETECTED   Benzodiazepines NONE DETECTED  NONE DETECTED   Amphetamines NONE DETECTED  NONE DETECTED   Tetrahydrocannabinol NONE DETECTED  NONE DETECTED   Barbiturates NONE DETECTED  NONE DETECTED  VALPROIC ACID LEVEL      Component Value Range   Valproic Acid Lvl 70.9  50.0 - 100.0 ug/mL  HEPATIC FUNCTION PANEL      Component Value Range   Total Protein 6.8  6.0 - 8.3 g/dL   Albumin 3.7  3.5 - 5.2 g/dL   AST 32  0 - 37 U/L   ALT 17  0 - 53 U/L   Alkaline Phosphatase 72  39 - 117 U/L   Total Bilirubin 0.4  0.3 - 1.2 mg/dL   Bilirubin, Direct <4.0  0.0 - 0.3 mg/dL   Indirect Bilirubin NOT CALCULATED  0.3 - 0.9 mg/dL   Ct Head Wo Contrast  06/25/2012  *RADIOLOGY REPORT*  Clinical Data:  Loss of consciousness.  History of fall.  CT HEAD WITHOUT CONTRAST CT CERVICAL SPINE WITHOUT CONTRAST  Technique:  Multidetector CT imaging of the head and cervical spine was performed following the standard protocol without intravenous  contrast.  Multiplanar CT image reconstructions of the cervical spine were also generated.  Comparison:  No prior.  CT HEAD  Findings: No acute displaced skull fractures are identified.  No acute intracranial abnormality.  Specifically, no evidence of acute post-traumatic intracranial hemorrhage, no definite regions of acute/subacute cerebral ischemia, no focal mass, mass effect, hydrocephalus or abnormal intra or extra-axial fluid collections. The visualized paranasal sinuses and mastoids are well pneumatized.  IMPRESSION: 1.  No acute displaced skull fractures or acute intracranial abnormalities. 2.  The appearance of the brain is normal.  CT CERVICAL SPINE  Findings: No acute displaced cervical spine fractures are identified.  Reversal of normal cervical lordosis, presumably positional.  No associated prevertebral soft tissue swelling. Alignment is otherwise anatomic.  IMPRESSION: 1.  No evidence of significant acute traumatic injury to the cervical spine.   Original Report Authenticated By: Trudie Reed, M.D.    Ct Cervical Spine Wo Contrast  06/25/2012  *RADIOLOGY REPORT*  Clinical Data:  Loss of consciousness.  History of fall.  CT HEAD WITHOUT CONTRAST CT CERVICAL SPINE WITHOUT CONTRAST  Technique:  Multidetector CT imaging of the head and cervical spine was performed following the standard protocol without intravenous contrast.  Multiplanar CT image reconstructions of the cervical spine were also generated.  Comparison:  No  prior.  CT HEAD  Findings: No acute displaced skull fractures are identified.  No acute intracranial abnormality.  Specifically, no evidence of acute post-traumatic intracranial hemorrhage, no definite regions of acute/subacute cerebral ischemia, no focal mass, mass effect, hydrocephalus or abnormal intra or extra-axial fluid collections. The visualized paranasal sinuses and mastoids are well pneumatized.  IMPRESSION: 1.  No acute displaced skull fractures or acute intracranial  abnormalities. 2.  The appearance of the brain is normal.  CT CERVICAL SPINE  Findings: No acute displaced cervical spine fractures are identified.  Reversal of normal cervical lordosis, presumably positional.  No associated prevertebral soft tissue swelling. Alignment is otherwise anatomic.  IMPRESSION: 1.  No evidence of significant acute traumatic injury to the cervical spine.   Original Report Authenticated By: Trudie Reed, M.D.    Dg Foot Complete Right  06/25/2012  *RADIOLOGY REPORT*  Clinical Data: Right-sided foot pain.  RIGHT FOOT COMPLETE - 3+ VIEW  Comparison: No priors.  Findings: Three views of the right foot demonstrate no acute fracture, subluxation, dislocation, joint or soft tissue abnormality.  IMPRESSION: 1.  No acute radiographic abnormality of the right foot.   Original Report Authenticated By: Trudie Reed, M.D.       MDM  Monitor. Ecg. Ct. Labs.  negative  Tetanus im.  Sutured wound.    Date: 06/25/2012  Rate: 66  Rhythm: normal sinus rhythm  QRS Axis: normal  Intervals: normal  ST/T Wave abnormalities: normal  Conduction Disutrbances:none  Narrative Interpretation:   Old EKG Reviewed: unchanged   LACERATION REPAIR Performed by: Suzi Roots Authorized by: Suzi Roots Consent: Verbal consent obtained. Risks and benefits: risks, benefits and alternatives were discussed Consent given by: patient Patient identity confirmed: provided demographic data Prepped and Draped in normal sterile fashion Wound explored  Laceration Location: right 3rd and 4th toes  Laceration Length:  2cm  No Foreign Bodies seen or palpated  Anesthesia: local infiltration  Local anesthetic: lidocaine 2% wo epinephrine  Anesthetic total: 3 ml  Irrigation method: syringe Amount of cleaning: standard  Skin closure: 4-0 nylon   Number of sutures: 6  Technique: simple interrupted  Patient tolerance: Patient tolerated the procedure well with no immediate  complications.   Recheck spine non tender. Pt alert, content, no c/o. No pain.   Sterile dressing to sutured wounds foot. Given sutured wounds on plantar aspect foot, will give abx rx to help avoid/prevent wound infection.  On monitor, no dysrhythmia noted. No faintness or dizziness.   Discussed need close pcp follow up.      Suzi Roots, MD 06/25/12 1250

## 2012-06-25 NOTE — ED Notes (Signed)
Pt and family verbalized understanding d/c instructions. Escorted to d/c window. In no acute distress.

## 2012-06-25 NOTE — ED Notes (Signed)
md at bedside  Pt alert and oriented x4. Respirations even and unlabored, bilateral symmetrical rise and fall of chest. Skin warm and dry. In no acute distress. Denies needs.   

## 2012-07-04 ENCOUNTER — Emergency Department (HOSPITAL_COMMUNITY)
Admission: EM | Admit: 2012-07-04 | Discharge: 2012-07-05 | Disposition: A | Discharge status: Home / Self Care | Payer: Self-pay | Attending: Emergency Medicine | Admitting: Emergency Medicine

## 2012-07-04 ENCOUNTER — Encounter (HOSPITAL_COMMUNITY): Payer: Self-pay | Admitting: Emergency Medicine

## 2012-07-04 DIAGNOSIS — F411 Generalized anxiety disorder: Secondary | ICD-10-CM | POA: Insufficient documentation

## 2012-07-04 DIAGNOSIS — F4310 Post-traumatic stress disorder, unspecified: Secondary | ICD-10-CM | POA: Insufficient documentation

## 2012-07-04 DIAGNOSIS — F319 Bipolar disorder, unspecified: Secondary | ICD-10-CM | POA: Insufficient documentation

## 2012-07-04 DIAGNOSIS — F209 Schizophrenia, unspecified: Secondary | ICD-10-CM | POA: Insufficient documentation

## 2012-07-04 DIAGNOSIS — F329 Major depressive disorder, single episode, unspecified: Secondary | ICD-10-CM | POA: Insufficient documentation

## 2012-07-04 DIAGNOSIS — F17200 Nicotine dependence, unspecified, uncomplicated: Secondary | ICD-10-CM | POA: Insufficient documentation

## 2012-07-04 DIAGNOSIS — Z79899 Other long term (current) drug therapy: Secondary | ICD-10-CM | POA: Insufficient documentation

## 2012-07-04 DIAGNOSIS — F3289 Other specified depressive episodes: Secondary | ICD-10-CM | POA: Insufficient documentation
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
## 2012-07-05 ENCOUNTER — Emergency Department (HOSPITAL_COMMUNITY): Payer: Self-pay

## 2012-07-05 LAB — CBC WITH DIFFERENTIAL/PLATELET
Basophils Absolute: 0 10*3/uL (ref 0.0–0.1)
Basophils Relative: 0 % (ref 0–1)
Eosinophils Absolute: 0.1 10*3/uL (ref 0.0–0.7)
Eosinophils Relative: 2 % (ref 0–5)
HCT: 42.2 % (ref 39.0–52.0)
MCH: 31.6 pg (ref 26.0–34.0)
MCHC: 36.5 g/dL — ABNORMAL HIGH (ref 30.0–36.0)
MCV: 86.5 fL (ref 78.0–100.0)
Monocytes Absolute: 0.5 10*3/uL (ref 0.1–1.0)
RDW: 12.7 % (ref 11.5–15.5)

## 2012-07-05 LAB — COMPREHENSIVE METABOLIC PANEL
ALT: 19 U/L (ref 0–53)
Albumin: 3.8 g/dL (ref 3.5–5.2)
Alkaline Phosphatase: 59 U/L (ref 39–117)
GFR calc Af Amer: >90 mL/min (ref >90)
Glucose, Bld: 75 mg/dL (ref 70–99)
Potassium: 3.9 mEq/L (ref 3.5–5.1)
Sodium: 137 mEq/L (ref 135–145)
Total Protein: 6.9 g/dL (ref 6.0–8.3)

## 2012-07-05 LAB — RAPID URINE DRUG SCREEN, HOSP PERFORMED: Amphetamines: NOT DETECTED

## 2012-07-05 NOTE — ED Provider Notes (Signed)
History     CSN: [id number]  Arrival date & time 07/04/12  2342   First MD Initiated Contact with Patient 07/05/12 0006      Chief Complaint  Patient presents with  . Medical Clearance    (Consider location/radiation/quality/duration/timing/severity/associated sxs/prior treatment) HPI Comments: Brought from [facility] for medical clearance.  Was petitioned by Mother who found him unresponsive with air canister in his mouth He states he never tried compressed air before, was using it to get high, states more depressed than normal but denies SI/HI  States has been taking medication, was last seen by counselor 2 weeks ago   The history is provided by the patient.    Past Medical History  Diagnosis Date  . PTSD (post-traumatic stress disorder)   . Anxiety   . Depression   . Bipolar 1 disorder   . Schizophrenia     History reviewed. No pertinent past surgical history.  History reviewed. No pertinent family history.  History  Substance Use Topics  . Smoking status: Current Every Day Smoker    Types: Cigarettes  . Smokeless tobacco: Not on file  . Alcohol Use: No      Review of Systems  Constitutional: Negative for fever and chills.  HENT: Negative for neck pain.   Respiratory: Negative for shortness of breath.   Cardiovascular: Negative for chest pain.  Gastrointestinal: Negative.   Skin: Negative.   Neurological: Negative for dizziness, weakness and headaches.  Psychiatric/Behavioral: Negative for suicidal ideas, sleep disturbance and self-injury.    Allergies  Risperdal and Phenytoin sodium extended  Home Medications   Current Outpatient Rx  Name  Route  Sig  Dispense  Refill  . CEPHALEXIN 500 MG PO CAPS   Oral   Take 500 mg by mouth 3 (three) times daily. For unknown amount of days         . CLONIDINE HCL 0.1 MG PO TABS   Oral   Take 0.1 mg by mouth 2 (two) times daily.         Marland Kitchen DIVALPROEX SODIUM ER 500 MG PO TB24   Oral   Take 1,500 mg by  mouth at bedtime.         . FLUOXETINE HCL 20 MG PO CAPS   Oral   Take 20 mg by mouth every morning.         Marland Kitchen HYDROXYZINE PAMOATE 25 MG PO CAPS   Oral   Take 25-100 mg by mouth daily as needed. For anxiety         . LURASIDONE HCL 80 MG PO TABS   Oral   Take 80 mg by mouth daily with supper.         . TRAZODONE HCL 100 MG PO TABS   Oral   Take 100 mg by mouth at bedtime.           BP 118/73  Pulse 78  Temp 97 F (36.1 C) (Oral)  Resp 20  SpO2 100%  Physical Exam  Constitutional: He appears well-developed and well-nourished.  HENT:  Head: Normocephalic.  Eyes: Pupils are equal, round, and reactive to light.  Neck: Normal range of motion.  Cardiovascular: Normal rate.   Pulmonary/Chest: Effort normal. He has no wheezes. He has no rales.  Musculoskeletal: Normal range of motion. He exhibits no edema.  Neurological: He is alert.  Skin: Skin is warm. No rash noted.  Psychiatric: His speech is delayed. He is slowed. Cognition and memory are normal. He expresses inappropriate judgment.  He exhibits a depressed mood. He expresses no homicidal and no suicidal ideation. He expresses no suicidal plans and no homicidal plans.    ED Course  Procedures (including critical care time)  Labs Reviewed  CBC WITH DIFFERENTIAL - Abnormal; Notable for the following:    MCHC 36.5 (*)     Neutrophils Relative 42 (*)     Lymphocytes Relative 49 (*)     All other components within normal limits  URINE RAPID DRUG SCREEN (HOSP PERFORMED)  COMPREHENSIVE METABOLIC PANEL  VALPROIC ACID LEVEL   Dg Chest 2 View  07/05/2012  *RADIOLOGY REPORT*  Clinical Data: Inhalant use.  Medical clearance.  CHEST - 2 VIEW  Comparison: None.  Findings: Heart and mediastinal contours are within normal limits. No focal opacities or effusions.  No acute bony abnormality.  IMPRESSION: No active cardiopulmonary disease.   Original Report Authenticated By: Charlett Nose, M.D.   ED ECG REPORT   Date:  07/05/2012  EKG Time: 2:00 AM  Rate: 79  Rhythm: normal sinus rhythm,  unchanged from previous tracings  Axis: normal  Intervals:none  ST&T Change: none  Narrative Interpretation: abnormal but stable              1. Depression       MDM  Will monitor patient check labs         Arman Filter, NP 07/05/12 0020  Arman Filter, NP 07/05/12 0125  Arman Filter, NP 07/05/12 0201

## 2012-07-05 NOTE — ED Notes (Signed)
Pt very evasive in conversation   Pt states he does not know why he is here  Pt states he was trying to get high but has no intention of harming self   Pt states he is very tired and wants to be left alone  Pt states he has stitches in his foot from a seizure he had a few days ago  Pt states he has hx of seizures and is supposed to be on medication for them but will not say when he last had any only states it should be in his papers that came with him

## 2012-07-05 NOTE — ED Provider Notes (Signed)
Medical screening examination/treatment/procedure(s) were performed by non-physician practitioner and as supervising physician I was immediately available for consultation/collaboration. Devoria Albe, MD, Armando Gang   Ward Givens, MD 07/05/12 [phone]

## 2012-07-05 NOTE — ED Notes (Signed)
Returned from xray

## 2012-11-08 ENCOUNTER — Encounter (HOSPITAL_COMMUNITY): Payer: Self-pay | Admitting: *Deleted

## 2012-11-08 ENCOUNTER — Emergency Department (HOSPITAL_COMMUNITY)
Admission: EM | Admit: 2012-11-08 | Discharge: 2012-11-09 | Disposition: A | Discharge status: Home / Self Care | Payer: PRIVATE HEALTH INSURANCE | Attending: Emergency Medicine | Admitting: Emergency Medicine

## 2012-11-08 DIAGNOSIS — Z79899 Other long term (current) drug therapy: Secondary | ICD-10-CM | POA: Insufficient documentation

## 2012-11-08 DIAGNOSIS — R45851 Suicidal ideations: Secondary | ICD-10-CM

## 2012-11-08 DIAGNOSIS — F209 Schizophrenia, unspecified: Secondary | ICD-10-CM | POA: Insufficient documentation

## 2012-11-08 DIAGNOSIS — Z8719 Personal history of other diseases of the digestive system: Secondary | ICD-10-CM | POA: Insufficient documentation

## 2012-11-08 DIAGNOSIS — Z859 Personal history of malignant neoplasm, unspecified: Secondary | ICD-10-CM | POA: Insufficient documentation

## 2012-11-08 DIAGNOSIS — F17200 Nicotine dependence, unspecified, uncomplicated: Secondary | ICD-10-CM | POA: Insufficient documentation

## 2012-11-08 DIAGNOSIS — F411 Generalized anxiety disorder: Secondary | ICD-10-CM | POA: Insufficient documentation

## 2012-11-08 DIAGNOSIS — F4310 Post-traumatic stress disorder, unspecified: Secondary | ICD-10-CM | POA: Insufficient documentation

## 2012-11-08 DIAGNOSIS — F1810 Inhalant abuse, uncomplicated: Secondary | ICD-10-CM

## 2012-11-08 DIAGNOSIS — F1020 Alcohol dependence, uncomplicated: Secondary | ICD-10-CM

## 2012-11-08 DIAGNOSIS — F10929 Alcohol use, unspecified with intoxication, unspecified: Secondary | ICD-10-CM

## 2012-11-08 DIAGNOSIS — F319 Bipolar disorder, unspecified: Secondary | ICD-10-CM

## 2012-11-08 LAB — CBC WITH DIFFERENTIAL/PLATELET
Eosinophils Relative: 0 % (ref 0–5)
HCT: 45.3 % (ref 39.0–52.0)
Lymphocytes Relative: 56 % — ABNORMAL HIGH (ref 12–46)
Lymphs Abs: 3.2 10*3/uL (ref 0.7–4.0)
MCV: 89.2 fL (ref 78.0–100.0)
Monocytes Absolute: 0.5 10*3/uL (ref 0.1–1.0)
Neutro Abs: 2 10*3/uL (ref 1.7–7.7)
Platelets: 255 10*3/uL (ref 150–400)
RBC: 5.08 MIL/uL (ref 4.22–5.81)
WBC: 5.7 10*3/uL (ref 4.0–10.5)

## 2012-11-08 LAB — RAPID URINE DRUG SCREEN, HOSP PERFORMED
Amphetamines: NOT DETECTED
Benzodiazepines: NOT DETECTED
Opiates: NOT DETECTED
Tetrahydrocannabinol: NOT DETECTED

## 2012-11-08 LAB — COMPREHENSIVE METABOLIC PANEL
Albumin: 3.9 g/dL (ref 3.5–5.2)
BUN: 3 mg/dL — ABNORMAL LOW (ref 6–23)
Creatinine, Ser: 0.95 mg/dL (ref 0.50–1.35)
GFR calc Af Amer: >90 mL/min (ref >90)
Total Protein: 7.5 g/dL (ref 6.0–8.3)

## 2012-11-08 MED ORDER — ONDANSETRON HCL 4 MG PO TABS: 4.0000 mg | ORAL_TABLET | Freq: Three times a day (TID) | ORAL | Status: DC | PRN

## 2012-11-08 MED ORDER — ADULT MULTIVITAMIN W/MINERALS CH: 1.0000 | ORAL_TABLET | Freq: Every day | ORAL | Status: DC

## 2012-11-08 MED ORDER — FOLIC ACID 1 MG PO TABS: 1.0000 mg | ORAL_TABLET | Freq: Every day | ORAL | Status: DC

## 2012-11-08 MED ORDER — ALUM & MAG HYDROXIDE-SIMETH 200-200-20 MG/5ML PO SUSP: 30.0000 mL | ORAL | Status: DC | PRN

## 2012-11-08 MED ORDER — THIAMINE HCL 100 MG/ML IJ SOLN: 100.0000 mg | Freq: Every day | INTRAMUSCULAR | Status: DC

## 2012-11-08 MED ORDER — TRAZODONE HCL 100 MG PO TABS
100.0000 mg | ORAL_TABLET | Freq: Every day | ORAL | Status: DC
  Administered 2012-11-08: 100 mg via ORAL
  Filled 2012-11-08: qty 1

## 2012-11-08 MED ORDER — CLONIDINE HCL 0.1 MG PO TABS
0.1000 mg | ORAL_TABLET | Freq: Two times a day (BID) | ORAL | Status: DC
  Administered 2012-11-08: 0.1 mg via ORAL
  Filled 2012-11-08: qty 1

## 2012-11-08 MED ORDER — DIVALPROEX SODIUM ER 500 MG PO TB24
1500.0000 mg | ORAL_TABLET | Freq: Every day | ORAL | Status: DC
  Administered 2012-11-08: 1500 mg via ORAL
  Filled 2012-11-08 (×2): qty 3

## 2012-11-08 MED ORDER — LORAZEPAM 2 MG/ML IJ SOLN: 1.0000 mg | Freq: Four times a day (QID) | INTRAMUSCULAR | Status: DC | PRN

## 2012-11-08 MED ORDER — FLUOXETINE HCL 20 MG PO CAPS
20.0000 mg | ORAL_CAPSULE | Freq: Every morning | ORAL | Status: DC
  Filled 2012-11-08: qty 1

## 2012-11-08 MED ORDER — LURASIDONE HCL 80 MG PO TABS
80.0000 mg | ORAL_TABLET | Freq: Every morning | ORAL | Status: DC
  Filled 2012-11-08: qty 1

## 2012-11-08 MED ORDER — SERTRALINE HCL 50 MG PO TABS: 25.0000 mg | ORAL_TABLET | Freq: Every morning | ORAL | Status: DC

## 2012-11-08 MED ORDER — IBUPROFEN 600 MG PO TABS: 600.0000 mg | ORAL_TABLET | Freq: Three times a day (TID) | ORAL | Status: DC | PRN

## 2012-11-08 MED ORDER — NICOTINE 21 MG/24HR TD PT24: 21.0000 mg | MEDICATED_PATCH | Freq: Every day | TRANSDERMAL | Status: DC

## 2012-11-08 MED ORDER — ACETAMINOPHEN 325 MG PO TABS: 650.0000 mg | ORAL_TABLET | ORAL | Status: DC | PRN

## 2012-11-08 MED ORDER — LORAZEPAM 1 MG PO TABS: 1.0000 mg | ORAL_TABLET | Freq: Four times a day (QID) | ORAL | Status: DC | PRN

## 2012-11-08 MED ORDER — VITAMIN B-1 100 MG PO TABS: 100.0000 mg | ORAL_TABLET | Freq: Every day | ORAL | Status: DC

## 2012-11-08 NOTE — ED Provider Notes (Signed)
History    This chart was scribed for non-physician practitioner working with Celene Kras, MD by Leone Payor, ED Scribe. This patient was seen in room [facility] and the patient's care was started at 2132.   CSN: [id number]  Arrival date & time 11/08/12  2132   First MD Initiated Contact with Patient 11/08/12 2154      Chief Complaint  Patient presents with  . Medical Clearance     The history is provided by the patient. No language interpreter was used.    Brian [name] is a 27 y.o. male with h/o PTSD, anxiety, depression, bipolar 1 disorder, schizophrenia who presents to the Emergency Department requesting medical clearance tonight. Pt was brought in by police, and according to them pt was found huffing computer duster. Mother called police because pt was threatening suicide. Pt denies having any suicidal thoughts. Pt states he huffs inhalents "once in a while". Pt is currently intoxicated. Pt states in 2006 something was removed from right leg. Hospital called pt for him to return but he never did. Pt thinks it was cancer but is not dead yet.    Pt is a current everyday smoker and occasional alcohol user.  Past Medical History  Diagnosis Date  . PTSD (post-traumatic stress disorder)   . Anxiety   . Depression   . Bipolar 1 disorder   . Schizophrenia   . Hepatitis   . Cancer     History reviewed. No pertinent past surgical history.  No family history on file.  History  Substance Use Topics  . Smoking status: Current Every Day Smoker    Types: Cigarettes  . Smokeless tobacco: Not on file  . Alcohol Use: No      Review of Systems A complete 10 system review of systems was obtained and all systems are negative except as noted in the HPI and PMH.   Allergies  Phenytoin sodium extended; Pineapple; and Risperdal  Home Medications   Current Outpatient Rx  Name  Route  Sig  Dispense  Refill  . cloNIDine (CATAPRES) 0.1 MG tablet   Oral   Take 0.1 mg by mouth 2  (two) times daily.         . divalproex (DEPAKOTE ER) 500 MG 24 hr tablet   Oral   Take 1,500 mg by mouth at bedtime.         Marland Kitchen FLUoxetine (PROZAC) 20 MG capsule   Oral   Take 20 mg by mouth every morning.         . hydrOXYzine (VISTARIL) 25 MG capsule   Oral   Take 25-100 mg by mouth 2 (two) times daily as needed for anxiety. For anxiety         . ibuprofen (ADVIL,MOTRIN) 600 MG tablet   Oral   Take 600 mg by mouth every 6 (six) hours as needed for pain.         Marland Kitchen lurasidone (LATUDA) 80 MG TABS   Oral   Take 80 mg by mouth every morning.          Marland Kitchen PRESCRIPTION MEDICATION   Oral   Take 1 tablet by mouth daily. Antibiotic given after dental procedure         . sertraline (ZOLOFT) 25 MG tablet   Oral   Take 25 mg by mouth every morning.         . traZODone (DESYREL) 100 MG tablet   Oral   Take 100 mg by mouth at bedtime.  BP 102/72  Pulse 106  Temp(Src) 97.7 F (36.5 C) (Oral)  Resp 18  SpO2 92%  Physical Exam  Nursing note and vitals reviewed. Constitutional: He is oriented to person, place, and time. He appears well-developed and well-nourished. No distress.  Currently intoxicated.  HENT:  Head: Normocephalic and atraumatic.  Eyes: EOM are normal.  Neck: Neck supple. No tracheal deviation present.  Cardiovascular: Normal rate.   Pulmonary/Chest: Effort normal. No respiratory distress.  Musculoskeletal: Normal range of motion.  Neurological: He is alert and oriented to person, place, and time.  Skin: Skin is warm and dry.  Psychiatric: His behavior is normal. His mood appears not anxious. He exhibits a depressed mood. He expresses no homicidal and no suicidal ideation.  Admits to being intoxicated and huffing.     ED Course  Procedures (including critical care time)  DIAGNOSTIC STUDIES: Oxygen Saturation is 92% on room air, low by my interpretation.    COORDINATION OF CARE: 10:22 PM Discussed treatment plan with pt at  bedside and pt agreed to plan.    Labs Reviewed  COMPREHENSIVE METABOLIC PANEL  CBC WITH DIFFERENTIAL  URINE RAPID DRUG SCREEN (HOSP PERFORMED)  ETHANOL   No results found.   1. Alcohol intoxication   2. Alcohol dependence   3. Suicidal ideation   4. Huffing       MDM  Alcohol withdrawal protocol ordered. Holding orders placed. Home med rec completed ACT consulted.  IVC papers pending by patients mother and father.  I personally performed the services described in this documentation, which was scribed in my presence. The recorded information has been reviewed and is accurate.  Dorthula Matas, PA-C 11/08/12 2224

## 2012-11-08 NOTE — ED Provider Notes (Signed)
Medical screening examination/treatment/procedure(s) were performed by non-physician practitioner and as supervising physician I was immediately available for consultation/collaboration.    Celene Kras, MD 11/08/12 2229

## 2012-11-08 NOTE — ED Notes (Signed)
Pt states he is dying from hepatitis and cancer and feels like drinking and huffing; denies suicidal ideations

## 2012-11-08 NOTE — ED Notes (Signed)
Pt brought in by police; per police pt found huffing computer duster; mother called police; pt calm and cooperative at time of arrival; voiced suicidal ideations to gpd

## 2012-11-08 NOTE — ED Notes (Signed)
Pt wanded by security; placed in scrubs

## 2012-11-09 NOTE — ED Notes (Signed)
Tele pysch in progress 
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
## 2012-11-09 NOTE — ED Notes (Signed)
Patient denies SI and HI at this time.  

## 2012-11-09 NOTE — ED Provider Notes (Signed)
Pt has been seen by tele-psychiatrist, Dr Jacky Kindle, who has reversed the IVC paperwork and is recommending d/c home with f/u with local mental health.  Olivia Mackie, MD 11/09/12 [phone]

## 2012-12-06 ENCOUNTER — Encounter (HOSPITAL_COMMUNITY): Payer: Self-pay | Admitting: Emergency Medicine

## 2012-12-06 ENCOUNTER — Emergency Department (HOSPITAL_COMMUNITY)
Admission: EM | Admit: 2012-12-06 | Discharge: 2012-12-07 | Disposition: A | Discharge status: Home / Self Care | Payer: Self-pay | Attending: Emergency Medicine | Admitting: Emergency Medicine

## 2012-12-06 DIAGNOSIS — Z8619 Personal history of other infectious and parasitic diseases: Secondary | ICD-10-CM | POA: Insufficient documentation

## 2012-12-06 DIAGNOSIS — F319 Bipolar disorder, unspecified: Secondary | ICD-10-CM | POA: Insufficient documentation

## 2012-12-06 DIAGNOSIS — F411 Generalized anxiety disorder: Secondary | ICD-10-CM | POA: Insufficient documentation

## 2012-12-06 DIAGNOSIS — F1910 Other psychoactive substance abuse, uncomplicated: Secondary | ICD-10-CM | POA: Insufficient documentation

## 2012-12-06 DIAGNOSIS — Z859 Personal history of malignant neoplasm, unspecified: Secondary | ICD-10-CM | POA: Insufficient documentation

## 2012-12-06 DIAGNOSIS — R45851 Suicidal ideations: Secondary | ICD-10-CM | POA: Insufficient documentation

## 2012-12-06 DIAGNOSIS — F17200 Nicotine dependence, unspecified, uncomplicated: Secondary | ICD-10-CM | POA: Insufficient documentation

## 2012-12-06 DIAGNOSIS — F4310 Post-traumatic stress disorder, unspecified: Secondary | ICD-10-CM | POA: Insufficient documentation

## 2012-12-06 DIAGNOSIS — Z79899 Other long term (current) drug therapy: Secondary | ICD-10-CM | POA: Insufficient documentation

## 2012-12-06 DIAGNOSIS — F209 Schizophrenia, unspecified: Secondary | ICD-10-CM | POA: Insufficient documentation

## 2012-12-06 LAB — CBC WITH DIFFERENTIAL/PLATELET
Basophils Absolute: 0 10*3/uL (ref 0.0–0.1)
Eosinophils Absolute: 0.2 10*3/uL (ref 0.0–0.7)
Eosinophils Relative: 2 % (ref 0–5)
HCT: 42.5 % (ref 39.0–52.0)
Lymphocytes Relative: 42 % (ref 12–46)
Lymphs Abs: 3.1 10*3/uL (ref 0.7–4.0)
MCH: 31.5 pg (ref 26.0–34.0)
MCV: 86.4 fL (ref 78.0–100.0)
Monocytes Absolute: 0.6 10*3/uL (ref 0.1–1.0)
RDW: 11.5 % (ref 11.5–15.5)
WBC: 7.5 10*3/uL (ref 4.0–10.5)

## 2012-12-06 LAB — BASIC METABOLIC PANEL
CO2: 28 mEq/L (ref 19–32)
Calcium: 9.5 mg/dL (ref 8.4–10.5)
Creatinine, Ser: 1.18 mg/dL (ref 0.50–1.35)
Glucose, Bld: 97 mg/dL (ref 70–99)

## 2012-12-06 LAB — RAPID URINE DRUG SCREEN, HOSP PERFORMED
Barbiturates: NOT DETECTED
Tetrahydrocannabinol: NOT DETECTED

## 2012-12-06 MED ORDER — TRAZODONE HCL 100 MG PO TABS: 100.0000 mg | ORAL_TABLET | Freq: Every day | ORAL | Status: DC

## 2012-12-06 MED ORDER — SERTRALINE HCL 50 MG PO TABS: 25.0000 mg | ORAL_TABLET | Freq: Every morning | ORAL | Status: DC

## 2012-12-06 MED ORDER — IBUPROFEN 600 MG PO TABS: 600.0000 mg | ORAL_TABLET | Freq: Three times a day (TID) | ORAL | Status: DC | PRN

## 2012-12-06 MED ORDER — NICOTINE 21 MG/24HR TD PT24: 21.0000 mg | MEDICATED_PATCH | Freq: Every day | TRANSDERMAL | Status: DC

## 2012-12-06 MED ORDER — DIVALPROEX SODIUM ER 500 MG PO TB24
1500.0000 mg | ORAL_TABLET | Freq: Every day | ORAL | Status: DC
  Administered 2012-12-06: 1500 mg via ORAL
  Filled 2012-12-06 (×3): qty 3

## 2012-12-06 MED ORDER — CLONIDINE HCL 0.1 MG PO TABS
0.1000 mg | ORAL_TABLET | Freq: Two times a day (BID) | ORAL | Status: DC
  Administered 2012-12-06: 0.1 mg via ORAL
  Filled 2012-12-06: qty 1

## 2012-12-06 MED ORDER — FLUOXETINE HCL 20 MG PO CAPS
20.0000 mg | ORAL_CAPSULE | Freq: Every morning | ORAL | Status: DC
  Filled 2012-12-06: qty 1

## 2012-12-06 MED ORDER — ONDANSETRON HCL 4 MG PO TABS: 4.0000 mg | ORAL_TABLET | Freq: Three times a day (TID) | ORAL | Status: DC | PRN

## 2012-12-06 MED ORDER — LORAZEPAM 1 MG PO TABS: 1.0000 mg | ORAL_TABLET | Freq: Three times a day (TID) | ORAL | Status: DC | PRN

## 2012-12-06 MED ORDER — LURASIDONE HCL 80 MG PO TABS
80.0000 mg | ORAL_TABLET | Freq: Every morning | ORAL | Status: DC
  Filled 2012-12-06: qty 1

## 2012-12-06 MED ORDER — ACETAMINOPHEN 325 MG PO TABS: 650.0000 mg | ORAL_TABLET | ORAL | Status: DC | PRN

## 2012-12-06 MED ORDER — ZOLPIDEM TARTRATE 5 MG PO TABS: 5.0000 mg | ORAL_TABLET | Freq: Every evening | ORAL | Status: DC | PRN

## 2012-12-06 MED ORDER — HYDROXYZINE PAMOATE 25 MG PO CAPS
25.0000 mg | ORAL_CAPSULE | Freq: Four times a day (QID) | ORAL | Status: DC | PRN
  Filled 2012-12-06: qty 1

## 2012-12-06 NOTE — ED Notes (Signed)
Pt brought in by [employer] under IVC pt states he is here because he was "huffing air"  Pt states his mother called the police and now he is here   Pt denies SI/HI  Pt denies drug use

## 2012-12-06 NOTE — ED Provider Notes (Signed)
History    This chart was scribed for non-physician practitioner working with Hurman Horn, MD by Smitty Pluck, ED scribe. This patient was seen in room [facility] and the patient's care was started at 7:23 PM.   CSN: [id number]  Arrival date & time 12/06/12  1922     Chief Complaint  Patient presents with  . Medical Clearance    The history is provided by the patient. No language interpreter was used.   HPI Comments: Brian [name] is a 27 y.o. male with Hepatitis C and bipolar disorder who was brought into the Emergency Department by [facility] PD after being found attempting to huff paint cleaner. Pt's mother took out IVC papers on the pt due to his SI. Pt denies depression, HI, SI, hallucinations, self-injury, confusion,  fever, chills, nausea, vomiting, diarrhea, weakness, cough, SOB and any other pain. Pt is a marijuana user, current everyday smoker and occasional alcohol user.  Past Medical History  Diagnosis Date  . PTSD (post-traumatic stress disorder)   . Anxiety   . Depression   . Bipolar 1 disorder   . Schizophrenia   . Hepatitis   . Cancer     No past surgical history on file.  No family history on file.  History  Substance Use Topics  . Smoking status: Current Every Day Smoker    Types: Cigarettes  . Smokeless tobacco: Not on file  . Alcohol Use: No      Review of Systems  Constitutional: Negative for fever and chills.  Respiratory: Negative for shortness of breath.   Gastrointestinal: Negative for nausea and vomiting.  Neurological: Negative for weakness.  Psychiatric/Behavioral: Negative for suicidal ideas, hallucinations, confusion and self-injury.    Allergies  Phenytoin sodium extended; Pineapple; and Risperdal  Home Medications   Current Outpatient Rx  Name  Route  Sig  Dispense  Refill  . cloNIDine (CATAPRES) 0.1 MG tablet   Oral   Take 0.1 mg by mouth 2 (two) times daily.         . divalproex (DEPAKOTE ER) 500 MG 24 hr tablet  Oral   Take 1,500 mg by mouth at bedtime.         Marland Kitchen FLUoxetine (PROZAC) 20 MG capsule   Oral   Take 20 mg by mouth every morning.         . hydrOXYzine (VISTARIL) 25 MG capsule   Oral   Take 25-100 mg by mouth 2 (two) times daily as needed for anxiety. For anxiety         . ibuprofen (ADVIL,MOTRIN) 600 MG tablet   Oral   Take 600 mg by mouth every 6 (six) hours as needed for pain.         Marland Kitchen lurasidone (LATUDA) 80 MG TABS   Oral   Take 80 mg by mouth every morning.          Marland Kitchen PRESCRIPTION MEDICATION   Oral   Take 1 tablet by mouth daily. Antibiotic given after dental procedure         . sertraline (ZOLOFT) 25 MG tablet   Oral   Take 25 mg by mouth every morning.         . traZODone (DESYREL) 100 MG tablet   Oral   Take 100 mg by mouth at bedtime.           BP 117/78  Pulse 82  Temp(Src) 98.3 F (36.8 C) (Oral)  Resp 18  SpO2 98%  Physical Exam  Nursing  note and vitals reviewed. Constitutional: He is oriented to person, place, and time. He appears well-developed and well-nourished. No distress.  Avoiding eye contact. Not very talkative.  HENT:  Head: Normocephalic and atraumatic.  Eyes: EOM are normal.  Neck: Neck supple. No tracheal deviation present.  Cardiovascular: Normal rate, regular rhythm and normal heart sounds.  Exam reveals no gallop.   No murmur heard. Pulmonary/Chest: Effort normal and breath sounds normal. No respiratory distress. He has no wheezes. He has no rales.  Musculoskeletal: Normal range of motion.  Neurological: He is alert and oriented to person, place, and time.  Skin: Skin is warm and dry.  Psychiatric: He has a normal mood and affect. His behavior is normal.    ED Course  Procedures (including critical care time) DIAGNOSTIC STUDIES: Oxygen Saturation is 98% on room air, normal by my interpretation.    COORDINATION OF CARE: 7:35 PM: Discussed ED treatment with pt and pt agrees.    Results for orders placed  during the hospital encounter of 12/06/12  CBC WITH DIFFERENTIAL      Result Value Range   WBC 7.5  4.0 - 10.5 K/uL   RBC 4.92  4.22 - 5.81 MIL/uL   Hemoglobin 15.5  13.0 - 17.0 g/dL   HCT 16.1  09.6 - 04.5 %   MCV 86.4  78.0 - 100.0 fL   MCH 31.5  26.0 - 34.0 pg   MCHC 36.5 (*) 30.0 - 36.0 g/dL   RDW 40.9  81.1 - 91.4 %   Platelets 227  150 - 400 K/uL   Neutrophils Relative 47  43 - 77 %   Neutro Abs 3.5  1.7 - 7.7 K/uL   Lymphocytes Relative 42  12 - 46 %   Lymphs Abs 3.1  0.7 - 4.0 K/uL   Monocytes Relative 8  3 - 12 %   Monocytes Absolute 0.6  0.1 - 1.0 K/uL   Eosinophils Relative 2  0 - 5 %   Eosinophils Absolute 0.2  0.0 - 0.7 K/uL   Basophils Relative 0  0 - 1 %   Basophils Absolute 0.0  0.0 - 0.1 K/uL  BASIC METABOLIC PANEL      Result Value Range   Sodium 136  135 - 145 mEq/L   Potassium 4.1  3.5 - 5.1 mEq/L   Chloride 101  96 - 112 mEq/L   CO2 28  19 - 32 mEq/L   Glucose, Bld 97  70 - 99 mg/dL   BUN 13  6 - 23 mg/dL   Creatinine, Ser 7.82  0.50 - 1.35 mg/dL   Calcium 9.5  8.4 - 95.6 mg/dL   GFR calc non Af Amer 83 (*) >90 mL/min   GFR calc Af Amer >90  >90 mL/min  URINE RAPID DRUG SCREEN (HOSP PERFORMED)      Result Value Range   Opiates NONE DETECTED  NONE DETECTED   Cocaine NONE DETECTED  NONE DETECTED   Benzodiazepines POSITIVE (*) NONE DETECTED   Amphetamines NONE DETECTED  NONE DETECTED   Tetrahydrocannabinol NONE DETECTED  NONE DETECTED   Barbiturates NONE DETECTED  NONE DETECTED  ETHANOL      Result Value Range   Alcohol, Ethyl (B) <11  0 - 11 mg/dL   No results found.   Labs Reviewed - No data to display No results found.   1. Substance abuse       MDM  Pt with air huffing, IVCed by his mother. Denies SI  or HI, however IVC papers explain that he may be a danger to himself due to suicidal statements. Pt states he is homeless and not sure why his mother took papers out on him. Will get telepsych. Will monitor.     I personally performed  the services described in this documentation, which was scribed in my presence. The recorded information has been reviewed and is accurate.    Lottie Mussel, PA-C 12/07/12 0110

## 2012-12-06 NOTE — ED Notes (Signed)
IVC paperwork states the pt has been huffing in combination with trazadone, clonidine, latutia, depacote, marijuana, and alcohol  Paperwork also states pt does not want ot live anymore but does not have a plan to carry out a suicide  Pt denies suicidal thoughts at this time

## 2012-12-07 NOTE — ED Notes (Signed)
Dr. Bednar at bedside. 

## 2012-12-07 NOTE — ED Notes (Signed)
Patient denies SI and HI at discharge 

## 2012-12-07 NOTE — ED Provider Notes (Signed)
Medical screening examination/treatment/procedure(s) were conducted as a shared visit with non-physician practitioner(s) and myself.  I personally evaluated the patient during the encounter.  This 27 year old male was brought in as an involuntary commitment evaluation, he denies suicidal or homicidal ideation or hallucinations, his involuntary commitment was rescinded by tele-psychiatry, the patient has a history of substance abuse and was brought in due to his drug use, he states he is homeless but ED nursing staff know the patient and state that he actually stays with his mother, the patient appears stable to follow up with outpatient behavioral health resources. Patient informed of clinical course, understand medical decision-making process, and agree with plan.  Hurman Horn, MD 12/08/12 2018

## 2012-12-11 ENCOUNTER — Emergency Department (HOSPITAL_COMMUNITY)
Admission: EM | Admit: 2012-12-11 | Discharge: 2012-12-12 | Disposition: A | Discharge status: Home / Self Care | Payer: Self-pay | Attending: Emergency Medicine | Admitting: Emergency Medicine

## 2012-12-11 ENCOUNTER — Encounter (HOSPITAL_COMMUNITY): Payer: Self-pay

## 2012-12-11 DIAGNOSIS — F1010 Alcohol abuse, uncomplicated: Secondary | ICD-10-CM | POA: Insufficient documentation

## 2012-12-11 DIAGNOSIS — F319 Bipolar disorder, unspecified: Secondary | ICD-10-CM | POA: Insufficient documentation

## 2012-12-11 DIAGNOSIS — F2089 Other schizophrenia: Secondary | ICD-10-CM | POA: Insufficient documentation

## 2012-12-11 DIAGNOSIS — F1910 Other psychoactive substance abuse, uncomplicated: Secondary | ICD-10-CM

## 2012-12-11 DIAGNOSIS — Z8719 Personal history of other diseases of the digestive system: Secondary | ICD-10-CM | POA: Insufficient documentation

## 2012-12-11 DIAGNOSIS — F1110 Opioid abuse, uncomplicated: Secondary | ICD-10-CM | POA: Insufficient documentation

## 2012-12-11 DIAGNOSIS — Z79899 Other long term (current) drug therapy: Secondary | ICD-10-CM | POA: Insufficient documentation

## 2012-12-11 DIAGNOSIS — F411 Generalized anxiety disorder: Secondary | ICD-10-CM | POA: Insufficient documentation

## 2012-12-11 DIAGNOSIS — Z8589 Personal history of malignant neoplasm of other organs and systems: Secondary | ICD-10-CM | POA: Insufficient documentation

## 2012-12-11 DIAGNOSIS — F1210 Cannabis abuse, uncomplicated: Secondary | ICD-10-CM | POA: Insufficient documentation

## 2012-12-11 DIAGNOSIS — Z8659 Personal history of other mental and behavioral disorders: Secondary | ICD-10-CM | POA: Insufficient documentation

## 2012-12-11 LAB — CBC
HCT: 42.2 % (ref 39.0–52.0)
Hemoglobin: 15.6 g/dL (ref 13.0–17.0)
MCH: 30.8 pg (ref 26.0–34.0)
MCHC: 37 g/dL — ABNORMAL HIGH (ref 30.0–36.0)
MCV: 83.2 fL (ref 78.0–100.0)

## 2012-12-11 LAB — RAPID URINE DRUG SCREEN, HOSP PERFORMED
Barbiturates: NOT DETECTED
Cocaine: NOT DETECTED
Opiates: NOT DETECTED

## 2012-12-11 LAB — COMPREHENSIVE METABOLIC PANEL
BUN: 15 mg/dL (ref 6–23)
CO2: 22 mEq/L (ref 19–32)
Calcium: 9.6 mg/dL (ref 8.4–10.5)
GFR calc Af Amer: >90 mL/min (ref >90)
GFR calc non Af Amer: >90 mL/min (ref >90)
Glucose, Bld: 81 mg/dL (ref 70–99)
Total Protein: 7.4 g/dL (ref 6.0–8.3)

## 2012-12-11 LAB — ETHANOL: Alcohol, Ethyl (B): <11 mg/dL (ref 0–11)

## 2012-12-11 MED ORDER — HYDROXYZINE PAMOATE 25 MG PO CAPS
25.0000 mg | ORAL_CAPSULE | Freq: Four times a day (QID) | ORAL | Status: DC | PRN
  Filled 2012-12-11: qty 1

## 2012-12-11 MED ORDER — NICOTINE 21 MG/24HR TD PT24
21.0000 mg | MEDICATED_PATCH | Freq: Every day | TRANSDERMAL | Status: DC
  Administered 2012-12-12: 21 mg via TRANSDERMAL
  Filled 2012-12-11: qty 1

## 2012-12-11 MED ORDER — DIVALPROEX SODIUM ER 500 MG PO TB24
1500.0000 mg | ORAL_TABLET | Freq: Every day | ORAL | Status: DC
  Administered 2012-12-12: 1500 mg via ORAL
  Filled 2012-12-11 (×2): qty 3

## 2012-12-11 MED ORDER — FLUOXETINE HCL 20 MG PO CAPS
20.0000 mg | ORAL_CAPSULE | Freq: Every morning | ORAL | Status: DC
  Administered 2012-12-12: 20 mg via ORAL
  Filled 2012-12-11: qty 1

## 2012-12-11 MED ORDER — TRAZODONE HCL 50 MG PO TABS
100.0000 mg | ORAL_TABLET | Freq: Every day | ORAL | Status: DC
  Administered 2012-12-12: 100 mg via ORAL
  Filled 2012-12-11: qty 2

## 2012-12-11 MED ORDER — LURASIDONE HCL 80 MG PO TABS
80.0000 mg | ORAL_TABLET | Freq: Every day | ORAL | Status: DC
  Filled 2012-12-11: qty 1

## 2012-12-11 MED ORDER — ACETAMINOPHEN 325 MG PO TABS
650.0000 mg | ORAL_TABLET | ORAL | Status: DC | PRN
  Administered 2012-12-11: 650 mg via ORAL
  Filled 2012-12-11: qty 2

## 2012-12-11 MED ORDER — IBUPROFEN 400 MG PO TABS
600.0000 mg | ORAL_TABLET | Freq: Three times a day (TID) | ORAL | Status: DC | PRN
  Administered 2012-12-11: 600 mg via ORAL
  Filled 2012-12-11: qty 1

## 2012-12-11 MED ORDER — CLONIDINE HCL 0.1 MG PO TABS
0.1000 mg | ORAL_TABLET | Freq: Two times a day (BID) | ORAL | Status: DC
  Administered 2012-12-12 (×2): 0.1 mg via ORAL
  Filled 2012-12-11 (×2): qty 1

## 2012-12-11 MED ORDER — LORAZEPAM 1 MG PO TABS
1.0000 mg | ORAL_TABLET | Freq: Three times a day (TID) | ORAL | Status: DC | PRN
  Administered 2012-12-11: 1 mg via ORAL
  Filled 2012-12-11: qty 2

## 2012-12-11 MED ORDER — ONDANSETRON HCL 8 MG PO TABS
4.0000 mg | ORAL_TABLET | Freq: Three times a day (TID) | ORAL | Status: DC | PRN
Start: 2012-12-11 — End: 2012-12-12
  Administered 2012-12-12: 4 mg via ORAL
  Filled 2012-12-11: qty 1

## 2012-12-11 MED ORDER — ZOLPIDEM TARTRATE 5 MG PO TABS
5.0000 mg | ORAL_TABLET | Freq: Every evening | ORAL | Status: DC | PRN
  Administered 2012-12-12: 5 mg via ORAL
  Filled 2012-12-11: qty 1

## 2012-12-11 MED ORDER — SERTRALINE HCL 50 MG PO TABS
25.0000 mg | ORAL_TABLET | Freq: Every morning | ORAL | Status: DC
  Administered 2012-12-12: 25 mg via ORAL
  Filled 2012-12-11: qty 1

## 2012-12-11 NOTE — ED Provider Notes (Signed)
Medical screening examination/treatment/procedure(s) were performed by non-physician practitioner and as supervising physician I was immediately available for consultation/collaboration.  [name], MD 12/11/12 [phone]

## 2012-12-11 NOTE — ED Notes (Signed)
Patient requests detox from ETOH and illicit drugs in order to go into a rehabilitation program. Admits to using marijuana, methamphetamine, heroin and  "huffing" air canisters. Last use today. Patient has stopped taking prescribed medications 1 week ago because he is "depressed about life." Lives with mother and hasn't had a job in over 2 years. Denies homicidal or suicidial ideations; states "I'm not that depressed"

## 2012-12-11 NOTE — ED Provider Notes (Signed)
Medical screening examination/treatment/procedure(s) were performed by non-physician practitioner and as supervising physician I was immediately available for consultation/collaboration.  [name], MD 12/11/12 2340

## 2012-12-11 NOTE — ED Provider Notes (Addendum)
History    This chart was scribed for non-physician practitioner working with Brian [name], * by [name], ED Scribe. This patient was seen in room TR08C/TR08C and the patient's care was started at 2115.   CSN: [id number]  Arrival date & time 12/11/12  7829   First MD Initiated Contact with Patient 12/11/12 2115      Chief Complaint  Patient presents with  . Medical Clearance     The history is provided by the patient. No language interpreter was used.   Brian [name] is a 27 y.o. male who presents to the Emergency Department complaining of a desire to detox from alcohol and illicit drugs. He states he currently uses marijuana, methamphetamine, heroin and "huffing" air canisters. He reports he last used alcohol (five 16 oz beers) and inhaling air ("all day") today, and he reports he drinks alcohol daily. He stopped taking his prescription medication because he is "depressed about life." He states that he sometimes sleep in the woods. He lives with his mother and hasn't had a job in over 2 years. He denies HI or SI. He was seen at [facility] ED on 12/06/12 when he was IVC'd by his mother and released by the tele-psychiatrist. He states that he is prepared to quit because he is tired of spending the money on it, of looking like a fool, and of letting his mother down.  His last bowel movement was yesterday (normal). Admits nausea, vomiting. Denies fever, recent illness, visual disturbances, headaches, numbness, tingling.   He has a h/o PTSD, anxiety, depression, bipolar 1 disorder, schizophrenia, hepatitis and cancer.  Past Medical History  Diagnosis Date  . PTSD (post-traumatic stress disorder)   . Anxiety   . Depression   . Bipolar 1 disorder   . Schizophrenia   . Hepatitis   . Cancer     Past Surgical History  Procedure Laterality Date  . Leg surgery      No family history on file.  History  Substance Use Topics  . Smoking status: Current Every Day Smoker --  1.00 packs/day    Types: Cigarettes  . Smokeless tobacco: Never Used  . Alcohol Use: Yes     Comment: 12-18 packs of beer/day      Review of Systems  Constitutional: Negative for fever and diaphoresis.  HENT: Negative for neck pain and neck stiffness.   Eyes: Negative for visual disturbance.  Respiratory: Negative for apnea, chest tightness and shortness of breath.   Cardiovascular: Negative for chest pain and palpitations.  Gastrointestinal: Negative for nausea, vomiting, diarrhea and constipation.  Genitourinary: Negative for dysuria.  Musculoskeletal: Negative for gait problem.  Skin: Negative for rash.  Neurological: Negative for dizziness, weakness, light-headedness, numbness and headaches.    Allergies  Phenytoin sodium extended; Pineapple; and Risperdal  Home Medications   Current Outpatient Rx  Name  Route  Sig  Dispense  Refill  . cloNIDine (CATAPRES) 0.1 MG tablet   Oral   Take 0.1 mg by mouth 2 (two) times daily.         . divalproex (DEPAKOTE ER) 500 MG 24 hr tablet   Oral   Take 1,500 mg by mouth at bedtime.         Marland Kitchen FLUoxetine (PROZAC) 20 MG capsule   Oral   Take 20 mg by mouth every morning.         . hydrOXYzine (VISTARIL) 25 MG capsule   Oral   Take 25 mg by mouth 4 (  four) times daily as needed for anxiety. For anxiety         . ibuprofen (ADVIL,MOTRIN) 600 MG tablet   Oral   Take 600 mg by mouth every 6 (six) hours as needed for pain.         Marland Kitchen lurasidone (LATUDA) 80 MG TABS   Oral   Take 80 mg by mouth at bedtime.          . sertraline (ZOLOFT) 25 MG tablet   Oral   Take 25 mg by mouth every morning.         . traZODone (DESYREL) 100 MG tablet   Oral   Take 100 mg by mouth at bedtime.           BP 138/95  Pulse 98  Temp(Src) 98.4 F (36.9 C) (Oral)  Resp 17  SpO2 96%  Physical Exam  Nursing note and vitals reviewed. Constitutional: He is oriented to person, place, and time. He appears well-developed and  well-nourished. No distress.  HENT:  Head: Normocephalic and atraumatic.  Eyes: Conjunctivae and EOM are normal.  Neck: Normal range of motion. Neck supple.  No meningeal signs  Cardiovascular: Normal rate, regular rhythm and normal heart sounds.  Exam reveals no gallop and no friction rub.   No murmur heard. Pulmonary/Chest: Effort normal and breath sounds normal. No respiratory distress. He has no wheezes. He has no rales. He exhibits no tenderness.  Abdominal: Soft. He exhibits no distension. There is no tenderness. There is no rebound and no guarding.  Musculoskeletal: Normal range of motion. He exhibits no edema and no tenderness.  5/5 strength throughout  Neurological: He is alert and oriented to person, place, and time. No cranial nerve deficit.  No focal deficits  Skin: Skin is warm and dry. He is not diaphoretic. No erythema.  Psychiatric: He has a normal mood and affect.    ED Course  Procedures (including critical care time) DIAGNOSTIC STUDIES: Oxygen Saturation is 96% on room air, adequate by my interpretation.    COORDINATION OF CARE: 9:24 PMDiscussed treatment plan which includes ACT consult and outpt tx with pt at bedside and pt agreed to plan.     Labs Reviewed  CBC - Abnormal; Notable for the following:    MCHC 37.0 (*)    All other components within normal limits  COMPREHENSIVE METABOLIC PANEL - Abnormal; Notable for the following:    Sodium 133 (*)    AST 59 (*)    All other components within normal limits  SALICYLATE LEVEL - Abnormal; Notable for the following:    Salicylate Lvl <2.0 (*)    All other components within normal limits  ACETAMINOPHEN LEVEL  ETHANOL  URINE RAPID DRUG SCREEN (HOSP PERFORMED)   No results found.   1. Substance abuse       MDM  Pt presents to the ED for medical clearance.  Pt is not currently having SI or HI ideations. The patient's demeanor is dysphoric. Admits difficulty sleeping, feelings of worthlessness, lack of  energy, difficulty concentrating, loss of appetite, feelings of anxiety.  The patient currently does not have any acute physical complaints and is in no acute distress. The patient was brought to ED by self with mother at bedside. Interested in outpt resources available for polysubstance abuse. ACT consult was appreciated, resource material provided, and pt decided to seek outpt treatment at this time.    I personally performed the services described in this documentation, which was scribed in my  presence. The recorded information has been reviewed and is accurate.          Glade Nurse, PA-C 12/11/12 2332  ADDENDUM: Note: On discharge, as per nurse, pt changed his mind and wants to spend the night at hospital - another option that was discussed with ACT consultant. Discharge was cancelled and pt will be moved to Pod C for further evaluation. A check of vital signs prior to discharge show an elevated BP of 150/107 to be observed as well.   Glade Nurse, PA-C 12/11/12 2339
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
## 2012-12-12 NOTE — BH Assessment (Addendum)
Assessment Note  Update:  Update:  Per Cathy at [facility], they cannot take pt because he was just discharged from here and it its too soon for him to return @ 1645.  [facility] has no detox beds per Lauren @ 1515.  Discussed case with EDP Jeraldine Loots, who assessed pt and determined pt would be appropriate for discharge with SA outpatient referrals.  Pt denies SI/HI or psychosis.  Although SA outpatient referrals were already given to pt by previous clinician, they were again offered to pt by this writer.  Pt stated he will follow up with [facility] tomorrow.  Pt to be discharged and was given a bus pass by ED staff.      Disposition:   Pt given SA outpatient referrals and to follow up with [facility] tomorrow morning.  On Site Evaluation by:   Reviewed with Physician:  Lodema Pilot, Rennis Harding 12/12/2012 6:03 PM

## 2012-12-12 NOTE — BH Assessment (Signed)
[facility] Assessment Progress Note   Clinician was requested by PA Glade Nurse to talk with patient to see if he just needed referral information or maybe admission to a detox facility.  Mother was in the room also with patient's permission.  Patient was tremulous when seen by this clinician.  Patient requested detox so that he could get into a tx facility for rehabilitation.  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  Patient reports drinking 4 beers over the course of the day today although his BAL is <11.  Patient vacillated between staying or leaving.  He decided to stay for now.  His mother was given the referral information for rehab facilities in this area.  Patient does not appear to be a danger to himself outside of his SA problems and may leave, if medically advisable, were he to change mind about staying.

## 2012-12-12 NOTE — ED Notes (Addendum)
Per EMT, Maggie in pharmacy advised that EMT's could not bring RX's down.   I called Maggie and advised that Elliot Gurney, Consulting civil engineer had called down and spoke with Vernona Rieger and gotten okay for EMT to bring down inventoried medications.   Advised charge of information.

## 2012-12-12 NOTE — BH Assessment (Signed)
[facility] Assessment Progress Note      Consulted Nanine Means NP re admission for the [facility] patient to come to [facility]. He was decline due to no criteria, his ETOH level was less than 11 and his UDS was negative. He is seeking rehab for substance use and can be given referrals from the ED for that service.

## 2012-12-12 NOTE — ED Notes (Signed)
Pt given graham crackers and water.

## 2012-12-12 NOTE — ED Notes (Signed)
Pt given bus pass ?
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
## 2012-12-12 NOTE — ED Notes (Signed)
Pt sleeping, remains cooperative with care.

## 2012-12-12 NOTE — ED Provider Notes (Signed)
5:40 PM Patient sitting upright in bed, eating dinner. We discussed results, recommendations per behavioral health. The patient is in no distress, with no suicidal or homicidal ideation. His request for assistance with substance abuse, was accommodated with provision of outpatient resources. The patient has stable vital signs, and was discharged in stable condition after provision of said resources.  Gerhard Munch, MD 12/12/12 [phone]

## 2012-12-29 ENCOUNTER — Emergency Department (HOSPITAL_COMMUNITY)
Admission: EM | Admit: 2012-12-29 | Discharge: 2012-12-29 | Disposition: A | Discharge status: Home / Self Care | Payer: Self-pay | Attending: Emergency Medicine | Admitting: Emergency Medicine

## 2012-12-29 DIAGNOSIS — K0889 Other specified disorders of teeth and supporting structures: Secondary | ICD-10-CM

## 2012-12-29 DIAGNOSIS — C801 Malignant (primary) neoplasm, unspecified: Secondary | ICD-10-CM | POA: Insufficient documentation

## 2012-12-29 DIAGNOSIS — F17200 Nicotine dependence, unspecified, uncomplicated: Secondary | ICD-10-CM | POA: Insufficient documentation

## 2012-12-29 DIAGNOSIS — K759 Inflammatory liver disease, unspecified: Secondary | ICD-10-CM | POA: Insufficient documentation

## 2012-12-29 DIAGNOSIS — Z79899 Other long term (current) drug therapy: Secondary | ICD-10-CM | POA: Insufficient documentation

## 2012-12-29 DIAGNOSIS — K0389 Other specified diseases of hard tissues of teeth: Secondary | ICD-10-CM | POA: Insufficient documentation

## 2012-12-29 DIAGNOSIS — F4310 Post-traumatic stress disorder, unspecified: Secondary | ICD-10-CM | POA: Insufficient documentation

## 2012-12-29 DIAGNOSIS — F411 Generalized anxiety disorder: Secondary | ICD-10-CM | POA: Insufficient documentation

## 2012-12-29 DIAGNOSIS — F329 Major depressive disorder, single episode, unspecified: Secondary | ICD-10-CM | POA: Insufficient documentation

## 2012-12-29 DIAGNOSIS — F3289 Other specified depressive episodes: Secondary | ICD-10-CM | POA: Insufficient documentation

## 2012-12-29 DIAGNOSIS — R6884 Jaw pain: Secondary | ICD-10-CM | POA: Insufficient documentation

## 2012-12-29 DIAGNOSIS — F209 Schizophrenia, unspecified: Secondary | ICD-10-CM | POA: Insufficient documentation

## 2012-12-29 DIAGNOSIS — F319 Bipolar disorder, unspecified: Secondary | ICD-10-CM | POA: Insufficient documentation

## 2012-12-29 DIAGNOSIS — K029 Dental caries, unspecified: Secondary | ICD-10-CM | POA: Insufficient documentation

## 2012-12-29 MED ORDER — PENICILLIN V POTASSIUM 500 MG PO TABS: 500.0000 mg | ORAL_TABLET | Freq: Four times a day (QID) | ORAL | Status: AC

## 2012-12-29 MED ORDER — TRAMADOL HCL 50 MG PO TABS: 50.0000 mg | ORAL_TABLET | Freq: Four times a day (QID) | ORAL | Status: DC | PRN

## 2012-12-29 NOTE — ED Notes (Signed)
Pt present with c/o jaw pain, "popping" pt reports rotten teeth in mouth

## 2012-12-29 NOTE — ED Provider Notes (Signed)
  Medical screening examination/treatment/procedure(s) were performed by non-physician practitioner and as supervising physician I was immediately available for consultation/collaboration.    Omair Johnella Crumm, MD 12/29/12 1906 

## 2012-12-29 NOTE — ED Provider Notes (Signed)
History    This chart was scribed for Brian Sinning, PA working with Brian Munch, MD by ED Scribe, Burman Nieves. This patient was seen in room WTR9/WTR9 and the patient's care was started at 5:37 PM.  CSN: [id number]  Arrival date & time 12/29/12  1715   First MD Initiated Contact with Patient 12/29/12 1737      No chief complaint on file.   (Consider location/radiation/quality/duration/timing/severity/associated sxs/prior treatment) Patient is a 27 y.o. male presenting with tooth pain. The history is provided by the patient. No language interpreter was used.  Dental PainThe primary symptoms include mouth pain. Primary symptoms do not include dental injury or fever. The symptoms began yesterday. The symptoms are worsening. The symptoms occur constantly.  Additional symptoms include: dental sensitivity to temperature, gum swelling and gum tenderness. Additional symptoms do not include: purulent gums, trismus, facial swelling, trouble swallowing and pain with swallowing.   HPI Comments: Brian [name] is a 27 y.o. male with h/o PTSD, depression, bipolar disorder, and schizophrenia who presents to the Emergency Department complaining of moderate constant tooth pain with associated "popping" of his jaw which has been bothering him persistently today. Pt states that he has a tooth that has gone bad.  Pt denies fever, chills, cough, nausea, vomiting, diarrhea, SOB, weakness, and any other associated symptoms. Pt states he went to an Urgent Care but did not have the funds to pay for his visit. Pt states that he is currently unemployed but states that his dad is going to help him out who currently lives in [place].  He does not have a dentist at this time.  Pt currently lives with his mother. Pt is a current tobacco smoker (1pack/day) and admits to drinking alcohol.    Past Medical History  Diagnosis Date  . PTSD (post-traumatic stress disorder)   . Anxiety   . Depression   . Bipolar 1  disorder   . Schizophrenia   . Hepatitis   . Cancer     Past Surgical History  Procedure Laterality Date  . Leg surgery      No family history on file.  History  Substance Use Topics  . Smoking status: Current Every Day Smoker -- 1.00 packs/day    Types: Cigarettes  . Smokeless tobacco: Never Used  . Alcohol Use: Yes     Comment: 12-18 packs of beer/day      Review of Systems  Constitutional: Negative for fever and chills.  HENT: Positive for dental problem. Negative for facial swelling, trouble swallowing, neck pain and neck stiffness.   All other systems reviewed and are negative.    Allergies  Phenytoin sodium extended; Pineapple; and Risperdal  Home Medications   Current Outpatient Rx  Name  Route  Sig  Dispense  Refill  . cloNIDine (CATAPRES) 0.1 MG tablet   Oral   Take 0.1 mg by mouth 2 (two) times daily.         Marland Kitchen FLUoxetine (PROZAC) 20 MG capsule   Oral   Take 20 mg by mouth every morning.         . hydrOXYzine (VISTARIL) 25 MG capsule   Oral   Take 25 mg by mouth 4 (four) times daily as needed for anxiety. For anxiety         . lurasidone (LATUDA) 80 MG TABS   Oral   Take 80 mg by mouth at bedtime.          . sertraline (ZOLOFT) 25 MG tablet  Oral   Take 25 mg by mouth every morning.         . traZODone (DESYREL) 100 MG tablet   Oral   Take 100 mg by mouth at bedtime.           BP 128/74  Pulse 83  Temp(Src) 98.3 F (36.8 C) (Oral)  Resp 15  SpO2 100%  Physical Exam  Nursing note and vitals reviewed. Constitutional: He appears well-developed and well-nourished.  HENT:  Head: Normocephalic and atraumatic.  Mouth/Throat: Oropharynx is clear and moist.  Right posterior molar tooth is decayed. No obvious abscess, but gingiva tender to palpation. No submandibular or submental adenopathy. No sublingual tenderness to palpation.  No tongue elevation.  Eyes: EOM are normal. Pupils are equal, round, and reactive to light.   Neck: Normal range of motion. Neck supple.  Cardiovascular: Normal rate, regular rhythm and normal heart sounds.   Pulmonary/Chest: Effort normal and breath sounds normal. He has no wheezes.  Musculoskeletal: Normal range of motion.  Neurological: He is alert.  Skin: Skin is warm and dry.  Psychiatric: He has a normal mood and affect. His behavior is normal.    ED Course  Procedures (including critical care time) DIAGNOSTIC STUDIES: Oxygen Saturation is 100% on room air, normal by my interpretation.    COORDINATION OF CARE:  6:03 PM Discussed ED treatment with pt and pt agrees.    Labs Reviewed - No data to display No results found.   No diagnosis found.    MDM  Patient with toothache.  No gross abscess.  Exam unconcerning for Ludwig's angina or spread of infection.  Will treat with penicillin and pain medicine.  Urged patient to follow-up with dentist.    I personally performed the services described in this documentation, which was scribed in my presence. The recorded information has been reviewed and is accurate.   Pascal Lux [place], PA-C 12/29/12 [phone]

## 2012-12-29 NOTE — ED Notes (Signed)
Pt present to ED with c/o jaw popping x 6 months and right lower tooth pain

## 2013-01-19 ENCOUNTER — Encounter (HOSPITAL_COMMUNITY): Payer: Self-pay | Admitting: *Deleted

## 2013-01-19 ENCOUNTER — Emergency Department (HOSPITAL_COMMUNITY)
Admission: EM | Admit: 2013-01-19 | Discharge: 2013-01-19 | Disposition: A | Discharge status: Home / Self Care | Payer: Self-pay | Attending: Emergency Medicine | Admitting: Emergency Medicine

## 2013-01-19 DIAGNOSIS — Y9389 Activity, other specified: Secondary | ICD-10-CM | POA: Insufficient documentation

## 2013-01-19 DIAGNOSIS — S39012A Strain of muscle, fascia and tendon of lower back, initial encounter: Secondary | ICD-10-CM

## 2013-01-19 DIAGNOSIS — F17200 Nicotine dependence, unspecified, uncomplicated: Secondary | ICD-10-CM | POA: Insufficient documentation

## 2013-01-19 DIAGNOSIS — F411 Generalized anxiety disorder: Secondary | ICD-10-CM | POA: Insufficient documentation

## 2013-01-19 DIAGNOSIS — Z8719 Personal history of other diseases of the digestive system: Secondary | ICD-10-CM | POA: Insufficient documentation

## 2013-01-19 DIAGNOSIS — Y9289 Other specified places as the place of occurrence of the external cause: Secondary | ICD-10-CM | POA: Insufficient documentation

## 2013-01-19 DIAGNOSIS — F209 Schizophrenia, unspecified: Secondary | ICD-10-CM | POA: Insufficient documentation

## 2013-01-19 DIAGNOSIS — F319 Bipolar disorder, unspecified: Secondary | ICD-10-CM | POA: Insufficient documentation

## 2013-01-19 DIAGNOSIS — S335XXA Sprain of ligaments of lumbar spine, initial encounter: Secondary | ICD-10-CM | POA: Insufficient documentation

## 2013-01-19 DIAGNOSIS — Z859 Personal history of malignant neoplasm, unspecified: Secondary | ICD-10-CM | POA: Insufficient documentation

## 2013-01-19 DIAGNOSIS — F4310 Post-traumatic stress disorder, unspecified: Secondary | ICD-10-CM | POA: Insufficient documentation

## 2013-01-19 DIAGNOSIS — X503XXA Overexertion from repetitive movements, initial encounter: Secondary | ICD-10-CM | POA: Insufficient documentation

## 2013-01-19 DIAGNOSIS — Z79899 Other long term (current) drug therapy: Secondary | ICD-10-CM | POA: Insufficient documentation

## 2013-01-19 MED ORDER — TRAMADOL HCL 50 MG PO TABS: 50.0000 mg | ORAL_TABLET | Freq: Four times a day (QID) | ORAL | Status: DC | PRN

## 2013-01-19 MED ORDER — IBUPROFEN 800 MG PO TABS: 800.0000 mg | ORAL_TABLET | Freq: Three times a day (TID) | ORAL | Status: DC

## 2013-01-19 MED ORDER — CYCLOBENZAPRINE HCL 10 MG PO TABS: 10.0000 mg | ORAL_TABLET | Freq: Two times a day (BID) | ORAL | Status: DC | PRN

## 2013-01-19 NOTE — ED Notes (Signed)
The pt injured his lower back yesterday lifting a log.  C/o more pain today

## 2013-01-19 NOTE — ED Notes (Signed)
Pt reports lifting a log yesterday am and injured his back. States that he has been taking ibuprofen for the pain with no relief. Reports lower back pain 8/10.

## 2013-01-19 NOTE — ED Provider Notes (Signed)
History  This chart was scribed for Brian Hooker, PA-C working with Dione Booze, MD by Ardelia Mems, ED Scribe. This patient was seen in room TR11C/TR11C and the patient's care was started at 7:54 PM.   CSN: [id number]  Arrival date & time 01/19/13  1903     Chief Complaint  Patient presents with  . Back Pain    The history is provided by the patient. No language interpreter was used.    HPI Comments: Brian [name] is a 27 y.o. male with a history of PTSD, hepatitis and cancer who presents to the Emergency Department complaining of constant, moderate lower back pain onset 36 hours ago. Pt states that he was lifting a log yesterday morning, using his back muscles, when he injured his lower back. Pt denies abdominal pain. 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   Past Medical History  Diagnosis Date  . PTSD (post-traumatic stress disorder)   . Anxiety   . Depression   . Bipolar 1 disorder   . Schizophrenia   . Hepatitis   . Cancer     Past Surgical History  Procedure Laterality Date  . Leg surgery      No family history on file.  History  Substance Use Topics  . Smoking status: Current Every Day Smoker -- 1.00 packs/day    Types: Cigarettes  . Smokeless tobacco: Never Used  . Alcohol Use: Yes     Comment: 12-18 packs of beer/day      Review of Systems  Allergies  Phenytoin sodium extended; Pineapple; and Risperdal  Home Medications   Current Outpatient Rx  Name  Route  Sig  Dispense  Refill  . cloNIDine (CATAPRES) 0.1 MG tablet   Oral   Take 0.1 mg by mouth 2 (two) times daily.         . divalproex (DEPAKOTE) 500 MG DR tablet   Oral   Take 1,500 mg by mouth at bedtime.         Marland Kitchen FLUoxetine (PROZAC) 20 MG capsule   Oral   Take 20 mg by mouth every morning.         . hydrOXYzine (VISTARIL) 25 MG capsule   Oral   Take 25 mg by mouth 4 (four) times daily as needed for anxiety. For anxiety         . lurasidone (LATUDA) 80 MG TABS    Oral   Take 80 mg by mouth at bedtime.          . sertraline (ZOLOFT) 25 MG tablet   Oral   Take 25 mg by mouth every morning.         . traZODone (DESYREL) 100 MG tablet   Oral   Take 100 mg by mouth at bedtime.           Triage Vitals: BP 124/67  Pulse 72  Temp(Src) 98.3 F (36.8 C) (Oral)  Resp 20  SpO2 96%  Physical Exam  Nursing note and vitals reviewed. Constitutional: He is oriented to person, place, and time. He appears well-developed and well-nourished.  HENT:  Head: Normocephalic and atraumatic.  Eyes: EOM are normal. Pupils are equal, round, and reactive to light.  Neck: Normal range of motion. No tracheal deviation present.  Cardiovascular: Normal rate.   Pulmonary/Chest: Effort normal. No respiratory distress.  Abdominal: Soft. There is no tenderness.  Musculoskeletal:  Left lateral lumbar tenderness.  Neurological: He is oriented to person, place, and time.  Skin: Skin is  warm. No rash noted.  Psychiatric: He has a normal mood and affect.    ED Course  Procedures (including critical care time)  DIAGNOSTIC STUDIES: Oxygen Saturation is 96% on RA, adequate by my interpretation.    COORDINATION OF CARE: 8:10 PM- Pt advised of plan for treatment and pt agrees.     Labs Reviewed - No data to display No results found.   No diagnosis found.  1. Low back strain  MDM  Findings consistent with muscular strain of low back after heavy lifting. Patient reports he is in recovery from addiction and does not want narcotic pain relievers. Mom at bedside and agrees.            I personally performed the services described in this documentation, which was scribed in my presence. The recorded information has been reviewed and is accurate.     Brian Hooker, PA-C 01/19/13 2027

## 2013-01-20 NOTE — ED Provider Notes (Signed)
Medical screening examination/treatment/procedure(s) were performed by non-physician practitioner and as supervising physician I was immediately available for consultation/collaboration.  Dione Booze, MD 01/20/13 0030

## 2013-03-01 ENCOUNTER — Encounter (HOSPITAL_COMMUNITY): Payer: Self-pay | Admitting: Emergency Medicine

## 2013-03-01 ENCOUNTER — Emergency Department (HOSPITAL_COMMUNITY): Payer: Self-pay

## 2013-03-01 ENCOUNTER — Emergency Department (HOSPITAL_COMMUNITY)
Admission: EM | Admit: 2013-03-01 | Discharge: 2013-03-02 | Disposition: A | Discharge status: Home / Self Care | Payer: Self-pay | Attending: Emergency Medicine | Admitting: Emergency Medicine

## 2013-03-01 DIAGNOSIS — F411 Generalized anxiety disorder: Secondary | ICD-10-CM | POA: Insufficient documentation

## 2013-03-01 DIAGNOSIS — R45851 Suicidal ideations: Secondary | ICD-10-CM | POA: Insufficient documentation

## 2013-03-01 DIAGNOSIS — F1910 Other psychoactive substance abuse, uncomplicated: Secondary | ICD-10-CM

## 2013-03-01 DIAGNOSIS — F329 Major depressive disorder, single episode, unspecified: Secondary | ICD-10-CM

## 2013-03-01 DIAGNOSIS — R05 Cough: Secondary | ICD-10-CM | POA: Insufficient documentation

## 2013-03-01 DIAGNOSIS — Z8659 Personal history of other mental and behavioral disorders: Secondary | ICD-10-CM | POA: Insufficient documentation

## 2013-03-01 DIAGNOSIS — F209 Schizophrenia, unspecified: Secondary | ICD-10-CM | POA: Insufficient documentation

## 2013-03-01 DIAGNOSIS — Z79899 Other long term (current) drug therapy: Secondary | ICD-10-CM | POA: Insufficient documentation

## 2013-03-01 DIAGNOSIS — Z8619 Personal history of other infectious and parasitic diseases: Secondary | ICD-10-CM | POA: Insufficient documentation

## 2013-03-01 DIAGNOSIS — R112 Nausea with vomiting, unspecified: Secondary | ICD-10-CM | POA: Insufficient documentation

## 2013-03-01 DIAGNOSIS — Z791 Long term (current) use of non-steroidal anti-inflammatories (NSAID): Secondary | ICD-10-CM | POA: Insufficient documentation

## 2013-03-01 DIAGNOSIS — F319 Bipolar disorder, unspecified: Secondary | ICD-10-CM | POA: Insufficient documentation

## 2013-03-01 DIAGNOSIS — F32A Depression, unspecified: Secondary | ICD-10-CM

## 2013-03-01 DIAGNOSIS — Z859 Personal history of malignant neoplasm, unspecified: Secondary | ICD-10-CM | POA: Insufficient documentation

## 2013-03-01 DIAGNOSIS — F1920 Other psychoactive substance dependence, uncomplicated: Secondary | ICD-10-CM

## 2013-03-01 DIAGNOSIS — R059 Cough, unspecified: Secondary | ICD-10-CM | POA: Insufficient documentation

## 2013-03-01 DIAGNOSIS — F1010 Alcohol abuse, uncomplicated: Secondary | ICD-10-CM | POA: Insufficient documentation

## 2013-03-01 DIAGNOSIS — F29 Unspecified psychosis not due to a substance or known physiological condition: Secondary | ICD-10-CM

## 2013-03-01 LAB — CBC
Hemoglobin: 17.2 g/dL — ABNORMAL HIGH (ref 13.0–17.0)
MCH: 31.2 pg (ref 26.0–34.0)
Platelets: 310 10*3/uL (ref 150–400)
RBC: 5.52 MIL/uL (ref 4.22–5.81)
WBC: 8.7 10*3/uL (ref 4.0–10.5)

## 2013-03-01 LAB — COMPREHENSIVE METABOLIC PANEL
ALT: 16 U/L (ref 0–53)
AST: 24 U/L (ref 0–37)
Alkaline Phosphatase: 63 U/L (ref 39–117)
CO2: 20 mEq/L (ref 19–32)
Calcium: 10.3 mg/dL (ref 8.4–10.5)
GFR calc Af Amer: >90 mL/min (ref >90)
GFR calc non Af Amer: >90 mL/min (ref >90)
Glucose, Bld: 95 mg/dL (ref 70–99)
Potassium: 3.9 mEq/L (ref 3.5–5.1)
Sodium: 137 mEq/L (ref 135–145)

## 2013-03-01 LAB — RAPID URINE DRUG SCREEN, HOSP PERFORMED
Amphetamines: NOT DETECTED
Barbiturates: NOT DETECTED
Cocaine: NOT DETECTED
Opiates: NOT DETECTED
Tetrahydrocannabinol: POSITIVE — AB

## 2013-03-01 LAB — ACETAMINOPHEN LEVEL: Acetaminophen (Tylenol), Serum: <15 ug/mL (ref 10–30)

## 2013-03-01 MED ORDER — DIVALPROEX SODIUM 500 MG PO DR TAB
1500.0000 mg | DELAYED_RELEASE_TABLET | Freq: Every day | ORAL | Status: DC
  Administered 2013-03-01: 1500 mg via ORAL
  Filled 2013-03-01: qty 3

## 2013-03-01 MED ORDER — TRAZODONE HCL 100 MG PO TABS
100.0000 mg | ORAL_TABLET | Freq: Every day | ORAL | Status: DC
  Administered 2013-03-01: 100 mg via ORAL
  Filled 2013-03-01: qty 1

## 2013-03-01 MED ORDER — HYDROXYZINE PAMOATE 25 MG PO CAPS
25.0000 mg | ORAL_CAPSULE | Freq: Four times a day (QID) | ORAL | Status: DC | PRN
  Administered 2013-03-01: 25 mg via ORAL
  Filled 2013-03-01: qty 1

## 2013-03-01 MED ORDER — FLUOXETINE HCL 20 MG PO CAPS
20.0000 mg | ORAL_CAPSULE | Freq: Every morning | ORAL | Status: DC
  Administered 2013-03-01 – 2013-03-02 (×2): 20 mg via ORAL
  Filled 2013-03-01 (×2): qty 1

## 2013-03-01 MED ORDER — LURASIDONE HCL 80 MG PO TABS
80.0000 mg | ORAL_TABLET | Freq: Every day | ORAL | Status: DC
  Administered 2013-03-01 – 2013-03-02 (×2): 80 mg via ORAL
  Filled 2013-03-01 (×3): qty 1

## 2013-03-01 MED ORDER — CLONIDINE HCL 0.1 MG PO TABS
0.1000 mg | ORAL_TABLET | Freq: Two times a day (BID) | ORAL | Status: DC
  Administered 2013-03-01: 0.1 mg via ORAL
  Filled 2013-03-01: qty 1

## 2013-03-01 NOTE — ED Provider Notes (Signed)
History    CSN: [id number] Arrival date & time 03/01/13  1128  First MD Initiated Contact with Patient 03/01/13 1140     Chief Complaint  Patient presents with  . Medical Clearance   (Consider location/radiation/quality/duration/timing/severity/associated sxs/prior Treatment) HPI Comments: Patient presents to the ER for evaluation of depression. Patient has a long history of depression, PTSD and alcohol and drug abuse. Patient has been "living on the streets". He called his mother, telling her he was suicidal. She brought him to the ER. Patient does report that he is still suicidal, but has not identified a plan. He does have a previous suicide attempt and multiple psychiatric hospitalizations. Patient reports that he has had occasional nausea and vomiting and also has a cough.  Past Medical History  Diagnosis Date  . PTSD (post-traumatic stress disorder)   . Anxiety   . Depression   . Bipolar 1 disorder   . Schizophrenia   . Hepatitis   . Cancer    Past Surgical History  Procedure Laterality Date  . Leg surgery     No family history on file. History  Substance Use Topics  . Smoking status: Current Every Day Smoker -- 1.00 packs/day    Types: Cigarettes  . Smokeless tobacco: Never Used  . Alcohol Use: Yes     Comment: 12-18 packs of beer/day    Review of Systems  Constitutional: Negative for fever.  Respiratory: Positive for cough.   Gastrointestinal: Positive for nausea and vomiting.  All other systems reviewed and are negative.    Allergies  Phenytoin sodium extended; Pineapple; and Risperdal  Home Medications   Current Outpatient Rx  Name  Route  Sig  Dispense  Refill  . cloNIDine (CATAPRES) 0.1 MG tablet   Oral   Take 0.1 mg by mouth 2 (two) times daily.         . cyclobenzaprine (FLEXERIL) 10 MG tablet   Oral   Take 1 tablet (10 mg total) by mouth 2 (two) times daily as needed for muscle spasms.   20 tablet   0   . divalproex (DEPAKOTE) 500 MG  DR tablet   Oral   Take 1,500 mg by mouth at bedtime.         Marland Kitchen FLUoxetine (PROZAC) 20 MG capsule   Oral   Take 20 mg by mouth every morning.         . hydrOXYzine (VISTARIL) 25 MG capsule   Oral   Take 25 mg by mouth 4 (four) times daily as needed for anxiety. For anxiety         . ibuprofen (ADVIL,MOTRIN) 800 MG tablet   Oral   Take 1 tablet (800 mg total) by mouth 3 (three) times daily.   21 tablet   0   . lurasidone (LATUDA) 80 MG TABS   Oral   Take 80 mg by mouth at bedtime.          . sertraline (ZOLOFT) 25 MG tablet   Oral   Take 25 mg by mouth every morning.         . traMADol (ULTRAM) 50 MG tablet   Oral   Take 1 tablet (50 mg total) by mouth every 6 (six) hours as needed for pain.   15 tablet   0   . traZODone (DESYREL) 100 MG tablet   Oral   Take 100 mg by mouth at bedtime.          BP 122/90  Temp(Src) 98.2  F (36.8 C) (Oral)  Resp 24  SpO2 98% Physical Exam  Constitutional: He is oriented to person, place, and time. He appears well-developed and well-nourished. No distress.  HENT:  Head: Normocephalic and atraumatic.  Right Ear: Hearing normal.  Left Ear: Hearing normal.  Nose: Nose normal.  Mouth/Throat: Oropharynx is clear and moist and mucous membranes are normal.  Eyes: Conjunctivae and EOM are normal. Pupils are equal, round, and reactive to light.  Neck: Normal range of motion. Neck supple.  Cardiovascular: Regular rhythm, S1 normal and S2 normal.  Exam reveals no gallop and no friction rub.   No murmur heard. Pulmonary/Chest: Effort normal and breath sounds normal. No respiratory distress. He exhibits no tenderness.  Abdominal: Soft. Normal appearance and bowel sounds are normal. There is no hepatosplenomegaly. There is no tenderness. There is no rebound, no guarding, no tenderness at McBurney's point and negative Murphy's sign. No hernia.  Musculoskeletal: Normal range of motion.  Neurological: He is alert and oriented to  person, place, and time. He has normal strength. No cranial nerve deficit or sensory deficit. Coordination normal. GCS eye subscore is 4. GCS verbal subscore is 5. GCS motor subscore is 6.  Skin: Skin is warm, dry and intact. No rash noted. No cyanosis.  Psychiatric: His mood appears anxious. His speech is delayed. He is withdrawn. He exhibits a depressed mood. He expresses suicidal ideation.    ED Course  Procedures (including critical care time) Labs Reviewed  ACETAMINOPHEN LEVEL  CBC  COMPREHENSIVE METABOLIC PANEL  ETHANOL  SALICYLATE LEVEL  URINE RAPID DRUG SCREEN (HOSP PERFORMED)   No results found.   diagnosis: Depression  MDM  Patient presents to ER for evaluation of increasing depression. Patient is suicidal at time of evaluation. Workup was unremarkable, patient medically cleared for psychiatric treatment.  [name], MD 03/05/13 [phone]

## 2013-03-01 NOTE — ED Notes (Signed)
Report to Psych ED

## 2013-03-01 NOTE — ED Notes (Signed)
Oriented pt. To psych ed.

## 2013-03-01 NOTE — ED Notes (Signed)
Per report from acute ED, pt. Had chest X-ray this morning and results were clear.

## 2013-03-01 NOTE — ED Notes (Signed)
Pt complains of "feeling bad and I'm not taking my medication" Pt admits to suicidal ideation at this time.

## 2013-03-01 NOTE — Progress Notes (Signed)
[facility] has seen patient and provided him with a oc application. °

## 2013-03-01 NOTE — BH Assessment (Signed)
[facility] Assessment Progress Note   Per Shavon Rankin note (NP) pt is to be monitored overnight and will be re-evaluated in the AM by the extender to determine if pt still needs to be in ED or if he can be more appropriately tx in optx.  If needed [facility] will get involve for placement.  If pt is d/c appropriate referral info will be given.

## 2013-03-01 NOTE — ED Notes (Signed)
MD at bedside. 

## 2013-03-01 NOTE — Consult Note (Signed)
Reason for Consult: Evaluation for inpatient treatment Referring Physician:  EDP  Carole Deere is an 27 y.o. male.  HPI: Patient presents to [facility] with complaints of SI and depression.   Unable to fully question patient related to patient refusal to answer questions.  Patient states that he is SI with a plan to overdose.  Patient denies HI, psychosis, and paranoia.  Patient states that he has outpatient services through [facility] and it has been 6 weeks since his last visit.  Patient states that he has been out of his medications for one week.  Then patient refused to answer anymore questions stating "I am coming off of a cocaine high and I aint ate or slept in three days and don't feel like talking.  I'm sleepy and hungry."  Past Medical History  Diagnosis Date  . PTSD (post-traumatic stress disorder)   . Anxiety   . Depression   . Bipolar 1 disorder   . Schizophrenia   . Hepatitis   . Cancer     Past Surgical History  Procedure Laterality Date  . Leg surgery      No family history on file.  Social History:  reports that he has been smoking Cigarettes.  He has been smoking about 1.00 pack per day. He has never used smokeless tobacco. He reports that  drinks alcohol. He reports that he uses illicit drugs (Methamphetamines, Heroin, Marijuana, and IV).  Allergies:  Allergies  Allergen Reactions  . Phenytoin Sodium Extended Other (See Comments)    Gets sick to his stomach, and closes throat up  . Pineapple Anaphylaxis  . Risperdal (Risperidone) Shortness Of Breath    'throat closes up'    Medications: I have reviewed the patient's current medications.  Results for orders placed during the hospital encounter of 03/01/13 (from the past 48 hour(s))  URINE RAPID DRUG SCREEN (HOSP PERFORMED)     Status: Abnormal   Collection Time    03/01/13 11:52 AM      Result Value Range   Opiates NONE DETECTED  NONE DETECTED   Cocaine NONE DETECTED  NONE DETECTED   Benzodiazepines NONE DETECTED   NONE DETECTED   Amphetamines NONE DETECTED  NONE DETECTED   Tetrahydrocannabinol POSITIVE (*) NONE DETECTED   Barbiturates NONE DETECTED  NONE DETECTED   Comment:            DRUG SCREEN FOR MEDICAL PURPOSES     ONLY.  IF CONFIRMATION IS NEEDED     FOR ANY PURPOSE, NOTIFY LAB     WITHIN 5 DAYS.                LOWEST DETECTABLE LIMITS     FOR URINE DRUG SCREEN     Drug Class       Cutoff (ng/mL)     Amphetamine      1000     Barbiturate      200     Benzodiazepine   200     Tricyclics       300     Opiates          300     Cocaine          300     THC              50  ACETAMINOPHEN LEVEL     Status: None   Collection Time    03/01/13 12:05 PM      Result Value Range   Acetaminophen (Tylenol),  Serum <15.0  10 - 30 ug/mL   Comment:            THERAPEUTIC CONCENTRATIONS VARY     SIGNIFICANTLY. A RANGE OF 10-30     ug/mL MAY BE AN EFFECTIVE     CONCENTRATION FOR MANY PATIENTS.     HOWEVER, SOME ARE BEST TREATED     AT CONCENTRATIONS OUTSIDE THIS     RANGE.     ACETAMINOPHEN CONCENTRATIONS     >150 ug/mL AT 4 HOURS AFTER     INGESTION AND >50 ug/mL AT 12     HOURS AFTER INGESTION ARE     OFTEN ASSOCIATED WITH TOXIC     REACTIONS.  CBC     Status: Abnormal   Collection Time    03/01/13 12:05 PM      Result Value Range   WBC 8.7  4.0 - 10.5 K/uL   RBC 5.52  4.22 - 5.81 MIL/uL   Hemoglobin 17.2 (*) 13.0 - 17.0 g/dL   HCT 14.7  82.9 - 56.2 %   MCV 87.5  78.0 - 100.0 fL   MCH 31.2  26.0 - 34.0 pg   MCHC 35.6  30.0 - 36.0 g/dL   RDW 13.0  86.5 - 78.4 %   Platelets 310  150 - 400 K/uL  COMPREHENSIVE METABOLIC PANEL     Status: Abnormal   Collection Time    03/01/13 12:05 PM      Result Value Range   Sodium 137  135 - 145 mEq/L   Potassium 3.9  3.5 - 5.1 mEq/L   Chloride 103  96 - 112 mEq/L   CO2 20  19 - 32 mEq/L   Glucose, Bld 95  70 - 99 mg/dL   BUN 14  6 - 23 mg/dL   Creatinine, Ser 6.96  0.50 - 1.35 mg/dL   Calcium 29.5  8.4 - 28.4 mg/dL   Total Protein 8.4 (*)  6.0 - 8.3 g/dL   Albumin 4.7  3.5 - 5.2 g/dL   AST 24  0 - 37 U/L   ALT 16  0 - 53 U/L   Alkaline Phosphatase 63  39 - 117 U/L   Total Bilirubin 0.7  0.3 - 1.2 mg/dL   GFR calc non Af Amer >90  >90 mL/min   GFR calc Af Amer >90  >90 mL/min   Comment:            The eGFR has been calculated     using the CKD EPI equation.     This calculation has not been     validated in all clinical     situations.     eGFR's persistently     <90 mL/min signify     possible Chronic Kidney Disease.  ETHANOL     Status: None   Collection Time    03/01/13 12:05 PM      Result Value Range   Alcohol, Ethyl (B) <11  0 - 11 mg/dL   Comment:            LOWEST DETECTABLE LIMIT FOR     SERUM ALCOHOL IS 11 mg/dL     FOR MEDICAL PURPOSES ONLY  SALICYLATE LEVEL     Status: Abnormal   Collection Time    03/01/13 12:05 PM      Result Value Range   Salicylate Lvl <2.0 (*) 2.8 - 20.0 mg/dL    Dg Chest 2 View  1/32/4401   *RADIOLOGY REPORT*  Clinical Data: Chronic cough and shortness of breath  CHEST - 2 VIEW  Comparison: July 05, 2012  Findings:  Lungs clear.  Heart size and pulmonary vascularity are normal.  No adenopathy.  No bone lesions.  IMPRESSION: No abnormality noted.   Original Report Authenticated By: Bretta Bang, M.D.    Review of Systems  Psychiatric/Behavioral: Positive for depression, suicidal ideas and substance abuse.   Blood pressure 122/90, temperature 98.2 F (36.8 C), temperature source Oral, resp. rate 24, SpO2 98.00%. Physical Exam  Constitutional: He is oriented to person, place, and time.  HENT:  Head: Normocephalic and atraumatic.  Neck: Normal range of motion.  Respiratory: Effort normal.  Musculoskeletal: Normal range of motion.  Neurological: He is alert and oriented to person, place, and time.  Skin: Skin is warm and dry.   Face to face interview and consulted with Dr. Lucianne Muss  Assessment/Plan:  Axis I: Depressive Disorder NOS and Polysubstance  abuse  Recommendation: Monitor patient over night and re-evaluate for suicidal thought in the AM.   Rankin, Shuvon, FNP-BC 03/01/2013, 4:05 PM   03/02/2013  Mr Lough was with his mother the second time I evaluated him and his mood and attitude were much improved.  He is saying he is not suicidal now that he is further along in coming off the cocaine he used yesterday and wants to go home.  His mother says she has ben going through this for years.  Currently the court is involved so he might be forced to get help again.  He has not responded to anything she  been able to do she says.  He may be released today with the plan to stay sober long enough for court or go to jail.

## 2013-03-02 DIAGNOSIS — F1910 Other psychoactive substance abuse, uncomplicated: Secondary | ICD-10-CM

## 2013-03-02 MED ORDER — HYDROXYZINE HCL 25 MG PO TABS
25.0000 mg | ORAL_TABLET | ORAL | Status: DC | PRN
  Administered 2013-03-02: 25 mg via ORAL
  Filled 2013-03-02: qty 1

## 2013-03-02 NOTE — ED Provider Notes (Signed)
He is here for substance abuse and suicidality. His mother is now with him and is concerned about his substance abuse and continued suicidal ideation. She is unable to be with him all the time, because she works. She states that he has been sniffing gasoline as well. Currently, the patient wants to go home, feeling that he is improved and stable.  Evaluation: Anxious, conversant, continually moving mouth in chewing fashion- appears hyperstimulated. Possible withdrawal sx., vs stimulant medication toxicity, vs. Baseline anxiety.   Patient is not psychiatrically stable. He will likely need placement. He has been seen by psychiatry (Dr. Ladona Ridgel) this morning, who is working on placement with [facility].    Flint Melter, MD 03/02/13 [phone]

## 2013-03-02 NOTE — ED Notes (Signed)
Signed No Harm Contract 

## 2013-03-02 NOTE — [facility] Counselor (Signed)
Patient  signed a no harm contract and was given the number to [facility].  Patient was given outpatient referrals.

## 2013-08-23 ENCOUNTER — Emergency Department (HOSPITAL_COMMUNITY)
Admission: EM | Admit: 2013-08-23 | Discharge: 2013-08-23 | Disposition: A | Discharge status: Home / Self Care | Payer: PRIVATE HEALTH INSURANCE | Attending: Emergency Medicine | Admitting: Emergency Medicine

## 2013-08-23 ENCOUNTER — Encounter (HOSPITAL_COMMUNITY): Payer: Self-pay | Admitting: Emergency Medicine

## 2013-08-23 DIAGNOSIS — R5381 Other malaise: Secondary | ICD-10-CM | POA: Insufficient documentation

## 2013-08-23 DIAGNOSIS — F209 Schizophrenia, unspecified: Secondary | ICD-10-CM | POA: Insufficient documentation

## 2013-08-23 DIAGNOSIS — H6690 Otitis media, unspecified, unspecified ear: Secondary | ICD-10-CM | POA: Insufficient documentation

## 2013-08-23 DIAGNOSIS — F411 Generalized anxiety disorder: Secondary | ICD-10-CM | POA: Insufficient documentation

## 2013-08-23 DIAGNOSIS — Z8719 Personal history of other diseases of the digestive system: Secondary | ICD-10-CM | POA: Insufficient documentation

## 2013-08-23 DIAGNOSIS — J069 Acute upper respiratory infection, unspecified: Secondary | ICD-10-CM | POA: Insufficient documentation

## 2013-08-23 DIAGNOSIS — F17200 Nicotine dependence, unspecified, uncomplicated: Secondary | ICD-10-CM | POA: Insufficient documentation

## 2013-08-23 DIAGNOSIS — IMO0001 Reserved for inherently not codable concepts without codable children: Secondary | ICD-10-CM | POA: Insufficient documentation

## 2013-08-23 DIAGNOSIS — F319 Bipolar disorder, unspecified: Secondary | ICD-10-CM | POA: Insufficient documentation

## 2013-08-23 DIAGNOSIS — R059 Cough, unspecified: Secondary | ICD-10-CM | POA: Insufficient documentation

## 2013-08-23 DIAGNOSIS — H6692 Otitis media, unspecified, left ear: Secondary | ICD-10-CM

## 2013-08-23 DIAGNOSIS — F4310 Post-traumatic stress disorder, unspecified: Secondary | ICD-10-CM | POA: Insufficient documentation

## 2013-08-23 DIAGNOSIS — H9209 Otalgia, unspecified ear: Secondary | ICD-10-CM | POA: Insufficient documentation

## 2013-08-23 DIAGNOSIS — R05 Cough: Secondary | ICD-10-CM | POA: Insufficient documentation

## 2013-08-23 DIAGNOSIS — Z79899 Other long term (current) drug therapy: Secondary | ICD-10-CM | POA: Insufficient documentation

## 2013-08-23 DIAGNOSIS — R5383 Other fatigue: Secondary | ICD-10-CM

## 2013-08-23 MED ORDER — HYDROCOD POLST-CHLORPHEN POLST 10-8 MG/5ML PO LQCR
5.0000 mL | Freq: Once | ORAL | Status: AC
  Administered 2013-08-23: 5 mL via ORAL
  Filled 2013-08-23: qty 5

## 2013-08-23 MED ORDER — HYDROCODONE-ACETAMINOPHEN 5-325 MG PO TABS: 1.0000 | ORAL_TABLET | ORAL | Status: DC | PRN

## 2013-08-23 MED ORDER — AMOXICILLIN 500 MG PO CAPS: 500.0000 mg | ORAL_CAPSULE | Freq: Three times a day (TID) | ORAL | Status: DC

## 2013-08-23 MED ORDER — AMOXICILLIN 500 MG PO CAPS
500.0000 mg | ORAL_CAPSULE | Freq: Once | ORAL | Status: AC
  Administered 2013-08-23: 500 mg via ORAL
  Filled 2013-08-23: qty 1

## 2013-08-23 MED ORDER — HYDROCOD POLST-CHLORPHEN POLST 10-8 MG/5ML PO LQCR: 5.0000 mL | Freq: Two times a day (BID) | ORAL | Status: DC | PRN

## 2013-08-23 NOTE — ED Provider Notes (Signed)
CSN: [id number]     Arrival date & time 08/23/13  0132 History   First MD Initiated Contact with Patient 08/23/13 [phone]     Chief Complaint  Patient presents with  . Sore Throat  . Cough   (Consider location/radiation/quality/duration/timing/severity/associated sxs/prior Treatment) HPI History provided by pt.   Pt presents w/ severe sore throat x 3-4 days.  Associated w/ hoarseness, L ear pain, cough, body aches and fatigue.  Denies fever, rhinorrhea, nasal congestion, chest pain and SOB.  No relief w/ robitussin. No known sick contacts.   H/o psychiatric disease and hepatitis.  Past Medical History  Diagnosis Date  . PTSD (post-traumatic stress disorder)   . Anxiety   . Depression   . Bipolar 1 disorder   . Schizophrenia   . Hepatitis   . Cancer    Past Surgical History  Procedure Laterality Date  . Leg surgery     History reviewed. No pertinent family history. History  Substance Use Topics  . Smoking status: Current Every Day Smoker -- 1.00 packs/day    Types: Cigarettes  . Smokeless tobacco: Never Used  . Alcohol Use: Yes     Comment: 12-18 packs of beer/day    Review of Systems  All other systems reviewed and are negative.    Allergies  Phenytoin sodium extended; Pineapple; and Risperdal  Home Medications   Current Outpatient Rx  Name  Route  Sig  Dispense  Refill  . cloNIDine (CATAPRES) 0.1 MG tablet   Oral   Take 0.1 mg by mouth 2 (two) times daily.         . divalproex (DEPAKOTE) 500 MG DR tablet   Oral   Take 1,500 mg by mouth at bedtime.         Marland Kitchen FLUoxetine (PROZAC) 20 MG capsule   Oral   Take 20 mg by mouth every morning.         Marland Kitchen guaifenesin (ROBITUSSIN) 100 MG/5ML syrup   Oral   Take 200 mg by mouth 3 (three) times daily as needed for cough.         . hydrOXYzine (VISTARIL) 25 MG capsule   Oral   Take 25 mg by mouth 4 (four) times daily as needed for anxiety. For anxiety         . lurasidone (LATUDA) 80 MG TABS   Oral   Take  80 mg by mouth daily with breakfast.         . Lurasidone HCl (LATUDA) 20 MG TABS   Oral   Take 2 tablets by mouth every morning. Take with the 80 mg tablet         . traZODone (DESYREL) 100 MG tablet   Oral   Take 100 mg by mouth at bedtime.         Marland Kitchen amoxicillin (AMOXIL) 500 MG capsule   Oral   Take 1 capsule (500 mg total) by mouth 3 (three) times daily.   20 capsule   0   . HYDROcodone-acetaminophen (NORCO/VICODIN) 5-325 MG per tablet   Oral   Take 1 tablet by mouth every 4 (four) hours as needed for moderate pain.   12 tablet   0    BP 153/84  Pulse 88  Temp(Src) 98.3 F (36.8 C) (Oral)  Resp 16  Wt 261 lb (118.389 kg)  SpO2 94% Physical Exam  Nursing note and vitals reviewed. Constitutional: He is oriented to person, place, and time. He appears well-developed and well-nourished. No distress.  HENT:  Head: Normocephalic and atraumatic.  L TM bulging and erythematous. Mild erythema soft palate and posterior pharynx.  No tonsillar edema/exudate.  Uvula mid-line and no trismus.  Eyes:  Normal appearance  Neck: Normal range of motion.  Cardiovascular: Normal rate and regular rhythm.   Pulmonary/Chest: Effort normal and breath sounds normal. No respiratory distress.  coughing  Musculoskeletal: Normal range of motion.  Lymphadenopathy:    He has no cervical adenopathy.  Neurological: He is alert and oriented to person, place, and time.  Skin: Skin is warm and dry. No rash noted.  Psychiatric: He has a normal mood and affect. His behavior is normal.    ED Course  Procedures (including critical care time) Labs Review Labs Reviewed - No data to display Imaging Review No results found.  EKG Interpretation   None       MDM   1. Viral URI   2. Otitis media, left    28yo M w/ hepatitis and psychiatric disease presents w/ cough, sore throat and L ear pain.  Suspect viral URI but because of severity of ear pain and bulging, erythematous TM on exam, will  treat for possible bacterial OM.  Non-toxic appearance, mildly dehydrated based on tachycardia, no respiratory distress on exam. Pt received first dose of amocixillin as well as tussionex and oral fluids.  Cough and pain improved.  HR and BP improved at time of discharge. Return precautions discussed.     Remer Macho, PA-C 08/23/13 2057

## 2013-08-23 NOTE — ED Notes (Signed)
Patient reports that he has a sore throat, cough and ear ache

## 2013-08-23 NOTE — Discharge Instructions (Signed)
Take antibiotic as prescribed. Take tussionex at night as needed for cough.  Do not drive within four hours of taking this medication (may cause drowsiness or confusion).  Take ibuprofen as well; up to 800mg  three times a day with food.   Do not drive within four hours of taking this medication (may cause drowsiness or confusion).   Get rest and drink plenty of fluids.  Return to the ER if you have worsening ear pain or cough or develop difficulty breathing.

## 2013-08-26 NOTE — ED Provider Notes (Signed)
Medical screening examination/treatment/procedure(s) were performed by non-physician practitioner and as supervising physician I was immediately available for consultation/collaboration.   Delora Fuel, MD 14/23/95 3202

## 2013-12-30 ENCOUNTER — Emergency Department (HOSPITAL_COMMUNITY): Payer: PRIVATE HEALTH INSURANCE

## 2013-12-30 ENCOUNTER — Emergency Department (HOSPITAL_COMMUNITY)
Admission: EM | Admit: 2013-12-30 | Discharge: 2013-12-30 | Disposition: A | Discharge status: Home / Self Care | Payer: PRIVATE HEALTH INSURANCE | Attending: Emergency Medicine | Admitting: Emergency Medicine

## 2013-12-30 ENCOUNTER — Encounter (HOSPITAL_COMMUNITY): Payer: Self-pay | Admitting: Emergency Medicine

## 2013-12-30 DIAGNOSIS — F4310 Post-traumatic stress disorder, unspecified: Secondary | ICD-10-CM | POA: Insufficient documentation

## 2013-12-30 DIAGNOSIS — S4980XA Other specified injuries of shoulder and upper arm, unspecified arm, initial encounter: Secondary | ICD-10-CM | POA: Insufficient documentation

## 2013-12-30 DIAGNOSIS — M25512 Pain in left shoulder: Secondary | ICD-10-CM

## 2013-12-30 DIAGNOSIS — Z79899 Other long term (current) drug therapy: Secondary | ICD-10-CM | POA: Insufficient documentation

## 2013-12-30 DIAGNOSIS — Y929 Unspecified place or not applicable: Secondary | ICD-10-CM | POA: Insufficient documentation

## 2013-12-30 DIAGNOSIS — Y9389 Activity, other specified: Secondary | ICD-10-CM | POA: Insufficient documentation

## 2013-12-30 DIAGNOSIS — W010XXA Fall on same level from slipping, tripping and stumbling without subsequent striking against object, initial encounter: Secondary | ICD-10-CM | POA: Insufficient documentation

## 2013-12-30 DIAGNOSIS — Z8659 Personal history of other mental and behavioral disorders: Secondary | ICD-10-CM | POA: Insufficient documentation

## 2013-12-30 DIAGNOSIS — Z8719 Personal history of other diseases of the digestive system: Secondary | ICD-10-CM | POA: Insufficient documentation

## 2013-12-30 DIAGNOSIS — S46909A Unspecified injury of unspecified muscle, fascia and tendon at shoulder and upper arm level, unspecified arm, initial encounter: Secondary | ICD-10-CM | POA: Insufficient documentation

## 2013-12-30 DIAGNOSIS — F411 Generalized anxiety disorder: Secondary | ICD-10-CM | POA: Insufficient documentation

## 2013-12-30 DIAGNOSIS — X500XXA Overexertion from strenuous movement or load, initial encounter: Secondary | ICD-10-CM | POA: Insufficient documentation

## 2013-12-30 DIAGNOSIS — Z792 Long term (current) use of antibiotics: Secondary | ICD-10-CM | POA: Insufficient documentation

## 2013-12-30 DIAGNOSIS — F319 Bipolar disorder, unspecified: Secondary | ICD-10-CM | POA: Insufficient documentation

## 2013-12-30 DIAGNOSIS — F17200 Nicotine dependence, unspecified, uncomplicated: Secondary | ICD-10-CM | POA: Insufficient documentation

## 2013-12-30 DIAGNOSIS — Z859 Personal history of malignant neoplasm, unspecified: Secondary | ICD-10-CM | POA: Insufficient documentation

## 2013-12-30 MED ORDER — METHOCARBAMOL 500 MG PO TABS: 500.0000 mg | ORAL_TABLET | Freq: Two times a day (BID) | ORAL | Status: DC | PRN

## 2013-12-30 NOTE — ED Notes (Addendum)
Pt has hx of shoulder injury.  Pt fell yesterday to curb getting mail.  Pt c/o left arm/shoulder pain.  Able to raise arm midway.  No LOC head or head trauma.

## 2013-12-30 NOTE — Discharge Instructions (Signed)
Take the prescribed medication as directed.  May wish to apply heat to affected area for added relief. Follow-up with your primary care physician if problems occur. Return to the ED for new or worsening symptoms.

## 2013-12-30 NOTE — ED Provider Notes (Signed)
CSN: [id number]     Arrival date & time 12/30/13  1805 History  This chart was scribed for non-physician practitionerLisa Mathews [name],  working with Tanna Furry, MD, by Neta Ehlers, ED Scribe. This patient was seen in room WTR6/WTR6 and the patient's care was started at 6:57 PM.  First MD Initiated Contact with Patient 12/30/13 1856     Chief Complaint  Patient presents with  . Shoulder Pain    The history is provided by the patient. No language interpreter was used.   HPI Comments: Brian [name] is a 28 y.o. male who presents to the Emergency Department complaining of left shoulder pain which was began after he tripped over a curb yesterday. The pt denies head trama or LOC. He states the shoulder pain improved yesterday, but began again after he carried a gallon of juice. The pt is left-handed. No prior left shoulder injuries or surgeries.  Denies numbness/paresthesias or weakness of LUE.   Past Medical History  Diagnosis Date  . PTSD (post-traumatic stress disorder)   . Anxiety   . Depression   . Bipolar 1 disorder   . Schizophrenia   . Hepatitis   . Cancer    Past Surgical History  Procedure Laterality Date  . Leg surgery     History reviewed. No pertinent family history. History  Substance Use Topics  . Smoking status: Current Every Day Smoker -- 1.00 packs/day    Types: Cigarettes  . Smokeless tobacco: Never Used  . Alcohol Use: Yes     Comment: 12-18 packs of beer/day    Review of Systems  Musculoskeletal: Positive for myalgias.  Neurological: Negative for syncope.    Allergies  Phenytoin sodium extended; Pineapple; and Risperdal  Home Medications   Prior to Admission medications   Medication Sig Start Date End Date Taking? Authorizing Provider  amoxicillin (AMOXIL) 500 MG capsule Take 1 capsule (500 mg total) by mouth 3 (three) times daily. 08/23/13   Arville Lime Schinlever, PA-C  cloNIDine (CATAPRES) 0.1 MG tablet Take 0.1 mg by mouth 2 (two) times daily.     Historical Provider, MD  divalproex (DEPAKOTE) 500 MG DR tablet Take 1,500 mg by mouth at bedtime.    Historical Provider, MD  FLUoxetine (PROZAC) 20 MG capsule Take 20 mg by mouth every morning.    Historical Provider, MD  guaifenesin (ROBITUSSIN) 100 MG/5ML syrup Take 200 mg by mouth 3 (three) times daily as needed for cough.    Historical Provider, MD  HYDROcodone-acetaminophen (NORCO/VICODIN) 5-325 MG per tablet Take 1 tablet by mouth every 4 (four) hours as needed for moderate pain. 08/23/13   Arville Lime Schinlever, PA-C  hydrOXYzine (VISTARIL) 25 MG capsule Take 25 mg by mouth 4 (four) times daily as needed for anxiety. For anxiety    Historical Provider, MD  lurasidone (LATUDA) 80 MG TABS Take 80 mg by mouth daily with breakfast.    Historical Provider, MD  Lurasidone HCl (LATUDA) 20 MG TABS Take 2 tablets by mouth every morning. Take with the 80 mg tablet    Historical Provider, MD  traZODone (DESYREL) 100 MG tablet Take 100 mg by mouth at bedtime.    Historical Provider, MD   Triage Vitals: BP 123/81  Pulse 61  Temp(Src) 98.1 F (36.7 C) (Oral)  Resp 18  SpO2 99%  Physical Exam  Nursing note and vitals reviewed. Constitutional: He is oriented to person, place, and time. He appears well-developed and well-nourished. No distress.  HENT:  Head: Normocephalic and atraumatic.  Eyes: EOM are normal.  Neck: Neck supple. No tracheal deviation present.  Cardiovascular: Normal rate.   Pulmonary/Chest: Effort normal. No respiratory distress.  Musculoskeletal: He exhibits tenderness.       Left shoulder: He exhibits decreased range of motion, tenderness, pain and spasm. He exhibits no bony tenderness and no deformity.       Arms: Tenderness over left trapezius with spasm present. No gross deformity.  Limited ROM of shoulder due to pain. Neurovascularly intact.   Neurological: He is alert and oriented to person, place, and time.  Skin: Skin is warm and dry.  Psychiatric: He has a normal  mood and affect. His behavior is normal.    ED Course  Procedures (including critical care time)  DIAGNOSTIC STUDIES: Oxygen Saturation is 99% on room air, normal by my interpretation.    COORDINATION OF CARE:  6:59 PM- Discussed treatment plan with patient, and the patient agreed to the plan. The plan includes use of heat and a muscle relaxant.   Labs Review Labs Reviewed - No data to display  Imaging Review Dg Shoulder Left  12/30/2013   CLINICAL DATA:  SHOULDER PAIN  EXAM: LEFT SHOULDER - 2+ VIEW  COMPARISON:  None.  FINDINGS: There is no evidence of fracture or dislocation. There is no evidence of arthropathy or other focal bone abnormality. Soft tissues are unremarkable.  IMPRESSION: Negative.   Electronically Signed   By: Margaree Mackintosh M.D.   On: 12/30/2013 18:25     EKG Interpretation None      MDM   Final diagnoses:  Shoulder pain, left   X-ray negative for acute findings. Left upper extremity neurovascularly intact. Suspect muscular strain. Patient will be started on Robaxin, recommended heat therapy at home for added relief. He will follow with his primary care physician if problems occur.  Discussed plan with patient, he/she acknowledged understanding and agreed with plan of care.  Return precautions given for new or worsening symptoms.  I personally performed the services described in this documentation, which was scribed in my presence. The recorded information has been reviewed and is accurate.  [name], PA-C 12/30/13 1954

## 2014-01-02 NOTE — ED Provider Notes (Signed)
Medical screening examination/treatment/procedure(s) were performed by non-physician practitioner and as supervising physician I was immediately available for consultation/collaboration.   EKG Interpretation None        Wynter Grave, MD 01/02/14 0829 

## 2014-01-12 ENCOUNTER — Emergency Department (HOSPITAL_COMMUNITY): Payer: PRIVATE HEALTH INSURANCE

## 2014-01-12 ENCOUNTER — Emergency Department (HOSPITAL_COMMUNITY)
Admission: EM | Admit: 2014-01-12 | Discharge: 2014-01-13 | Disposition: A | Discharge status: Other Acute Inpatient Hospital | Payer: Federal, State, Local not specified - Other | Attending: Emergency Medicine | Admitting: Emergency Medicine

## 2014-01-12 ENCOUNTER — Encounter (HOSPITAL_COMMUNITY): Payer: Self-pay | Admitting: Emergency Medicine

## 2014-01-12 DIAGNOSIS — F1920 Other psychoactive substance dependence, uncomplicated: Secondary | ICD-10-CM

## 2014-01-12 DIAGNOSIS — F1220 Cannabis dependence, uncomplicated: Secondary | ICD-10-CM | POA: Insufficient documentation

## 2014-01-12 DIAGNOSIS — R1032 Left lower quadrant pain: Secondary | ICD-10-CM | POA: Insufficient documentation

## 2014-01-12 DIAGNOSIS — F17200 Nicotine dependence, unspecified, uncomplicated: Secondary | ICD-10-CM | POA: Insufficient documentation

## 2014-01-12 DIAGNOSIS — F29 Unspecified psychosis not due to a substance or known physiological condition: Secondary | ICD-10-CM | POA: Insufficient documentation

## 2014-01-12 DIAGNOSIS — F1910 Other psychoactive substance abuse, uncomplicated: Secondary | ICD-10-CM

## 2014-01-12 DIAGNOSIS — F259 Schizoaffective disorder, unspecified: Secondary | ICD-10-CM

## 2014-01-12 DIAGNOSIS — F250 Schizoaffective disorder, bipolar type: Secondary | ICD-10-CM | POA: Diagnosis present

## 2014-01-12 DIAGNOSIS — Z79899 Other long term (current) drug therapy: Secondary | ICD-10-CM | POA: Insufficient documentation

## 2014-01-12 DIAGNOSIS — F1010 Alcohol abuse, uncomplicated: Secondary | ICD-10-CM | POA: Insufficient documentation

## 2014-01-12 DIAGNOSIS — Z85828 Personal history of other malignant neoplasm of skin: Secondary | ICD-10-CM | POA: Insufficient documentation

## 2014-01-12 DIAGNOSIS — F411 Generalized anxiety disorder: Secondary | ICD-10-CM | POA: Insufficient documentation

## 2014-01-12 DIAGNOSIS — R45851 Suicidal ideations: Secondary | ICD-10-CM

## 2014-01-12 DIAGNOSIS — R45850 Homicidal ideations: Secondary | ICD-10-CM

## 2014-01-12 DIAGNOSIS — F319 Bipolar disorder, unspecified: Secondary | ICD-10-CM | POA: Insufficient documentation

## 2014-01-12 DIAGNOSIS — Z8619 Personal history of other infectious and parasitic diseases: Secondary | ICD-10-CM | POA: Insufficient documentation

## 2014-01-12 HISTORY — DX: Unspecified viral hepatitis C without hepatic coma: B19.20

## 2014-01-12 LAB — BASIC METABOLIC PANEL
BUN: 5 mg/dL — ABNORMAL LOW (ref 6–23)
CO2: 17 meq/L — AB (ref 19–32)
CREATININE: 0.88 mg/dL (ref 0.50–1.35)
Calcium: 10 mg/dL (ref 8.4–10.5)
Chloride: 99 mEq/L (ref 96–112)
GFR calc Af Amer: >90 mL/min (ref >90)
Glucose, Bld: 183 mg/dL — ABNORMAL HIGH (ref 70–99)
Potassium: 3.9 mEq/L (ref 3.7–5.3)
Sodium: 140 mEq/L (ref 137–147)

## 2014-01-12 LAB — RAPID URINE DRUG SCREEN, HOSP PERFORMED
Amphetamines: NOT DETECTED
BARBITURATES: NOT DETECTED
Benzodiazepines: NOT DETECTED
COCAINE: NOT DETECTED
OPIATES: NOT DETECTED
TETRAHYDROCANNABINOL: POSITIVE — AB

## 2014-01-12 LAB — HEPATIC FUNCTION PANEL
ALK PHOS: 49 U/L (ref 39–117)
ALT: 44 U/L (ref 0–53)
AST: 60 U/L — ABNORMAL HIGH (ref 0–37)
Albumin: 4.1 g/dL (ref 3.5–5.2)
TOTAL PROTEIN: 7 g/dL (ref 6.0–8.3)
Total Bilirubin: 0.6 mg/dL (ref 0.3–1.2)

## 2014-01-12 LAB — ETHANOL: Alcohol, Ethyl (B): <11 mg/dL (ref 0–11)

## 2014-01-12 LAB — CBC
HCT: 48.1 % (ref 39.0–52.0)
Hemoglobin: 17.4 g/dL — ABNORMAL HIGH (ref 13.0–17.0)
MCH: 32.9 pg (ref 26.0–34.0)
MCHC: 36.2 g/dL — ABNORMAL HIGH (ref 30.0–36.0)
MCV: 90.9 fL (ref 78.0–100.0)
Platelets: 305 10*3/uL (ref 150–400)
RBC: 5.29 MIL/uL (ref 4.22–5.81)
RDW: 12.7 % (ref 11.5–15.5)
WBC: 9.5 10*3/uL (ref 4.0–10.5)

## 2014-01-12 LAB — LIPASE, BLOOD: Lipase: 25 U/L (ref 11–59)

## 2014-01-12 MED ORDER — NICOTINE 21 MG/24HR TD PT24
21.0000 mg | MEDICATED_PATCH | Freq: Every day | TRANSDERMAL | Status: DC
  Administered 2014-01-12 – 2014-01-13 (×2): 21 mg via TRANSDERMAL
  Filled 2014-01-12 (×2): qty 1

## 2014-01-12 MED ORDER — ONDANSETRON HCL 4 MG PO TABS
4.0000 mg | ORAL_TABLET | Freq: Three times a day (TID) | ORAL | Status: DC | PRN
  Administered 2014-01-12: 4 mg via ORAL
  Filled 2014-01-12 (×2): qty 1

## 2014-01-12 MED ORDER — CLONIDINE HCL 0.1 MG PO TABS
0.1000 mg | ORAL_TABLET | Freq: Two times a day (BID) | ORAL | Status: DC
  Administered 2014-01-12 (×2): 0.1 mg via ORAL
  Filled 2014-01-12 (×2): qty 1

## 2014-01-12 MED ORDER — LURASIDONE HCL 80 MG PO TABS
80.0000 mg | ORAL_TABLET | Freq: Every day | ORAL | Status: DC
  Administered 2014-01-13: 80 mg via ORAL
  Filled 2014-01-12 (×2): qty 1

## 2014-01-12 MED ORDER — ZOLPIDEM TARTRATE 5 MG PO TABS: 5.0000 mg | ORAL_TABLET | Freq: Every evening | ORAL | Status: DC | PRN

## 2014-01-12 MED ORDER — IOHEXOL 300 MG/ML  SOLN
100.0000 mL | Freq: Once | INTRAMUSCULAR | Status: AC | PRN
  Administered 2014-01-12: 100 mL via INTRAVENOUS

## 2014-01-12 MED ORDER — TRAZODONE HCL 100 MG PO TABS: 100.0000 mg | ORAL_TABLET | Freq: Every day | ORAL | Status: DC

## 2014-01-12 MED ORDER — LORAZEPAM 1 MG PO TABS: 1.0000 mg | ORAL_TABLET | Freq: Three times a day (TID) | ORAL | Status: DC | PRN

## 2014-01-12 MED ORDER — ONDANSETRON HCL 4 MG/2ML IJ SOLN
INTRAMUSCULAR | Status: AC
  Filled 2014-01-12: qty 2

## 2014-01-12 MED ORDER — IOHEXOL 300 MG/ML  SOLN
50.0000 mL | Freq: Once | INTRAMUSCULAR | Status: AC | PRN
  Administered 2014-01-12: 50 mL via ORAL

## 2014-01-12 MED ORDER — LORAZEPAM 2 MG/ML IJ SOLN
0.0000 mg | Freq: Four times a day (QID) | INTRAMUSCULAR | Status: DC
  Administered 2014-01-12: 1 mg via INTRAVENOUS
  Filled 2014-01-12: qty 1

## 2014-01-12 MED ORDER — VITAMIN B-1 100 MG PO TABS
100.0000 mg | ORAL_TABLET | Freq: Every day | ORAL | Status: DC
  Administered 2014-01-12 – 2014-01-13 (×2): 100 mg via ORAL
  Filled 2014-01-12 (×2): qty 1

## 2014-01-12 MED ORDER — SODIUM CHLORIDE 0.9 % IV BOLUS (SEPSIS)
1000.0000 mL | INTRAVENOUS | Status: AC
  Administered 2014-01-12: 1000 mL via INTRAVENOUS

## 2014-01-12 MED ORDER — FLUOXETINE HCL 20 MG PO CAPS
20.0000 mg | ORAL_CAPSULE | Freq: Every morning | ORAL | Status: DC
  Administered 2014-01-13: 20 mg via ORAL
  Filled 2014-01-12: qty 1

## 2014-01-12 MED ORDER — HYDROXYZINE HCL 25 MG PO TABS: 25.0000 mg | ORAL_TABLET | Freq: Four times a day (QID) | ORAL | Status: DC | PRN

## 2014-01-12 MED ORDER — LORAZEPAM 1 MG PO TABS
0.0000 mg | ORAL_TABLET | Freq: Four times a day (QID) | ORAL | Status: DC
  Administered 2014-01-12: 2 mg via ORAL

## 2014-01-12 MED ORDER — IBUPROFEN 200 MG PO TABS: 600.0000 mg | ORAL_TABLET | Freq: Three times a day (TID) | ORAL | Status: DC | PRN

## 2014-01-12 MED ORDER — HYDROXYZINE PAMOATE 25 MG PO CAPS
25.0000 mg | ORAL_CAPSULE | Freq: Four times a day (QID) | ORAL | Status: DC | PRN
Start: 2014-01-12 — End: 2014-01-12
  Filled 2014-01-12: qty 1

## 2014-01-12 MED ORDER — CLONIDINE HCL 0.1 MG PO TABS: 0.1000 mg | ORAL_TABLET | Freq: Two times a day (BID) | ORAL | Status: DC

## 2014-01-12 MED ORDER — LURASIDONE HCL 40 MG PO TABS
40.0000 mg | ORAL_TABLET | Freq: Every morning | ORAL | Status: DC
  Administered 2014-01-13: 40 mg via ORAL
  Filled 2014-01-12 (×2): qty 1

## 2014-01-12 MED ORDER — LORAZEPAM 1 MG PO TABS
0.0000 mg | ORAL_TABLET | Freq: Two times a day (BID) | ORAL | Status: DC
  Filled 2014-01-12: qty 2

## 2014-01-12 MED ORDER — THIAMINE HCL 100 MG/ML IJ SOLN: 100.0000 mg | Freq: Every day | INTRAMUSCULAR | Status: DC

## 2014-01-12 MED ORDER — TRAZODONE HCL 100 MG PO TABS
100.0000 mg | ORAL_TABLET | Freq: Every day | ORAL | Status: DC
  Administered 2014-01-12: 100 mg via ORAL
  Filled 2014-01-12: qty 1

## 2014-01-12 MED ORDER — ALUM & MAG HYDROXIDE-SIMETH 200-200-20 MG/5ML PO SUSP: 30.0000 mL | ORAL | Status: DC | PRN

## 2014-01-12 MED ORDER — LORAZEPAM 2 MG/ML IJ SOLN: 0.0000 mg | Freq: Two times a day (BID) | INTRAMUSCULAR | Status: DC

## 2014-01-12 MED ORDER — DIVALPROEX SODIUM 500 MG PO DR TAB
1500.0000 mg | DELAYED_RELEASE_TABLET | Freq: Every day | ORAL | Status: DC
  Administered 2014-01-12: 1500 mg via ORAL
  Filled 2014-01-12: qty 3

## 2014-01-12 MED ORDER — ONDANSETRON HCL 4 MG/2ML IJ SOLN
4.0000 mg | Freq: Once | INTRAMUSCULAR | Status: AC
  Administered 2014-01-12: 4 mg via INTRAVENOUS

## 2014-01-12 NOTE — Progress Notes (Addendum)
Pts referral has been faxed to the following facilities that have been contacted regarding bed availability for inptx:  [facility]- per Tam[facility] can fax for review  [facility]- per [facility] beds available  [facility]- referral faxed  [facility]- per Cletus Gash can fax for review  [facility]- per Juliann Pulse can fax for review   The following facilities are at capacity:  [facility]- per Johnna Acosta- per [facility] Lower- per [facility]oll[facility] Pee- per [facility]- per [facility]- per [facility]ene[facility] Lever- per [facility]- per [facility]- per [facility] per [facility]- per S[facility]  [facility]- per Kasandra Knudsen only very very low acuity  [facility]- per [employer]- per [facility]- per [facility] MR beds only  [facility]- per Melody  [facility]- per Luis Abed Disposition MHT

## 2014-01-12 NOTE — ED Notes (Signed)
Pt reports he is SI, with plan to OD on pain pills, reports he has pain pills in his possession. Pt reports he is homeless and drank all day yesterday because a friend told him. Pt does not remember yesterday. Pt reports he is schizophrenic and stopped taking his medications. Pt has medications in his bag. Pt reports he hears voices even when on medications, pt reports voices just talk to him, they do not tell him to do things. Denies HI. Reports sore throat pain 8/10.

## 2014-01-12 NOTE — ED Provider Notes (Signed)
CSN: [id number]     Arrival date & time 01/12/14  1252 History   First MD Initiated Contact with Patient 01/12/14 1301     Chief Complaint  Patient presents with  . Medical Clearance     (Consider location/radiation/quality/duration/timing/severity/associated sxs/prior Treatment) HPI Pt presenting with c/o feeling suicidal- plan would be to OD on pain medication.  He states he has hx of schizophrenia and bipolar disorder and has been not taking his medication for several weeks. He also states he has been drinking more alcohol than usual and states he drank 2 cases of beer 2 days ago.  States he has also been taking pain pills.  Denies other drug use.  States he is having auditory hallucinations which were present before stopping his meds.  There are no other associated systemic symptoms, there are no other alleviating or modifying factors.  Denies recent illness.    Past Medical History  Diagnosis Date  . PTSD (post-traumatic stress disorder)   . Anxiety   . Depression   . Bipolar 1 disorder   . Schizophrenia   . Hepatitis C   . 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 Leg surgery     History reviewed. No pertinent family history. History  Substance Use Topics  . Smoking status: Current Every Day Smoker -- 1.00 packs/day    Types: Cigarettes  . Smokeless tobacco: Never Used  . Alcohol Use: Yes     Comment: 12-18 packs of beer/day    Review of Systems ROS reviewed and all otherwise negative except for mentioned in HPI    Allergies  Phenytoin sodium extended; Pineapple; and Risperdal  Home Medications   Prior to Admission medications   Medication Sig Start Date End Date Taking? Authorizing Provider  cloNIDine (CATAPRES) 0.1 MG tablet Take 0.1 mg by mouth 2 (two) times daily.   Yes Historical Provider, MD  divalproex (DEPAKOTE) 500 MG DR tablet Take 1,500 mg by mouth at bedtime.   Yes Historical Provider, MD  FLUoxetine  (PROZAC) 20 MG capsule Take 20 mg by mouth every morning.   Yes Historical Provider, MD  hydrOXYzine (VISTARIL) 25 MG capsule Take 25 mg by mouth 4 (four) times daily as needed for anxiety.    Yes Historical Provider, MD  lurasidone (LATUDA) 80 MG TABS Take 80 mg by mouth daily with breakfast.   Yes Historical Provider, MD  Lurasidone HCl (LATUDA) 20 MG TABS Take 2 tablets by mouth every morning. Take with the 80 mg tablet   Yes Historical Provider, MD  traZODone (DESYREL) 100 MG tablet Take 100 mg by mouth at bedtime.   Yes Historical Provider, MD   BP 95/68  Pulse 57  Temp(Src) 97.6 F (36.4 C) (Oral)  Resp 16  SpO2 95% Vitals reviewed Physical Exam Physical Examination: General appearance - alert, well appearing, and in no distress Mental status - alert, oriented to person, place, and time Eyes - no conjunctival injection, no scleral icterus Mouth - mucous membranes moist, pharynx normal without lesions Chest - clear to auscultation, no wheezes, rales or rhonchi, symmetric air entry Heart - normal rate, regular rhythm, normal S1, S2, no murmurs, rubs, clicks or gallops Abdomen - soft, nontender, nondistended, no masses or organomegaly Extremities - peripheral pulses normal, no pedal edema, no clubbing or cyanosis Skin - normal coloration and turgor, no rashes, no suspicious skin lesions noted Psych- flat affect, somewhat disheveled appearing, cooperative  ED  Course  Procedures (including critical care time) Labs Review Labs Reviewed  CBC - Abnormal; Notable for the following:    Hemoglobin 17.4 (*)    MCHC 36.2 (*)    All other components within normal limits  BASIC METABOLIC PANEL - Abnormal; Notable for the following:    CO2 17 (*)    Glucose, Bld 183 (*)    BUN 5 (*)    All other components within normal limits  URINE RAPID DRUG SCREEN (HOSP PERFORMED) - Abnormal; Notable for the following:    Tetrahydrocannabinol POSITIVE (*)    All other components within normal limits   HEPATIC FUNCTION PANEL - Abnormal; Notable for the following:    AST 60 (*)    All other components within normal limits  ETHANOL  LIPASE, BLOOD    Imaging Review Ct Abdomen Pelvis W Contrast  01/12/2014   CLINICAL DATA:  Left lower quadrant abdominal pain.  EXAM: CT ABDOMEN AND PELVIS WITH CONTRAST  TECHNIQUE: Multidetector CT imaging of the abdomen and pelvis was performed using the standard protocol following bolus administration of intravenous contrast.  CONTRAST:  62mL OMNIPAQUE IOHEXOL 300 MG/ML SOLN, 175mL OMNIPAQUE IOHEXOL 300 MG/ML SOLN  COMPARISON:  None.  FINDINGS: The liver, biliary tree, spleen, pancreas, adrenal glands, and kidneys are normal. The bowel is normal including the terminal ileum and appendix. Bladder is normal. No osseous abnormality.  IMPRESSION: Benign appearing abdomen.   Electronically Signed   By: Rozetta Nunnery M.D.   On: 01/12/2014 19:23     EKG Interpretation   Date/Time:  Saturday Jan 12 2014 13:11:11 EDT Ventricular Rate:  88 PR Interval:  119 QRS Duration: 90 QT Interval:  385 QTC Calculation: 466 R Axis:   76 Text Interpretation:  Sinus rhythm Borderline short PR interval LAE,  consider biatrial enlargement No significant change since last tracing  Confirmed by [facility]  MD, Sheldon Sem [phone]) on 01/12/2014 1:52:27 PM      MDM   Final diagnoses:  Schizoaffective disorder  Psychosis  Substance dependence    Pt with schizophrenia, bipolar presenting due to SI, plan to OD.  Has not been taking meds.  Has been abusing alcohol.  Pt medically cleared, psych holding orders written, placed on ciwa protocol as well.  TTS consulted, dispo per their evaluation.      Threasa Beards, MD 01/13/14 [id number]
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
## 2014-01-12 NOTE — ED Notes (Signed)
Patient given dinner tray.

## 2014-01-12 NOTE — ED Notes (Signed)
Report given to psych ED.
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
## 2014-01-12 NOTE — ED Notes (Signed)
Dr Harrison at bedside

## 2014-01-12 NOTE — ED Notes (Signed)
Patient belongings taken home by patient mother.

## 2014-01-12 NOTE — ED Notes (Addendum)
Patient continues to have vomiting.  Reports he is nauseated and vomits everyday.  Also reporting left sided abdominal pain.  MD notified.

## 2014-01-12 NOTE — ED Notes (Signed)
He called EMS re:  "had a binge (alcohol) this Thursday"--hasn't felt right since.  Today he is vomiting.  He further states he "stopped taking all of my psych. meds two days ago".  He also states "I want to hurt myself with alcohol".  He smells of acrid chemicals (?meth vs. Paint?) and is acting bizarrely.

## 2014-01-12 NOTE — ED Notes (Signed)
Attempted to call report to psych ED, no answer. Walked to psych ED, states they are unable to take report at this time as they are getting a patient from [facility]

## 2014-01-12 NOTE — ED Notes (Signed)
Patient sleeping

## 2014-01-12 NOTE — ED Notes (Signed)
Dr Aline Brochure at bedside. Patient ok to move to psych ED

## 2014-01-12 NOTE — ED Provider Notes (Signed)
8:39 PM I was called back by nursing to evaluate the patient do to vomiting and abdominal pain. The symptoms appear to be chronic. He does have left lower quadrant tenderness palpation on exam. Will add on lipase, hepatic function panel, and CT of abdomen.  Labs and imaging were noncontributory.  Blanchard Kelch, MD 01/12/14 [phone]

## 2014-01-13 ENCOUNTER — Inpatient Hospital Stay (HOSPITAL_COMMUNITY)
Admission: AD | Admit: 2014-01-13 | Discharge: 2014-01-22 | DRG: 885 | Disposition: A | Discharge status: Home / Self Care | Payer: Federal, State, Local not specified - Other | Source: Intra-hospital | Attending: Psychiatry | Admitting: Psychiatry

## 2014-01-13 ENCOUNTER — Encounter (HOSPITAL_COMMUNITY): Payer: Self-pay

## 2014-01-13 DIAGNOSIS — F259 Schizoaffective disorder, unspecified: Principal | ICD-10-CM | POA: Diagnosis present

## 2014-01-13 DIAGNOSIS — R45851 Suicidal ideations: Secondary | ICD-10-CM

## 2014-01-13 DIAGNOSIS — K219 Gastro-esophageal reflux disease without esophagitis: Secondary | ICD-10-CM | POA: Diagnosis present

## 2014-01-13 DIAGNOSIS — F1994 Other psychoactive substance use, unspecified with psychoactive substance-induced mood disorder: Secondary | ICD-10-CM | POA: Diagnosis present

## 2014-01-13 DIAGNOSIS — F1920 Other psychoactive substance dependence, uncomplicated: Secondary | ICD-10-CM

## 2014-01-13 DIAGNOSIS — F250 Schizoaffective disorder, bipolar type: Secondary | ICD-10-CM | POA: Diagnosis present

## 2014-01-13 DIAGNOSIS — F29 Unspecified psychosis not due to a substance or known physiological condition: Secondary | ICD-10-CM

## 2014-01-13 DIAGNOSIS — F1020 Alcohol dependence, uncomplicated: Secondary | ICD-10-CM | POA: Diagnosis present

## 2014-01-13 DIAGNOSIS — F319 Bipolar disorder, unspecified: Secondary | ICD-10-CM | POA: Diagnosis present

## 2014-01-13 DIAGNOSIS — F4310 Post-traumatic stress disorder, unspecified: Secondary | ICD-10-CM | POA: Diagnosis present

## 2014-01-13 DIAGNOSIS — F411 Generalized anxiety disorder: Secondary | ICD-10-CM | POA: Diagnosis present

## 2014-01-13 DIAGNOSIS — B1920 Unspecified viral hepatitis C without hepatic coma: Secondary | ICD-10-CM | POA: Diagnosis present

## 2014-01-13 DIAGNOSIS — F17200 Nicotine dependence, unspecified, uncomplicated: Secondary | ICD-10-CM | POA: Diagnosis present

## 2014-01-13 DIAGNOSIS — F1210 Cannabis abuse, uncomplicated: Secondary | ICD-10-CM | POA: Diagnosis present

## 2014-01-13 MED ORDER — ALUM & MAG HYDROXIDE-SIMETH 200-200-20 MG/5ML PO SUSP
30.0000 mL | ORAL | Status: DC | PRN
  Administered 2014-01-18: 30 mL via ORAL

## 2014-01-13 MED ORDER — LORAZEPAM 1 MG PO TABS
0.0000 mg | ORAL_TABLET | Freq: Four times a day (QID) | ORAL | Status: AC
  Administered 2014-01-14 – 2014-01-15 (×5): 1 mg via ORAL
  Filled 2014-01-13 (×5): qty 1

## 2014-01-13 MED ORDER — ACETAMINOPHEN 325 MG PO TABS: 650.0000 mg | ORAL_TABLET | Freq: Four times a day (QID) | ORAL | Status: DC | PRN

## 2014-01-13 MED ORDER — LORAZEPAM 1 MG PO TABS
1.0000 mg | ORAL_TABLET | Freq: Three times a day (TID) | ORAL | Status: DC | PRN
  Administered 2014-01-13 – 2014-01-22 (×7): 1 mg via ORAL
  Filled 2014-01-13 (×6): qty 1

## 2014-01-13 MED ORDER — NICOTINE 21 MG/24HR TD PT24
21.0000 mg | MEDICATED_PATCH | Freq: Every day | TRANSDERMAL | Status: DC
  Administered 2014-01-13 – 2014-01-22 (×10): 21 mg via TRANSDERMAL
  Filled 2014-01-13 (×11): qty 1

## 2014-01-13 MED ORDER — LORAZEPAM 1 MG PO TABS
0.0000 mg | ORAL_TABLET | Freq: Two times a day (BID) | ORAL | Status: DC
  Administered 2014-01-14 – 2014-01-15 (×2): 1 mg via ORAL
  Administered 2014-01-16: 2 mg via ORAL
  Administered 2014-01-17 – 2014-01-21 (×5): 1 mg via ORAL
  Filled 2014-01-13 (×5): qty 1
  Filled 2014-01-13: qty 2
  Filled 2014-01-13 (×4): qty 1

## 2014-01-13 MED ORDER — LORAZEPAM 2 MG/ML IJ SOLN: 0.0000 mg | Freq: Two times a day (BID) | INTRAMUSCULAR | Status: DC

## 2014-01-13 MED ORDER — HYDROXYZINE HCL 25 MG PO TABS
25.0000 mg | ORAL_TABLET | Freq: Four times a day (QID) | ORAL | Status: DC | PRN
  Administered 2014-01-17 – 2014-01-18 (×2): 25 mg via ORAL
  Filled 2014-01-13 (×2): qty 1

## 2014-01-13 MED ORDER — LURASIDONE HCL 40 MG PO TABS
40.0000 mg | ORAL_TABLET | Freq: Every morning | ORAL | Status: DC
  Filled 2014-01-13 (×2): qty 1

## 2014-01-13 MED ORDER — TRAZODONE HCL 100 MG PO TABS
100.0000 mg | ORAL_TABLET | Freq: Every day | ORAL | Status: DC
  Administered 2014-01-13 – 2014-01-21 (×9): 100 mg via ORAL
  Filled 2014-01-13 (×11): qty 1

## 2014-01-13 MED ORDER — IBUPROFEN 600 MG PO TABS: 600.0000 mg | ORAL_TABLET | Freq: Three times a day (TID) | ORAL | Status: DC | PRN

## 2014-01-13 MED ORDER — CLONIDINE HCL 0.1 MG PO TABS
0.1000 mg | ORAL_TABLET | Freq: Two times a day (BID) | ORAL | Status: DC
  Administered 2014-01-13 – 2014-01-15 (×4): 0.1 mg via ORAL
  Filled 2014-01-13 (×7): qty 1

## 2014-01-13 MED ORDER — LORAZEPAM 2 MG/ML IJ SOLN: 0.0000 mg | Freq: Four times a day (QID) | INTRAMUSCULAR | Status: DC

## 2014-01-13 MED ORDER — LURASIDONE HCL 80 MG PO TABS
80.0000 mg | ORAL_TABLET | Freq: Every day | ORAL | Status: DC
  Filled 2014-01-13 (×2): qty 1

## 2014-01-13 MED ORDER — NICOTINE 21 MG/24HR TD PT24
MEDICATED_PATCH | TRANSDERMAL | Status: AC
  Administered 2014-01-13: 21 mg
  Filled 2014-01-13: qty 1

## 2014-01-13 MED ORDER — VITAMIN B-1 100 MG PO TABS
100.0000 mg | ORAL_TABLET | Freq: Every day | ORAL | Status: DC
  Administered 2014-01-14 – 2014-01-22 (×8): 100 mg via ORAL
  Filled 2014-01-13 (×11): qty 1

## 2014-01-13 MED ORDER — ONDANSETRON HCL 4 MG PO TABS
4.0000 mg | ORAL_TABLET | Freq: Three times a day (TID) | ORAL | Status: DC | PRN
  Administered 2014-01-15 – 2014-01-21 (×4): 4 mg via ORAL
  Filled 2014-01-13 (×4): qty 1

## 2014-01-13 MED ORDER — THIAMINE HCL 100 MG/ML IJ SOLN: 100.0000 mg | Freq: Every day | INTRAMUSCULAR | Status: DC

## 2014-01-13 MED ORDER — DIVALPROEX SODIUM 500 MG PO DR TAB
1500.0000 mg | DELAYED_RELEASE_TABLET | Freq: Every day | ORAL | Status: DC
  Administered 2014-01-13 – 2014-01-21 (×9): 1500 mg via ORAL
  Filled 2014-01-13 (×13): qty 3

## 2014-01-13 MED ORDER — MAGNESIUM HYDROXIDE 400 MG/5ML PO SUSP
30.0000 mL | Freq: Every day | ORAL | Status: DC | PRN
Start: 2014-01-13 — End: 2014-01-22
  Administered 2014-01-16: 30 mL via ORAL

## 2014-01-13 MED ORDER — FLUOXETINE HCL 20 MG PO CAPS
20.0000 mg | ORAL_CAPSULE | Freq: Every morning | ORAL | Status: DC
  Administered 2014-01-14 – 2014-01-22 (×9): 20 mg via ORAL
  Filled 2014-01-13 (×11): qty 1

## 2014-01-13 NOTE — Progress Notes (Signed)
MHT contacted the following facilities for inpatient placement:  1)[facility]-faxed referral 2)[facility]-no beds 3)[facility]-faxed referral 4)[facility]-no beds 5)[facility]-no beds 6)[facility]-refaxed referral, Kennyth Lose, RN unable to find 7)[facility]-faxed referral 8)[facility]-no beds  Follow up:  1)[facility]-declined 2)[facility]-no beds 3)[facility]-to review in AM with MD 4)[facility]-to review in AM with MD  Wyvonnia Dusky, MHT/NS

## 2014-01-13 NOTE — Progress Notes (Signed)
Brian 1987  SUBJECTIVE: Patient was seen this am for his alcohol  Use problem and non compliant with his Bipolar medications.  Patient denied suicide this am but stated he was under the influence of alcohol when he endorsed suicide last night.  Patient stated he has been drinking a lot , 2 cases of beer weekly for a long time.  He reported not taking his medications and that he drinks until he blacks out.  He also reported alcohol withdrawal symptoms but could not explain further when and where the seizure occurred.   We have accepted patient for admission pending bed availability.  He denies SI/HI/AVH today but is asking for assistance to get back to his medication.   Mental Status Examination Psychiatric Specialty Exam: Physical Exam  ROS  Blood pressure 125/87, pulse 81, temperature 97.7 F (36.5 C), temperature source Oral, resp. rate 18, SpO2 96.00%.There is no weight on file to calculate BMI.  General Appearance: Casual and Disheveled  Eye Contact::  Good  Speech:  Clear and Coherent and Normal Rate  Volume:  Normal  Mood:  Anxious and Depressed  Affect:  Congruent  Thought Process:  Coherent and Goal Directed  Orientation:  Full (Time, Place, and Person)  Thought Content:  WDL  Suicidal Thoughts:  No  Homicidal Thoughts:  No  Memory:  Immediate;   Good Recent;   Fair Remote;   Fair  Judgement:  Poor  Insight:  Fair  Psychomotor Activity:  Tremor  Concentration:  Good  Recall:  NA  Akathisia:  NA  Handed:  Right  AIMS (if indicated):     Assets:  Desire for Improvement  Sleep:        Current MedicationCurrent facility-administered medications:alum & mag hydroxide-simeth (MAALOX/MYLANTA) 200-200-20 MG/5ML suspension 30 mL, 30 mL, Oral, PRN, Threasa Beards, MD;  cloNIDine (CATAPRES) tablet 0.1 mg, 0.1 mg, Oral, BID, Threasa Beards, MD, 0.1 mg at 01/12/14 2129;  divalproex (DEPAKOTE) DR tablet 1,500 mg, 1,500 mg, Oral, QHS, Threasa Beards, MD, 1,500 mg  at 01/12/14 2133 FLUoxetine (PROZAC) capsule 20 mg, 20 mg, Oral, q morning - 10a, Threasa Beards, MD, 20 mg at 01/13/14 1129;  hydrOXYzine (ATARAX/VISTARIL) tablet 25 mg, 25 mg, Oral, Q6H PRN, Waylan Boga, NP;  ibuprofen (ADVIL,MOTRIN) tablet 600 mg, 600 mg, Oral, Q8H PRN, Threasa Beards, MD;  LORazepam (ATIVAN) injection 0-4 mg, 0-4 mg, Intravenous, 4 times per day, Threasa Beards, MD, 1 mg at 01/12/14 1756 LORazepam (ATIVAN) injection 0-4 mg, 0-4 mg, Intravenous, Q12H, Threasa Beards, MD;  LORazepam (ATIVAN) tablet 0-4 mg, 0-4 mg, Oral, 4 times per day, Threasa Beards, MD, 2 mg at 01/12/14 1616;  LORazepam (ATIVAN) tablet 0-4 mg, 0-4 mg, Oral, Q12H, Threasa Beards, MD;  LORazepam (ATIVAN) tablet 1 mg, 1 mg, Oral, Q8H PRN, Threasa Beards, MD lurasidone (LATUDA) tablet 40 mg, 40 mg, Oral, q morning - 10a, Threasa Beards, MD, 40 mg at 01/13/14 0851;  lurasidone (LATUDA) tablet 80 mg, 80 mg, Oral, Q breakfast, Threasa Beards, MD, 80 mg at 01/13/14 6644;  nicotine (NICODERM CQ - dosed in mg/24 hours) patch 21 mg, 21 mg, Transdermal, Daily, Threasa Beards, MD, 21 mg at 01/13/14 1129;  ondansetron (ZOFRAN) tablet 4 mg, 4 mg, Oral, Q8H PRN, Threasa Beards, MD, 4 mg at 01/12/14 1339 thiamine (B-1) injection 100 mg, 100 mg, Intravenous, Daily, Threasa Beards, MD;  thiamine (VITAMIN B-1) tablet 100 mg, 100 mg, Oral, Daily, Jana Half  Magdalene Patricia, MD, 100 mg at 01/13/14 1128;  traZODone (DESYREL) tablet 100 mg, 100 mg, Oral, QHS, Threasa Beards, MD, 100 mg at 01/12/14 2133;  zolpidem (AMBIEN) tablet 5 mg, 5 mg, Oral, QHS PRN, Threasa Beards, MD Current outpatient prescriptions:cloNIDine (CATAPRES) 0.1 MG tablet, Take 0.1 mg by mouth 2 (two) times daily., Disp: , Rfl: ;  divalproex (DEPAKOTE) 500 MG DR tablet, Take 1,500 mg by mouth at bedtime., Disp: , Rfl: ;  FLUoxetine (PROZAC) 20 MG capsule, Take 20 mg by mouth every morning., Disp: , Rfl: ;  hydrOXYzine (VISTARIL) 25 MG capsule, Take 25 mg by mouth 4 (four)  times daily as needed for anxiety. , Disp: , Rfl:  lurasidone (LATUDA) 80 MG TABS, Take 80 mg by mouth daily with breakfast., Disp: , Rfl: ;  Lurasidone HCl (LATUDA) 20 MG TABS, Take 2 tablets by mouth every morning. Take with the 80 mg tablet, Disp: , Rfl: ;  traZODone (DESYREL) 100 MG tablet, Take 100 mg by mouth at bedtime., Disp: , Rfl:    Assessment Axis I  Schizoaffective d/o, Substance abuse Axis II  Deferred Axis III  PTSD (post-traumatic stress disorder)   Anxiety   Depression   Bipolar 1 disorder  Schizophrenia  Hepatitis C   Cancer  Axis IV economic problems, housing problems, occupational problems, problems related to social environment and problems with primary support group Axis V 21-30 behavior considerably influenced by delusions or hallucinations OR serious impairment in judgment, communication OR inability to function in almost all areas  Plan We will continue seeking for available for his admission for safety and stabilization We will continue to offer him Ativan per our Alcohol detox protocol We will continue to offer him Depakote 1500 mg po qhs for his mood We will continue to offer him Prozac 20 mg po daily  We will continue to offer him Vistaril 25  Mg po every 4 hours as needed for anxiety We will continue to offer him Latuda 120 mg po daily with breakfast  He will continue to take the rest of his medications for his other medical condition.  Charmaine Downs   PMHNP-BC  Patient was seen face-to-face for this evaluation, case discussed with a physician extender and developed treatment plans. Reviewed the information documented and agree with the treatment plan.  Parke Simmers Yaseen Gilberg 01/13/2014 5:02 PM

## 2014-01-13 NOTE — ED Notes (Signed)
[facility] here to transport to [facility]. No belongings-all were taken home by family. Ambulatory without difficulty. No complaints voiced. Denies pain.

## 2014-01-13 NOTE — ED Notes (Signed)
Attempted to call report-nurses are still giving out meds.

## 2014-01-13 NOTE — ED Notes (Signed)
Having a shower.

## 2014-01-13 NOTE — Progress Notes (Signed)
Per Charmaine Downs, NP, patient to be accepted to [facility] 500 hall unit when bed available. Expected discharges today at [facility] with pending transfer once bed becomes available. Clayborne Dana, RN

## 2014-01-13 NOTE — Progress Notes (Signed)
Patient admitted voluntarily d/t SI to overdose on alcohol. He reports that he drank 2 cases of beer and smoked THC on Thursday. He reports that he blacked out on Friday. He reports his stressor as living with his mother and being unemployed. He reports that he is used to being independent. He reports using heroin and meth 3 yrs ago. He has passive si and verbally contracts for safety, he reports auditory and visual hallucinations, he states that he hears noises that sounds like someone knocking at the door and he is constantly checking the door at home. He reports visual hallucinations of hangings and dreams about bodies and demons. He reports being off his medications for about 1 week. 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  He was cooperative during admission and was oriented to unit, snacks given and medicated as ordered. Safety maintained on unit with 15 min checks.

## 2014-01-13 NOTE — ED Notes (Signed)
Attempted to call report-stated nurses were giving out meds and would call me back.

## 2014-01-14 DIAGNOSIS — F1910 Other psychoactive substance abuse, uncomplicated: Secondary | ICD-10-CM

## 2014-01-14 DIAGNOSIS — R45851 Suicidal ideations: Secondary | ICD-10-CM

## 2014-01-14 DIAGNOSIS — F259 Schizoaffective disorder, unspecified: Principal | ICD-10-CM

## 2014-01-14 MED ORDER — LURASIDONE HCL 40 MG PO TABS
120.0000 mg | ORAL_TABLET | Freq: Every day | ORAL | Status: DC
  Administered 2014-01-14 – 2014-01-15 (×2): 120 mg via ORAL
  Filled 2014-01-14 (×3): qty 3

## 2014-01-14 NOTE — Progress Notes (Signed)
Adult Psychoeducational Group Note  Date:  01/14/2014 Time:  9:26 PM  Group Topic/Focus:  Wrap-Up Group:   The focus of this group is to help patients review their daily goal of treatment and discuss progress on daily workbooks.  Participation Level:  Did Not Attend   Additional Comments:  Pt didn't attend group. Was in bed, pt stated that he did not feel well  Athleen Feltner 01/14/2014, 9:26 PM
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
## 2014-01-14 NOTE — Progress Notes (Addendum)
1830  Patient vomited after dinner.  Patient resting in bed, given ginger ale.  Stated he was feeling better.  Patient continues to rest in bed with eyes closed.  Will continue to monitor patient.  Declined medication.

## 2014-01-14 NOTE — Progress Notes (Signed)
NUTRITION ASSESSMENT  Pt identified as at risk on the Malnutrition Screen Tool  INTERVENTION: 1. Educated patient on the importance of nutrition and encouraged intake of food and beverages.  Provided handout on sobriety nutrition. 2. Discussed weight goals. 3. Supplements: Thiamine daily  NUTRITION DIAGNOSIS: Unintentional weight loss related to sub-optimal intake as evidenced by pt report.   Goal: Pt to meet >/= 90% of their estimated nutrition needs.  Monitor:  PO intake  Assessment:  Patient admitted with SI, Auditory and visual hallucinations.  ETOH and THC abuse.  Hx includes bipolar disorder, schizoaffective disorder and PTSD.  Patient reports eating small amounts usually and eating OK currently with fair appetite.  Poor po prior to admit for a "long time" secondary to etoh abuse.  21 lb weight loss in the past 4 1/2 months per e-chart.  Patient reports  28 y.o. male  Height: Ht Readings from Last 1 Encounters:  01/13/14 5\' 11"  (1.803 m)    Weight: Wt Readings from Last 1 Encounters:  01/13/14 240 lb (108.863 kg)    Weight Hx: Wt Readings from Last 10 Encounters:  01/13/14 240 lb (108.863 kg)  08/23/13 261 lb (118.389 kg)  06/29/11 193 lb (87.544 kg)    BMI:  Body mass index is 33.49 kg/(m^2). Pt meets criteria for obesity grade 1 based on current BMI.  Estimated Nutritional Needs: Kcal: 25-30 kcal/kg Protein: > 1 gram protein/kg Fluid: 1 ml/kcal  Diet Order: General Pt is also offered choice of unit snacks mid-morning and mid-afternoon.  Pt is eating as desired.   Lab results and medications reviewed.   Antonieta Iba, RD, LDN Clinical Inpatient Dietitian Pager:  [phone] Weekend and after hours pager:  [phone]
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
## 2014-01-14 NOTE — Progress Notes (Signed)
Patient ID: Brian [name], male   DOB: Aug 05, 1986, 28 y.o.   MRN: [id number] Patient reports he slept fair and his appetite is good.  His energy leel is low and his abikity to pay attention is poor.  He is rating depression at 7/10 and hopelessness at 4/10.  He is having some agitation and some blurred vision.  A- Supported patient.  R-Patient says he is still hearing noises abut he is not seeing things,  Says he will inform staff if noises get worse.  He also says when he goes home he will continue to take meds.

## 2014-01-14 NOTE — Clinical Social Work Note (Signed)
CSW met with pt individually. Pt not interested in SA IOP or therapy. Pt reports that he plans to continue drinking and using marijuana. Pt stated that his goal is to get meds adjusted to decrease/eliminate AVH. Limited insight-unable to process how drinking/drug use contribute to mental health problems. Pt reports that he wants to follow up at [facility] for med management only. (Also, quick disclosure completed on pt in error for [facility]-no information was sent to this facility).   [place], [place] 01/14/2014 11:08 AM
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
## 2014-01-15 DIAGNOSIS — F1994 Other psychoactive substance use, unspecified with psychoactive substance-induced mood disorder: Secondary | ICD-10-CM

## 2014-01-15 MED ORDER — CLONIDINE HCL 0.1 MG PO TABS: 0.1000 mg | ORAL_TABLET | Freq: Two times a day (BID) | ORAL | Status: DC | PRN

## 2014-01-15 MED ORDER — LURASIDONE HCL 80 MG PO TABS
80.0000 mg | ORAL_TABLET | Freq: Every day | ORAL | Status: DC
  Administered 2014-01-16 – 2014-01-17 (×2): 80 mg via ORAL
  Filled 2014-01-15 (×4): qty 1

## 2014-01-15 NOTE — Progress Notes (Signed)
D:  Pt said he was +ve HI/AH. Pt said he was hearing voices telling him to kill a nurse. Pt stated he would contract for safety and let someone  Know before he acts on the urges. Pt denies SI/VH. Pt stayed in bed most of the night, pt didn't go to group, pt only got up to get medications.   A: Pt was offered support and encouragement. Pt was given scheduled medications. Pt was encourage to attend groups. Q 15 minute checks were done for safety.   R: Pt is taking medication. Pt has no complaints.Pt receptive to treatment and safety maintained on unit.

## 2014-01-15 NOTE — Progress Notes (Signed)
Recreation Therapy Notes  Animal-Assisted Activity/Therapy (AAA/T) Program Checklist/Progress Notes Patient Eligibility Criteria Checklist & Daily Group note for Rec Tx Intervention  Date: 05.26.2015 Time: 2:45pm Location: [address] Dayroom    AAA/T Program Assumption of Risk Form signed by Patient/ or Parent Legal Guardian yes  Patient is free of allergies or sever asthma yes  Patient reports no fear of animals yes  Patient reports no history of cruelty to animals yes   Patient understands his/her participation is voluntary yes  Patient washes hands before animal contact yes  Patient washes hands after animal contact yes  Behavioral Response: Appropriate.  Education: Hand Washing, Appropriate Animal Interaction   Education Outcome: Acknowledges understanding  Clinical Observations/Feedback: Patient interacted appropriately in AAA session, petting therapy dog appropriately and interaction with peers appropriately.   Brian [name] L Brian [name], LRT/CTRS  Brian [name] L Brian [name] 01/15/2014 4:47 PM 

## 2014-01-15 NOTE — Progress Notes (Signed)
Adult Psychoeducational Group Note  Date:  01/15/2014 Time:  9:13 PM  Group Topic/Focus:  Wrap-Up Group:   The focus of this group is to help patients review their daily goal of treatment and discuss progress on daily workbooks.  Participation Level:  Did Not Attend  Additional Comments:  Pt did not attend group.  Milus Glazier 01/15/2014, 9:13 PM

## 2014-01-15 NOTE — Progress Notes (Signed)
[facility] MD Progress Note  01/15/2014 1:42 PM Brian [name]  MRN:  [id number] Subjective:  Patient is seen face to face for this evaluation. He was diagnosed with schizoaffective disorder and substance dependence. He was admitted from [facility] ED for crisis stabilization, safety monitoring and medication management. He reports "I tried to commit suicide with alcohol and drugs". Patient has been physically feeling sick with nausea and vomiting and also reports headache. His stressors are being unemployed, and living at his mom's house. Patient staff RN reported that he has vomited after lunch today.   His last drinking alcohol 2 days ago, drinking 2x 40 oz per day, for the last 2-3 years. He is drinking 40 beers 2 days ago, and smoked marijuana 2 days ago as well (which he uses once a month). He denies h/o withdrawal seizures or DTs. His mood has been "down for a long time". Pt reports continued SI, with plan to OD on heroin. Pt denies HI/AVH. His last used heroin and methamphetamine 3 years ago (which he used daily prior to that). He was previously admitted to [facility] 2 years ago, for +SI/HI and talking to himself. He was started on psychotropic meds 2 year ago, but he feels the meds are ineffective. Pt stopped taking his meds 1 week ago. Pt's psychiatrist is at [facility]. He reports attempting suicide 3 times in the past, and last attempt was 2 years ago (OD on pain pills).  Diagnosis:   DSM5: Schizophrenia Disorders:  Schizophrenia (295.7) Obsessive-Compulsive Disorders:   Trauma-Stressor Disorders:   Substance/Addictive Disorders:  Alcohol Related Disorder - Severe (303.90), Cannabis Use Disorder - Severe (304.30) and Opioid Disorder - Severe (304.00) Depressive Disorders:   Total Time spent with patient: 30 minutes  Axis I: Schizoaffective Disorder, Substance Abuse and Substance Induced Mood Disorder  ADL's:  Impaired  Sleep: Fair  Appetite:  Poor  Suicidal Ideation:  Patient  has suicidal ideation and contract for safety while in hospital Homicidal Ideation:  denied AEB (as evidenced by):  Psychiatric Specialty Exam: Physical Exam  ROS  Blood pressure 114/77, pulse 66, temperature 97.9 F (36.6 C), temperature source Oral, resp. rate 20, height 5\' 11"  (1.803 m), weight 108.863 kg (240 lb), SpO2 99.00%.Body mass index is 33.49 kg/(m^2).  General Appearance: Guarded  Eye Contact::  Fair  Speech:  Clear and Coherent  Volume:  Decreased  Mood:  Anxious and Depressed  Affect:  Congruent and Depressed  Thought Process:  Coherent and Goal Directed  Orientation:  Full (Time, Place, and Person)  Thought Content:  Obsessions and Rumination  Suicidal Thoughts:  Yes.  with intent/plan  Homicidal Thoughts:  No  Memory:  Immediate;   Fair  Judgement:  Intact  Insight:  Fair  Psychomotor Activity:  Psychomotor Retardation  Concentration:  Fair  Recall:  [employer] of Knowledge:Fair  Language: Fair  Akathisia:  NA  Handed:  Right  AIMS (if indicated):     Assets:  Communication Skills Desire for Improvement Financial Resources/Insurance Housing Leisure Time Physical Health Resilience Social Support  Sleep:  Number of Hours: 6.75   Musculoskeletal: Strength & Muscle Tone: within normal limits Gait & Station: normal Patient leans: N/A  Current Medications: Current Facility-Administered Medications  Medication Dose Route Frequency Provider Last Rate Last Dose  . acetaminophen (TYLENOL) tablet 650 mg  650 mg Oral Q6H PRN Delfin Gant, NP      . alum & mag hydroxide-simeth (MAALOX/MYLANTA) 200-200-20 MG/5ML suspension 30 mL  30 mL  Oral PRN Delfin Gant, NP      . cloNIDine (CATAPRES) tablet 0.1 mg  0.1 mg Oral BID Delfin Gant, NP   0.1 mg at 01/15/14 1205  . divalproex (DEPAKOTE) DR tablet 1,500 mg  1,500 mg Oral QHS Delfin Gant, NP   1,500 mg at 01/14/14 2158  . FLUoxetine (PROZAC) capsule 20 mg  20 mg Oral q morning - 10a  Delfin Gant, NP   20 mg at 01/15/14 1210  . hydrOXYzine (ATARAX/VISTARIL) tablet 25 mg  25 mg Oral Q6H PRN Delfin Gant, NP      . ibuprofen (ADVIL,MOTRIN) tablet 600 mg  600 mg Oral Q8H PRN Delfin Gant, NP      . LORazepam (ATIVAN) tablet 0-4 mg  0-4 mg Oral 4 times per day Delfin Gant, NP   1 mg at 01/15/14 1214  . LORazepam (ATIVAN) tablet 0-4 mg  0-4 mg Oral Q12H Delfin Gant, NP   1 mg at 01/14/14 1625  . LORazepam (ATIVAN) tablet 1 mg  1 mg Oral Q8H PRN Delfin Gant, NP   1 mg at 01/13/14 2048  . lurasidone (LATUDA) tablet 120 mg  120 mg Oral Q breakfast Durward Parcel, MD   120 mg at 01/15/14 1210  . magnesium hydroxide (MILK OF MAGNESIA) suspension 30 mL  30 mL Oral Daily PRN Delfin Gant, NP      . nicotine (NICODERM CQ - dosed in mg/24 hours) patch 21 mg  21 mg Transdermal Daily Delfin Gant, NP   21 mg at 01/15/14 1210  . ondansetron (ZOFRAN) tablet 4 mg  4 mg Oral Q8H PRN Delfin Gant, NP      . thiamine (VITAMIN B-1) tablet 100 mg  100 mg Oral Daily Delfin Gant, NP   100 mg at 01/15/14 1210  . traZODone (DESYREL) tablet 100 mg  100 mg Oral QHS Delfin Gant, NP   100 mg at 01/14/14 2158    Lab Results: No results found for this or any previous visit (from the past 48 hour(s)).  Physical Findings: AIMS: Facial and Oral Movements Muscles of Facial Expression: None, normal Lips and Perioral Area: None, normal Jaw: None, normal Tongue: None, normal,Extremity Movements Upper (arms, wrists, hands, fingers): None, normal Lower (legs, knees, ankles, toes): None, normal, Trunk Movements Neck, shoulders, hips: None, normal, Overall Severity Severity of abnormal movements (highest score from questions above): None, normal Incapacitation due to abnormal movements: None, normal Patient's awareness of abnormal movements (rate only patient's report): No Awareness, Dental Status Current problems with teeth and/or  dentures?: No Does patient usually wear dentures?: No  CIWA:  CIWA-Ar Total: 1 COWS:     Treatment Plan Summary: Daily contact with patient to assess and evaluate symptoms and progress in treatment Medication management  Plan: Treatment Plan/Recommendations:   1. Admit for crisis management and stabilization. 2. Medication management to reduce current symptoms to base line and improve the patient's overall level of functioning. Monitor for adverse affects and provide Zofran PRN for vomiting.  3. Treat health problems as indicated. 4. Develop treatment plan to decrease risk of relapse upon discharge and to reduce the need for readmission. 5. Psycho-social education regarding relapse prevention and self care. 6. Health care follow up as needed for medical problems. 7. Restart home medications where appropriate.   Medical Decision Making Problem Points:  Established problem, worsening (2), New problem, with no additional work-up planned (3), Review of  last therapy session (1) and Review of psycho-social stressors (1) Data Points:  Review or order clinical lab tests (1) Review or order medicine tests (1) Review of medication regiment & side effects (2) Review of new medications or change in dosage (2)  I certify that inpatient services furnished can reasonably be expected to improve the patient's condition.   Parke Simmers Desiree Fleming 01/15/2014, 1:42 PM

## 2014-01-15 NOTE — Progress Notes (Signed)
D:  Patient's self inventory sheet, patient has fair sleep, poor appetite, normal energy level, improving attention span.  Rated depression 9, hopeless 8.  Still experiencing cravings.  Denied SI.  Rash and pain .  Worst pain #6.   After discharge, will not drink, have better medicine.  Does have discharge plans.  Does not want to drink after discharge and forget his medications. A:  Medications administered per MD instructions.  Emotional support and encouragement given patient. R:  Denied SI and HI with nurse.  Contracts for safety.  Denied A/V hallucinations.  Denied pain.  Will continue to monitor patient for safety with 15 minute checks.  Safety maintained. Patient vomited after lunch, MD informed.  Patient slept afterwards, then zofran given when patient woke. Will continue to monitor patient for safety.  Safety maintained.

## 2014-01-15 NOTE — Progress Notes (Signed)
The focus of this group is to educate the patient on the purpose and policies of crisis stabilization and provide a format to answer questions about their admission.  The group details unit policies and expectations of patients while admitted.  Patient did not attend 0900 nurse education orientation group this morning.  Patient stayed in bed sleeping.    

## 2014-01-15 NOTE — BHH Group Notes (Signed)
[facility] LCSW Group Therapy  Feelings Around Diagnosis 1:15 - 2:30 PM  01/15/2014 2:35 PM  Type of Therapy:  Group Therapy  Participation Level:  Did Not Attend

## 2014-01-15 NOTE — Progress Notes (Signed)
D: Pt is blunted in affect and depressed in mood. Pt was isolated to his room this evening. Pt endorses SI and HI. Pt refused to elaborate on any specific plan or target. He asked to be left alone. Pt did agree to verbalize to staff on whether he was positive or negative for SI/HI. He reports having sweats in reference to detoxing. Pt voiced no concerns he wished for this writer to address at this time. A: Writer administered scheduled medications to pt. Continued support and availability as needed was extended to this pt. Staff continue to monitor pt with q37min checks.  R: No adverse drug reactions noted. Pt receptive to treatment. Pt remains safe at this time.

## 2014-01-16 LAB — COMPREHENSIVE METABOLIC PANEL
ALT: 24 U/L (ref 0–53)
AST: 23 U/L (ref 0–37)
Albumin: 3.7 g/dL (ref 3.5–5.2)
Alkaline Phosphatase: 39 U/L (ref 39–117)
BILIRUBIN TOTAL: 0.5 mg/dL (ref 0.3–1.2)
BUN: 5 mg/dL — ABNORMAL LOW (ref 6–23)
CHLORIDE: 103 meq/L (ref 96–112)
CO2: 28 meq/L (ref 19–32)
Calcium: 9.7 mg/dL (ref 8.4–10.5)
Creatinine, Ser: 1.08 mg/dL (ref 0.50–1.35)
GLUCOSE: 93 mg/dL (ref 70–99)
Potassium: 4.4 mEq/L (ref 3.7–5.3)
SODIUM: 141 meq/L (ref 137–147)
Total Protein: 6.4 g/dL (ref 6.0–8.3)

## 2014-01-16 LAB — VALPROIC ACID LEVEL: Valproic Acid Lvl: 42.3 ug/mL — ABNORMAL LOW (ref 50.0–100.0)

## 2014-01-16 MED ORDER — BENZTROPINE MESYLATE 1 MG PO TABS
1.0000 mg | ORAL_TABLET | Freq: Two times a day (BID) | ORAL | Status: DC
  Administered 2014-01-16 – 2014-01-22 (×12): 1 mg via ORAL
  Filled 2014-01-16 (×15): qty 1

## 2014-01-16 NOTE — BHH Group Notes (Signed)
[facility] LCSW Aftercare Discharge Planning Group Note   01/16/2014  8:45 AM  Participation Quality:  Did Not Attend - pt sleeping in his room  Malkie Wille Horton, LCSW 01/16/2014 9:26 AM        

## 2014-01-16 NOTE — Progress Notes (Signed)
Adult Psychoeducational Group Note  Date:  01/16/2014 Time:  11:52 PM  Group Topic/Focus:  Wrap-Up Group:   The focus of this group is to help patients review their daily goal of treatment and discuss progress on daily workbooks.  Participation Level:  Active  Participation Quality:  Appropriate  Affect:  Appropriate  Cognitive:  Appropriate  Insight: Appropriate  Engagement in Group:  Engaged  Modes of Intervention:  Discussion  Additional Comments: The patient said that he felt extremely better.The patient said that he is dealing with one day at a time.  Brian [name] 01/16/2014, 11:52 PM

## 2014-01-16 NOTE — Progress Notes (Signed)
D: Pt reports depression 7/10. When asked what is causing pt to feel depressed, he stated, "life". Pt endorses SI thoughts this morning. Pt verbally contracts for safety. Pt is withdrawn and isolates in his room. Pt have not attended groups. Pt compliant with taking meds. Pt c/o of vomiting this afternoon. A: Medications administered as ordered per MD. Verbal support given. Pt encouraged to attend groups starting today until he is discharged.  15 minute checks performed for safety. R: Pt safety maintained at this time.

## 2014-01-16 NOTE — Tx Team (Signed)
Interdisciplinary Treatment Plan Update (Adult)  Date: 01/16/2014  Time Reviewed:  9:45 AM  Progress in Treatment: Attending groups: Yes Participating in groups:  Yes Taking medication as prescribed:  Yes Tolerating medication:  Yes Family/Significant othe contact made: No, consent to talk to mother Patient understands diagnosis:  Yes Discussing patient identified problems/goals with staff:  Yes Medical problems stabilized or resolved:  Yes Denies suicidal/homicidal ideation: Yes Issues/concerns per patient self-inventory:  Yes Other:  New problem(s) identified: N/A  Discharge Plan or Barriers: Pt has follow up scheduled at [facility] for outpatient medication management and therapy.    Reason for Continuation of Hospitalization: Anxiety Depression Medication Stabilization  Comments: N/A  Estimated length of stay: 3-5 days  For review of initial/current patient goals, please see plan of care.  Attendees: Patient:     Family:     Physician:  Dr. Zorita Pang 01/16/2014 10:12 AM   Nursing:   Gaylan Gerold, RN 01/16/2014 10:12 AM   Clinical Social Worker:  Regan Lemming, LCSW 01/16/2014 10:12 AM   Other: Nena Polio, PA 01/16/2014 10:12 AM   Other:  Norberto Sorenson, care coordination 01/16/2014 10:12 AM   Other:  Darrol Angel, RN 01/16/2014 10:12 AM   Other:  Para March, RN 01/16/2014 10:13 AM   Other:    Other:    Other:    Other:    Other:    Other:     Scribe for Treatment Team:   Ane Payment, 01/16/2014 10:12 AM

## 2014-01-16 NOTE — Progress Notes (Signed)
Patient ID: Brian [name], male   DOB: 07-02-1986, 28 y.o.   MRN: [id number] [facility] MD Progress Note  01/16/2014 11:33 AM Brian [name]  MRN:  [id number] Subjective:  Met with the patient who states he feels better than when he arrived. He knows he is better because he is not thinking about drinking as much, but he is sleeping too much still, rates his depression as a 5/10 which is down from an 8 on admission. His anxiety is a 8/10 and states he knows he is anxious because he can't stop moving his legs, or touching the back of his head, and feels that he must move all the time. He states this has been going on for 3 years since he started [place].   Brian [name] states that he is sleeping too much and that the voices aren't as bad because he has been asleep most of the time. Diagnosis:   DSM5: Schizophrenia Disorders:  Schizophrenia (295.7) Obsessive-Compulsive Disorders:   Trauma-Stressor Disorders:   Substance/Addictive Disorders:  Alcohol Related Disorder - Severe (303.90), Cannabis Use Disorder - Severe (304.30) and Opioid Disorder - Severe (304.00) Depressive Disorders:   Total Time spent with patient: 30 minutes  Axis I: Schizoaffective Disorder, Substance Abuse and Substance Induced Mood Disorder  ADL's:  Impaired  Sleep: Fair  Appetite:  Poor  Suicidal Ideation:  Patient has suicidal ideation and contract for safety while in hospital Homicidal Ideation:  denied AEB (as evidenced by):  Psychiatric Specialty Exam: Physical Exam  ROS  Blood pressure 129/92, pulse 66, temperature 98.1 F (36.7 C), temperature source Oral, resp. rate 20, height _0  (1.803 m), weight 108.863 kg (240 lb), SpO2 99.00%.Body mass index is 33.49 kg/(m^2).  General Appearance: Guarded  Eye Contact::  Fair  Speech:  Clear and Coherent  Volume:  Decreased  Mood:  Anxious and Depressed  Affect:  Congruent and Depressed  Thought Process:  Coherent and Goal Directed  Orientation:  Full (Time, Place, and  Person)  Thought Content:  Obsessions and Rumination  Suicidal Thoughts:  Yes.  with intent/plan  Homicidal Thoughts:  No  Memory:  Immediate;   Fair  Judgement:  Intact  Insight:  Fair  Psychomotor Activity:  Psychomotor Retardation  Concentration:  Fair  Recall:  [employer] of Knowledge:Fair  Language: Fair  Akathisia:  NA  Handed:  Right  AIMS (if indicated):     Assets:  Communication Skills Desire for Improvement Financial Resources/Insurance Housing Leisure Time Physical Health Resilience Social Support  Sleep:  Number of Hours: 6.75   Musculoskeletal: Strength & Muscle Tone: within normal limits Gait & Station: normal Patient leans: N/A  Current Medications: Current Facility-Administered Medications  Medication Dose Route Frequency Provider Last Rate Last Dose  . acetaminophen (TYLENOL) tablet 650 mg  650 mg Oral Q6H PRN Delfin Gant, NP      . alum & mag hydroxide-simeth (MAALOX/MYLANTA) 200-200-20 MG/5ML suspension 30 mL  30 mL Oral PRN Delfin Gant, NP      . cloNIDine (CATAPRES) tablet 0.1 mg  0.1 mg Oral BID BM & HS PRN Durward Parcel, MD      . divalproex (DEPAKOTE) DR tablet 1,500 mg  1,500 mg Oral QHS Delfin Gant, NP   1,500 mg at 01/15/14 2134  . FLUoxetine (PROZAC) capsule 20 mg  20 mg Oral q morning - 10a Delfin Gant, NP   20 mg at 01/15/14 1210  . hydrOXYzine (ATARAX/VISTARIL) tablet 25 mg  25 mg Oral Q6H  PRN Delfin Gant, NP      . ibuprofen (ADVIL,MOTRIN) tablet 600 mg  600 mg Oral Q8H PRN Delfin Gant, NP      . LORazepam (ATIVAN) tablet 0-4 mg  0-4 mg Oral Q12H Delfin Gant, NP   1 mg at 01/15/14 1710  . LORazepam (ATIVAN) tablet 1 mg  1 mg Oral Q8H PRN Delfin Gant, NP   1 mg at 01/13/14 2048  . lurasidone (LATUDA) tablet 80 mg  80 mg Oral Q breakfast Durward Parcel, MD   80 mg at 01/16/14 2951  . magnesium hydroxide (MILK OF MAGNESIA) suspension 30 mL  30 mL Oral Daily PRN  Delfin Gant, NP      . nicotine (NICODERM CQ - dosed in mg/24 hours) patch 21 mg  21 mg Transdermal Daily Delfin Gant, NP   21 mg at 01/16/14 0800  . ondansetron (ZOFRAN) tablet 4 mg  4 mg Oral Q8H PRN Delfin Gant, NP   4 mg at 01/15/14 1533  . thiamine (VITAMIN B-1) tablet 100 mg  100 mg Oral Daily Delfin Gant, NP   100 mg at 01/16/14 8841  . traZODone (DESYREL) tablet 100 mg  100 mg Oral QHS Delfin Gant, NP   100 mg at 01/15/14 2133    Lab Results:  Results for orders placed during the hospital encounter of 01/13/14 (from the past 48 hour(s))  VALPROIC ACID LEVEL     Status: Abnormal   Collection Time    01/16/14  6:20 AM      Result Value Ref Range   Valproic Acid Lvl 42.3 (*) 50.0 - 100.0 ug/mL   Comment: Performed at [place] PANEL     Status: Abnormal   Collection Time    01/16/14  6:20 AM      Result Value Ref Range   Sodium 141  137 - 147 mEq/L   Potassium 4.4  3.7 - 5.3 mEq/L   Chloride 103  96 - 112 mEq/L   CO2 28  19 - 32 mEq/L   Glucose, Bld 93  70 - 99 mg/dL   BUN 5 (*) 6 - 23 mg/dL   Creatinine, Ser 1.08  0.50 - 1.35 mg/dL   Calcium 9.7  8.4 - 10.5 mg/dL   Total Protein 6.4  6.0 - 8.3 g/dL   Albumin 3.7  3.5 - 5.2 g/dL   AST 23  0 - 37 U/L   ALT 24  0 - 53 U/L   Alkaline Phosphatase 39  39 - 117 U/L   Total Bilirubin 0.5  0.3 - 1.2 mg/dL   GFR calc non Af Amer >90  >90 mL/min   GFR calc Af Amer >90  >90 mL/min   Comment: (NOTE)     The eGFR has been calculated using the CKD EPI equation.     This calculation has not been validated in all clinical situations.     eGFR's persistently <90 mL/min signify possible Chronic Kidney     Disease.     Performed at [facility]    Physical Findings: AIMS: Facial and Oral Movements Muscles of Facial Expression: None, normal Lips and Perioral Area: None, normal Jaw: None, normal Tongue: None, normal,Extremity Movements Upper  (arms, wrists, hands, fingers): None, normal Lower (legs, knees, ankles, toes): None, normal, Trunk Movements Neck, shoulders, hips: None, normal, Overall Severity Severity of abnormal movements (highest score from questions above): None,  normal Incapacitation due to abnormal movements: None, normal Patient's awareness of abnormal movements (rate only patient's report): No Awareness, Dental Status Current problems with teeth and/or dentures?: No Does patient usually wear dentures?: No  CIWA:  CIWA-Ar Total: 1 COWS:  COWS Total Score: 1  Treatment Plan Summary: Daily contact with patient to assess and evaluate symptoms and progress in treatment Medication management  Plan: 1. R/O EPS vs TD Vs Akathesia. Treatment Plan/Recommendations:   1. Continue crisis management and stabilization. 2. Medication management to reduce current symptoms to base line and improve the patient's overall level of functioning. Monitor for adverse affects and provide Zofran PRN for vomiting.  3. Treat health problems as indicated. 4. Develop treatment plan to decrease risk of relapse upon discharge and to reduce the need for readmission. 5. Psycho-social education regarding relapse prevention and self care. 6. Health care follow up as needed for medical problems. 7. Will start Cogentin 70m po BID to address possible EPS vs TD. 8. Will evaluate old records further to assess previous medications for possible medication change if needed. Medical Decision Making Problem Points:  Established problem, worsening (2), New problem, with no additional work-up planned (3), Review of last therapy session (1) and Review of psycho-social stressors (1) Data Points:  Review or order clinical lab tests (1) Review or order medicine tests (1) Review of medication regiment & side effects (2) Review of new medications or change in dosage (2)  I certify that inpatient services furnished can reasonably be expected to improve the  patient's condition.   NMarlane Hatcher Mashburn RPAC 11:49 AM 01/16/2014  Reviewed the information documented and agree with the treatment plan.  JParke SimmersJonnalagadda 01/16/2014 12:49 PM

## 2014-01-16 NOTE — [facility] Group Notes (Signed)
[place] LCSW Group Therapy  01/16/2014 1:15 PM   Type of Therapy:  Group Therapy  Participation Level:  Did Not Attend - pt sleeping in his room  Regan Lemming, [place] 01/16/2014 3:08 PM

## 2014-01-17 NOTE — Progress Notes (Addendum)
D:  Patient's self inventory sheet, patient sleeps well, poor appetite, low energy level, poor attention span. Rated depression, hopeless, anxiety #5.  Denied withdrawals.  Denied SI.  Denied physical problems but has experienced nausea this morning.  Plans to discharge home.  No problems taking meds after discharge. A:  Patient felt nauseated this morning, gave zofran, pt took oral meds after nausea subsided.  Have given ginger ale and crackers also.  Emotional support and encouragement given patient. R:  Denied SI and HI.  Denied A/V hallucinations.  Will continue to monitor patient for safety with 15 minute checks.  Safety maintained. Patient got out of bed and went to 1100 outside recreation.  Will continue to monitor patient for safety with 15 minute checks.  Safety maintained.    1800  Patient ate spicy food for dinner, vomited afterwards in bathroom.  Mom stated he likes to eat spicy foods.  Talked to patient again about eating foods without spices, etc. That would not upset his stomach.  Bathroom cleaned by nurse and housekeeping called.  Patient in dayroom with mom and peers.

## 2014-01-17 NOTE — Progress Notes (Signed)
The focus of this group is to educate the patient on the purpose and policies of crisis stabilization and provide a format to answer questions about their admission.  The group details unit policies and expectations of patients while admitted.  Patient did not attend 0900 nurse education orientation group this morning.  Patient stayed in bed sleeping.    

## 2014-01-17 NOTE — Progress Notes (Signed)
D: After the introduction the writer asked pt about his day. Pt stated, "I haven't defecated in about a week. I think that might be what's making me sick".  Writer acknowledged that report was given of pt vomiting earlier in the day. Informed pt that a dose of MOM could be given for constipation.  Writer asked pt about conversation between himself and his treatment team. Pt stated that he plans to discharge "back into his mom's house and start over again". Also plans to go to [place] and AA.  A:  Support and encouragement was offered. 15 min checks continued for safety.  R: Pt remains safe.

## 2014-01-17 NOTE — Progress Notes (Signed)
Adult Psychoeducational Group Note  Date:  01/17/2014 Time:  10:00am  Group Topic/Focus:  Making Healthy Choices:   The focus of this group is to help patients identify negative/unhealthy choices they were using prior to admission and identify positive/healthier coping strategies to replace them upon discharge.  Participation Level:  Active  Participation Quality:  Appropriate and Attentive  Affect:  Appropriate  Cognitive:  Alert and Appropriate  Insight: Appropriate  Engagement in Group:  Engaged  Modes of Intervention:  Discussion and Education  Additional Comments:  Discussion was on lifestyle changes and what is one  Change you can make to improve your life? Pt stated to stop drinking.  Gailen Shelter 01/17/2014, 1:53 PM

## 2014-01-17 NOTE — Progress Notes (Signed)
Patient ID: Brian [name], male   DOB: Oct 13, 1985, 28 y.o.   MRN: [id number] Patient ID: Brian [name], male   DOB: 08/31/1985, 28 y.o.   MRN: [id number] [facility] MD Progress Note  01/17/2014 2:37 PM Brian [name]  MRN:  [id number]  Subjective:  Patient has been compliant with his medication management and has not reported it was fracture. Patient reportedly participating in unit activities and learning coping skills to manage his stresses. Patient states he feels better than when he arrived. Patient rates his depression as a 4/10 which is down from an 8 on admission. His anxiety is a 6/10. Patient stated his medication helped him to be less restless today. Brian [name] states that the voices aren't as bad because he has been asleep most of the time. Patient denied paranoia or delusions. Patient contracts for safety while in the hospital.  Diagnosis:   DSM5: Schizophrenia Disorders:  Schizophrenia (295.7) Obsessive-Compulsive Disorders:   Trauma-Stressor Disorders:   Substance/Addictive Disorders:  Alcohol Related Disorder - Severe (303.90), Cannabis Use Disorder - Severe (304.30) and Opioid Disorder - Severe (304.00) Depressive Disorders:   Total Time spent with patient: 30 minutes  Axis I: Schizoaffective Disorder, Substance Abuse and Substance Induced Mood Disorder  ADL's:  Impaired  Sleep: Fair  Appetite:  Poor  Suicidal Ideation:  Patient has suicidal ideation and contract for safety while in hospital Homicidal Ideation:  denied AEB (as evidenced by):  Psychiatric Specialty Exam: Physical Exam  ROS  Blood pressure 130/84, pulse 91, temperature 98 F (36.7 C), temperature source Oral, resp. rate 20, height _0  (1.803 m), weight 108.863 kg (240 lb), SpO2 99.00%.Body mass index is 33.49 kg/(m^2).  General Appearance: Guarded  Eye Contact::  Fair  Speech:  Clear and Coherent  Volume:  Decreased  Mood:  Anxious and Depressed  Affect:  Congruent and Depressed  Thought Process:  Coherent  and Goal Directed  Orientation:  Full (Time, Place, and Person)  Thought Content:  Obsessions and Rumination  Suicidal Thoughts:  Yes.  with intent/plan  Homicidal Thoughts:  No  Memory:  Immediate;   Fair  Judgement:  Intact  Insight:  Fair  Psychomotor Activity:  Psychomotor Retardation  Concentration:  Fair  Recall:  [employer] of Knowledge:Fair  Language: Fair  Akathisia:  NA  Handed:  Right  AIMS (if indicated):     Assets:  Communication Skills Desire for Improvement Financial Resources/Insurance Housing Leisure Time Physical Health Resilience Social Support  Sleep:  Number of Hours: 6.5   Musculoskeletal: Strength & Muscle Tone: within normal limits Gait & Station: normal Patient leans: N/A  Current Medications: Current Facility-Administered Medications  Medication Dose Route Frequency Provider Last Rate Last Dose  . acetaminophen (TYLENOL) tablet 650 mg  650 mg Oral Q6H PRN Delfin Gant, NP      . alum & mag hydroxide-simeth (MAALOX/MYLANTA) 200-200-20 MG/5ML suspension 30 mL  30 mL Oral PRN Delfin Gant, NP      . cloNIDine (CATAPRES) tablet 0.1 mg  0.1 mg Oral BID BM & HS PRN Durward Parcel, MD      . divalproex (DEPAKOTE) DR tablet 1,500 mg  1,500 mg Oral QHS Delfin Gant, NP   1,500 mg at 01/15/14 2134  . FLUoxetine (PROZAC) capsule 20 mg  20 mg Oral q morning - 10a Delfin Gant, NP   20 mg at 01/15/14 1210  . hydrOXYzine (ATARAX/VISTARIL) tablet 25 mg  25 mg Oral Q6H PRN Delfin Gant, NP      .  ibuprofen (ADVIL,MOTRIN) tablet 600 mg  600 mg Oral Q8H PRN Delfin Gant, NP      . LORazepam (ATIVAN) tablet 0-4 mg  0-4 mg Oral Q12H Delfin Gant, NP   1 mg at 01/15/14 1710  . LORazepam (ATIVAN) tablet 1 mg  1 mg Oral Q8H PRN Delfin Gant, NP   1 mg at 01/13/14 2048  . lurasidone (LATUDA) tablet 80 mg  80 mg Oral Q breakfast Durward Parcel, MD   80 mg at 01/16/14 4270  . magnesium hydroxide (MILK  OF MAGNESIA) suspension 30 mL  30 mL Oral Daily PRN Delfin Gant, NP      . nicotine (NICODERM CQ - dosed in mg/24 hours) patch 21 mg  21 mg Transdermal Daily Delfin Gant, NP   21 mg at 01/16/14 0800  . ondansetron (ZOFRAN) tablet 4 mg  4 mg Oral Q8H PRN Delfin Gant, NP   4 mg at 01/15/14 1533  . thiamine (VITAMIN B-1) tablet 100 mg  100 mg Oral Daily Delfin Gant, NP   100 mg at 01/16/14 6237  . traZODone (DESYREL) tablet 100 mg  100 mg Oral QHS Delfin Gant, NP   100 mg at 01/15/14 2133    Lab Results:  Results for orders placed during the hospital encounter of 01/13/14 (from the past 48 hour(s))  VALPROIC ACID LEVEL     Status: Abnormal   Collection Time    01/16/14  6:20 AM      Result Value Ref Range   Valproic Acid Lvl 42.3 (*) 50.0 - 100.0 ug/mL   Comment: Performed at [place] PANEL     Status: Abnormal   Collection Time    01/16/14  6:20 AM      Result Value Ref Range   Sodium 141  137 - 147 mEq/L   Potassium 4.4  3.7 - 5.3 mEq/L   Chloride 103  96 - 112 mEq/L   CO2 28  19 - 32 mEq/L   Glucose, Bld 93  70 - 99 mg/dL   BUN 5 (*) 6 - 23 mg/dL   Creatinine, Ser 1.08  0.50 - 1.35 mg/dL   Calcium 9.7  8.4 - 10.5 mg/dL   Total Protein 6.4  6.0 - 8.3 g/dL   Albumin 3.7  3.5 - 5.2 g/dL   AST 23  0 - 37 U/L   ALT 24  0 - 53 U/L   Alkaline Phosphatase 39  39 - 117 U/L   Total Bilirubin 0.5  0.3 - 1.2 mg/dL   GFR calc non Af Amer >90  >90 mL/min   GFR calc Af Amer >90  >90 mL/min   Comment: (NOTE)     The eGFR has been calculated using the CKD EPI equation.     This calculation has not been validated in all clinical situations.     eGFR's persistently <90 mL/min signify possible Chronic Kidney     Disease.     Performed at [facility]    Physical Findings: AIMS: Facial and Oral Movements Muscles of Facial Expression: None, normal Lips and Perioral Area: None, normal Jaw: None,  normal Tongue: None, normal,Extremity Movements Upper (arms, wrists, hands, fingers): None, normal Lower (legs, knees, ankles, toes): None, normal, Trunk Movements Neck, shoulders, hips: None, normal, Overall Severity Severity of abnormal movements (highest score from questions above): None, normal Incapacitation due to abnormal movements: None, normal Patient's awareness of  abnormal movements (rate only patient's report): No Awareness, Dental Status Current problems with teeth and/or dentures?: No Does patient usually wear dentures?: No  CIWA:  CIWA-Ar Total: 2 COWS:  COWS Total Score: 3  Treatment Plan Summary: Daily contact with patient to assess and evaluate symptoms and progress in treatment Medication management  Plan:  Treatment Plan/Recommendations:   1. Continue crisis management and stabilization. 2. Medication management to reduce current symptoms to base line and improve the patient's overall level of functioning. Monitor for adverse affects and provide Zofran PRN for vomiting.  3. Treat health problems as indicated. 4. Develop treatment plan to decrease risk of relapse upon discharge and to reduce the need for readmission. 5. Psycho-social education regarding relapse prevention and self care. 6. Health care follow up as needed for medical problems. 7.  Continue  Cogentin 6m po BID for possible EPS 8. Will evaluate old records further to assess previous medications for possible medication change if needed. 9. Disposition plans are in progress and will discuss the case in the morning the case manager and the treatment team regarding appropriate disposition plans  Medical Decision Making Problem Points:  Established problem, worsening (2), New problem, with no additional work-up planned (3), Review of last therapy session (1) and Review of psycho-social stressors (1) Data Points:  Review or order clinical lab tests (1) Review or order medicine tests (1) Review of  medication regiment & side effects (2) Review of new medications or change in dosage (2)  I certify that inpatient services furnished can reasonably be expected to improve the patient's condition.   JDurward Parcel5/28/2015 2:37 PM

## 2014-01-17 NOTE — [facility] Group Notes (Signed)
[facility] LCSW Group Therapy  01/17/2014 1:15 PM   Type of Therapy:  Group Therapy  Participation Level:  Did Not Attend - pt sleeping in his room  Chantia Amalfitano Horton, LCSW 01/17/2014 2:37 PM   

## 2014-01-17 NOTE — Progress Notes (Signed)
Patient ID: Brian [name], male   DOB: June 25, 1986, 28 y.o.   MRN: [id number] D: Pt. Visible in dayroom interacting laughing, then ask for anxiety medications "I need one of those pills I can get every six hours for anxiety" reports anxiety at "6" of 10. A: Writer introduced self to client, administered Vistaril 25 mg po, encouraged group. R: Pt. Is safe on the unit, attended group.

## 2014-01-17 NOTE — [facility] Suicide Risk Assessment (Signed)
[facility] INPATIENT:  Family/Significant Other Suicide Prevention Education  Suicide Prevention Education:  Education Completed; Brian [name], Mother, [phone]; has been identified by the patient as the family member/significant other with whom the patient will be residing, and identified as the person(s) who will aid the patient in the event of a mental health crisis (suicidal ideations/suicide attempt).  With written consent from the patient, the family member/significant other has been provided the following suicide prevention education, prior to the and/or following the discharge of the patient.  The suicide prevention education provided includes the following:  Suicide risk factors  Suicide prevention and interventions  National Suicide Hotline telephone number  [facility] assessment telephone number  [facility] Emergency Assistance [place] and/or Residential Mobile Crisis Unit telephone number  Request made of family/significant other to:  Remove weapons (e.g., guns, rifles, knives), all items previously/currently identified as safety concern.  Mother advised patient does not have access to weapons.    Remove drugs/medications (over-the-counter, prescriptions, illicit drugs), all items previously/currently identified as a safety concern.  The family member/significant other verbalizes understanding of the suicide prevention education information provided.  The family member/significant other agrees to remove the items of safety concern listed above.  Brian [name] 01/17/2014, 8:59 AM

## 2014-01-17 NOTE — Progress Notes (Signed)
Adult Psychoeducational Group Note  Date:  01/16/2014 Time:  10:00am Group Topic/Focus:  Personal Choices and Values:   The focus of this group is to help patients assess and explore the importance of values in their lives, how their values affect their decisions, how they express their values and what opposes their expression.  Participation Level:    Participation Quality:    Affect:    Cognitive:   Insight:   Engagement in Group:    Modes of Intervention:    Additional Comments:  Pt did not attend group.  Gailen Shelter 01/17/2014, 9:36 AM

## 2014-01-18 MED ORDER — LURASIDONE HCL 40 MG PO TABS
40.0000 mg | ORAL_TABLET | Freq: Every day | ORAL | Status: DC
  Administered 2014-01-18 – 2014-01-20 (×4): 40 mg via ORAL
  Filled 2014-01-18 (×4): qty 1

## 2014-01-18 NOTE — Progress Notes (Signed)
Attended Group

## 2014-01-18 NOTE — Progress Notes (Signed)
Brian [name] is UAL OOB on the 500 hall...tolerated fair. HE  Is seen out in the dayroom...interacting with his peers, tolerated fair. HE keeps to himself, mostly but does frequent the dayroom.   A He completes his morning inventory and on it he writes he denies  SI within the past 24 hrs and he rates his depression and hopelessness "0/1" and says his DC plan is to " not to break a promise anynmore".   R Safety is in place and poc moves forward,.

## 2014-01-18 NOTE — [facility] Group Notes (Signed)
[facility] LCSW Aftercare Discharge Planning Group Note   01/18/2014 10:05 AM  Participation Quality:  Did not attend group.  [place]

## 2014-01-18 NOTE — Progress Notes (Signed)
Patient ID: Brian [name], male   DOB: 1985-09-17, 28 y.o.   MRN: [id number] [facility] MD Progress Note  01/18/2014 1:32 PM Brian [name]  MRN:  [id number]  Subjective:  Met with Brian [name] today who reported that he is able to keep his food down! He is very pleased with this. He is also informed that his Brian [name] was reduced to $RemoveBe'40mg'erYJcqmNx$  to see if the reduction in medication will reduce his symptoms. He understands that he must take his medication as ordered especially the cogentin. He is to continue through the weekend to see if his symptoms improve or his psychosis worsens. He does feel that the Cogentin is working some and states that his legs are not moving as much.  Diagnosis:   DSM5: Schizophrenia Disorders:  Schizophrenia (295.7) Obsessive-Compulsive Disorders:   Trauma-Stressor Disorders:   Substance/Addictive Disorders:  Alcohol Related Disorder - Severe (303.90), Cannabis Use Disorder - Severe (304.30) and Opioid Disorder - Severe (304.00) Depressive Disorders:   Total Time spent with patient: 30 minutes  Axis I: Schizoaffective Disorder, Substance Abuse and Substance Induced Mood Disorder  ADL's:  Impaired  Sleep: Fair  Appetite:  Poor  Suicidal Ideation:  Patient has suicidal ideation and contract for safety while in hospital Homicidal Ideation:  denied AEB (as evidenced by):  Psychiatric Specialty Exam: Physical Exam  ROS  Blood pressure 106/71, pulse 98, temperature 97.6 F (36.4 C), temperature source Oral, resp. rate 18, height $RemoveBe'5\' 11"'tFvHUzcfV$  (1.803 m), weight 108.863 kg (240 lb), SpO2 99.00%.Body mass index is 33.49 kg/(m^2).  General Appearance: Guarded  Eye Contact::  Fair  Speech:  Clear and Coherent  Volume:  Decreased  Mood:  Anxious and Depressed  Affect:  Congruent and Depressed  Thought Process:  Coherent and Goal Directed  Orientation:  Full (Time, Place, and Person)  Thought Content:  Obsessions and Rumination  Suicidal Thoughts:  Yes.  with intent/plan  Homicidal  Thoughts:  No  Memory:  Immediate;   Fair  Judgement:  Intact  Insight:  Fair  Psychomotor Activity:  Psychomotor Retardation  Concentration:  Fair  Recall:  [employer] of Knowledge:Fair  Language: Fair  Akathisia:  NA  Handed:  Right  AIMS (if indicated):     Assets:  Communication Skills Desire for Improvement Financial Resources/Insurance Housing Leisure Time Physical Health Resilience Social Support  Sleep:  Number of Hours: 7   Musculoskeletal: Strength & Muscle Tone: within normal limits Gait & Station: normal Patient leans: N/A  Current Medications: Current Facility-Administered Medications  Medication Dose Route Frequency Provider Last Rate Last Dose  . acetaminophen (TYLENOL) tablet 650 mg  650 mg Oral Q6H PRN Brian Gant, NP      . alum & mag hydroxide-simeth (MAALOX/MYLANTA) 200-200-20 MG/5ML suspension 30 mL  30 mL Oral PRN Brian Gant, NP      . cloNIDine (CATAPRES) tablet 0.1 mg  0.1 mg Oral BID BM & HS PRN Brian Parcel, MD      . divalproex (DEPAKOTE) DR tablet 1,500 mg  1,500 mg Oral QHS Brian Gant, NP   1,500 mg at 01/15/14 2134  . FLUoxetine (PROZAC) capsule 20 mg  20 mg Oral q morning - 10a Brian Gant, NP   20 mg at 01/15/14 1210  . hydrOXYzine (ATARAX/VISTARIL) tablet 25 mg  25 mg Oral Q6H PRN Brian Gant, NP      . ibuprofen (ADVIL,MOTRIN) tablet 600 mg  600 mg Oral Q8H PRN Brian Gant, NP      .  LORazepam (ATIVAN) tablet 0-4 mg  0-4 mg Oral Q12H Brian Gant, NP   1 mg at 01/15/14 1710  . LORazepam (ATIVAN) tablet 1 mg  1 mg Oral Q8H PRN Brian Gant, NP   1 mg at 01/13/14 2048  . lurasidone (LATUDA) tablet 80 mg  80 mg Oral Q breakfast Brian Parcel, MD   80 mg at 01/16/14 7425  . magnesium hydroxide (MILK OF MAGNESIA) suspension 30 mL  30 mL Oral Daily PRN Brian Gant, NP      . nicotine (NICODERM CQ - dosed in mg/24 hours) patch 21 mg  21 mg Transdermal Daily  Brian Gant, NP   21 mg at 01/16/14 0800  . ondansetron (ZOFRAN) tablet 4 mg  4 mg Oral Q8H PRN Brian Gant, NP   4 mg at 01/15/14 1533  . thiamine (VITAMIN B-1) tablet 100 mg  100 mg Oral Daily Brian Gant, NP   100 mg at 01/16/14 9563  . traZODone (DESYREL) tablet 100 mg  100 mg Oral QHS Brian Gant, NP   100 mg at 01/15/14 2133    Lab Results:  Results for orders placed during the hospital encounter of 01/13/14 (from the past 48 hour(s))  VALPROIC ACID LEVEL     Status: Abnormal   Collection Time    01/16/14  6:20 AM      Result Value Ref Range   Valproic Acid Lvl 42.3 (*) 50.0 - 100.0 ug/mL   Comment: Performed at [place] PANEL     Status: Abnormal   Collection Time    01/16/14  6:20 AM      Result Value Ref Range   Sodium 141  137 - 147 mEq/L   Potassium 4.4  3.7 - 5.3 mEq/L   Chloride 103  96 - 112 mEq/L   CO2 28  19 - 32 mEq/L   Glucose, Bld 93  70 - 99 mg/dL   BUN 5 (*) 6 - 23 mg/dL   Creatinine, Ser 1.08  0.50 - 1.35 mg/dL   Calcium 9.7  8.4 - 10.5 mg/dL   Total Protein 6.4  6.0 - 8.3 g/dL   Albumin 3.7  3.5 - 5.2 g/dL   AST 23  0 - 37 U/L   ALT 24  0 - 53 U/L   Alkaline Phosphatase 39  39 - 117 U/L   Total Bilirubin 0.5  0.3 - 1.2 mg/dL   GFR calc non Af Amer >90  >90 mL/min   GFR calc Af Amer >90  >90 mL/min   Comment: (NOTE)     The eGFR has been calculated using the CKD EPI equation.     This calculation has not been validated in all clinical situations.     eGFR's persistently <90 mL/min signify possible Chronic Kidney     Disease.     Performed at [facility]    Physical Findings: AIMS: Facial and Oral Movements Muscles of Facial Expression: None, normal Lips and Perioral Area: None, normal Jaw: None, normal Tongue: None, normal,Extremity Movements Upper (arms, wrists, hands, fingers): None, normal Lower (legs, knees, ankles, toes): None, normal, Trunk Movements Neck,  shoulders, hips: None, normal, Overall Severity Severity of abnormal movements (highest score from questions above): None, normal Incapacitation due to abnormal movements: None, normal Patient's awareness of abnormal movements (rate only patient's report): No Awareness, Dental Status Current problems with teeth and/or dentures?: No Does patient usually wear  dentures?: No  CIWA:  CIWA-Ar Total: 2 COWS:  COWS Total Score: 3  Treatment Plan Summary: Daily contact with patient to assess and evaluate symptoms and progress in treatment Medication management  Plan: Treatment Plan/Recommendations:   1. Continue crisis management and stabilization. 2. Medication management to reduce current symptoms to base line and improve the patient's overall level of functioning. Monitor for adverse affects and provide Zofran PRN for vomiting.  3. Treat health problems as indicated. 4. Develop treatment plan to decrease risk of relapse upon discharge and to reduce the need for readmission. 5. Psycho-social education regarding relapse prevention and self care. 6. Health care follow up as needed for medical problems. 7.  Continue  Cogentin 67m po BID for possible EPS 8. Will evaluate old records further to assess previous medications for possible medication change if needed. 9.  Lowered Latuda to 4110mpo qd due to increased nausea and vomiting. 10.Disposition plans are in progress and will discuss the case in the morning the case manager and the treatment team regarding appropriate disposition plans  Medical Decision Making Problem Points:  Established problem, worsening (2), New problem, with no additional work-up planned (3), Review of last therapy session (1) and Review of psycho-social stressors (1) Data Points:  Review or order clinical lab tests (1) Review or order medicine tests (1) Review of medication regiment & side effects (2) Review of new medications or change in dosage (2)  I certify that  inpatient services furnished can reasonably be expected to improve the patient's condition.  NeMarlane HatcherMashburn RPAC 4:21 PM 01/18/2014  Reviewed the information documented and agree with the treatment plan.  JaParke Simmersonnalagadda 01/18/2014 7:07 PM

## 2014-01-18 NOTE — Tx Team (Signed)
Interdisciplinary Treatment Plan Update   Date Reviewed:  01/18/2014  Time Reviewed:  8:29 AM  Progress in Treatment:   Attending groups: Yes Participating in groups: Yes Taking medication as prescribed: Yes  Tolerating medication: Yes Family/Significant other contact made: Yes, collateral contact with mother. Patient understands diagnosis: Yes  Discussing patient identified problems/goals with staff: Yes Medical problems stabilized or resolved: Yes Denies suicidal/homicidal ideation: Yes Patient has not harmed self or others: Yes  For review of initial/current patient goals, please see plan of care.  Estimated Length of Stay:  3-4 days  Reasons for Continued Hospitalization:  Anxiety Depression Medication stabilization   New Problems/Goals identified:    Discharge Plan or Barriers:   Home with outpatient follow up with [facility]  Additional Comments:  MD to make adjustments to medications.  Attendees:  Patient:  01/18/2014 8:29 AM   Signature: Mylinda Latina, MD 01/18/2014 8:29 AM  Signature:  Nena Polio, PA 01/18/2014 8:29 AM  Signature:  Briscoe Deutscher, RN 01/18/2014 8:29 AM  Signature:Beverly Danelle Earthly, RN 01/18/2014 8:29 AM  Signature:  Drake Leach, RN 01/18/2014 8:29 AM  Signature:  Joette Catching, LCSW 01/18/2014 8:29 AM  Signature:  Regan Lemming, LCSW 01/18/2014 8:29 AM  Signature:  Lucinda Dell, Care Coordinator [facility] 01/18/2014 8:29 AM  Signature:   01/18/2014 8:29 AM  Signature:  01/18/2014  8:29 AM  Signature:   Lars Pinks, RN [facility] 01/18/2014  8:29 AM  Signature: 01/18/2014  8:29 AM    Scribe for Treatment Team:   Joette Catching,  01/18/2014 8:29 AM

## 2014-01-18 NOTE — BHH Group Notes (Signed)
[facility] LCSW Group Therapy  Feelings Around Relapse 1:15 -2:30        01/18/2014   Type of Therapy:  Group Therapy  Participation Level:  Appropriate  Participation Quality:  Appropriate  Affect:  Appropriate  Cognitive:  Attentive Appropriate  Insight:  Developing/Improving  Engagement in Therapy: Developing/Improving  Modes of Intervention:  Discussion Exploration Problem-Solving Supportive  Summary of Progress/Problems:  The topic for today was feelings around relapse.  Patient processed feelings toward relapse and was able to relate to peers.  Patient shared relapse for him is heavy amounts of alcohol.  He stated he has been able to kick heroin and meth but he is not ready to give up alcohol. Patient identified coping skills that can be used to prevent a relapse.   Concha Pyo 01/18/2014

## 2014-01-18 NOTE — Progress Notes (Signed)
Adult Psychoeducational Group Note  Date:  01/18/2014 Time:  2:19 PM  Group Topic/Focus:  Relapse Prevention Planning:   The focus of this group is to define relapse and discuss the need for planning to combat relapse.  Participation Level:  Active  Participation Quality:  Appropriate  Affect:  Appropriate  Cognitive:  Appropriate  Insight: Appropriate  Engagement in Group:  Engaged  Modes of Intervention:  Discussion  Additional Comments:  Pt attended group this morning. Pt was appropriate and participate in group.    Sophie Quiles A Aamir Mclinden 01/18/2014,

## 2014-01-19 NOTE — BHH Group Notes (Signed)
[facility] Group Notes: (Clinical Social Work)   01/19/2014      Type of Therapy:  Group Therapy   Participation Level:  Did Not Attend    Selmer Dominion, LCSW 01/19/2014, 4:11 PM

## 2014-01-19 NOTE — BHH Group Notes (Signed)
[facility] Group Notes:  (Nursing/MHT/Case Management/Adjunct)  Date:  01/19/2014  Time:  11:23 AM  Type of Therapy:  Psychoeducational Skills  Participation Level:  Active  Participation Quality:  Appropriate  Affect:  Appropriate  Cognitive:  Appropriate  Insight:  Appropriate  Engagement in Group:  Engaged  Modes of Intervention:  Discussion  Summary of Progress/Problems: Pt did attend self inventory group, pt reported that he was negative SI/HI, no AH/VH noted. Pt rated his depression as a 3, and his helplessness/hopelessness as a 4.     Pt reported no concerns.   Yessenia Maillet Shanta Dorthy Magnussen 01/19/2014, 11:23 AM

## 2014-01-19 NOTE — Progress Notes (Signed)
Psychoeducational Group Note  Date: 01/19/2014 Time:  1015  Group Topic/Focus:  Identifying Needs:   The focus of this group is to help patients identify their personal needs that have been historically problematic and identify healthy behaviors to address their needs.  Participation Level:  Active  Participation Quality:  Appropriate  Affect:  Appropriate  Cognitive:  Oriented  Insight:  Improving  Engagement in Group:  Engaged  Additional Comments:  Pt was attentive and involved in the group.  Elle Vezina A  

## 2014-01-19 NOTE — Progress Notes (Signed)
Adult Psychoeducational Group Note  Date:  01/19/2014 Time:  11:54 PM  Group Topic/Focus:  Wrap-Up Group:   The focus of this group is to help patients review their daily goal of treatment and discuss progress on daily workbooks.  Participation Level:  Active  Participation Quality:  Appropriate  Affect:  Appropriate  Cognitive:  Appropriate  Insight: Appropriate  Engagement in Group:  Engaged  Modes of Intervention:  Discussion  Additional Comments: The patient expressed that his day was better because he was able to digest his food.  Brian [name] 01/19/2014, 11:54 PM

## 2014-01-19 NOTE — Progress Notes (Signed)
D Birl is UAL on the 500 hall today...he tolerates this well, interacting with his peers appropriately , attending hsi groups and takjng his medications as scheduled.   A HE maintains a flat, blunted affect. HE avoids eye contact and speaks through pursed lips. He completes his morning self inventory and o0n it he writes he deneis SI within the past 24 hrs, he rates his depression and hopelessness "2/2", repsectively and he writes his DC plan is to " not drink and obey my mother and father's rules".    R Safety is in place and poc moves forward.

## 2014-01-19 NOTE — Progress Notes (Signed)
Patient ID: Brian [name], male   DOB: [birth year], 28 y.o.   MRN: [id number]  D: Patient pleasant and cooperative with care. Pt interacting well with peers on unit. Pt states he is feeling paranoid of others because of his history of being cut and also of being shot by police by mistake. Pt states he cannot trust others. A: Q 15 minute safety checks, encourage staff/peer interaction and group participation. Administer medications as ordered by MD. R: Patient with dull, flat affect endorsing depression. Pt compliant with medications and group session. No distress noted during shift.

## 2014-01-19 NOTE — Progress Notes (Signed)
Adult Psychoeducational Group Note  Date:  01/19/2014 Time:  3:51 PM  Group Topic/Focus:  Recovery Goals:   The focus of this group is to identify appropriate goals for recovery and establish a plan to achieve them.  Participation Level:  Active  Participation Quality:  Appropriate  Affect:  Blunted  Cognitive:  Appropriate  Insight: Appropriate  Engagement in Group:  Engaged  Modes of Intervention:  Education  Additional Comments:  Patient was asked what his goal was after he leaves. Patient stated he is going to go back to his mother's house. Patient stated his mom, dad and sisters are his support. Patient has no real plan for getting his life back on track though. Patient stated he used to attend NA meetings but did not like them. Patient was prompted to offer more organizations that he might be able to get resources from, but patient did not seem interested.  Oralia Manis 01/19/2014, 3:51 PM

## 2014-01-19 NOTE — Progress Notes (Signed)
Patient ID: Brian [name], male   DOB: 1986/07/16, 28 y.o.   MRN: [id number] [facility] MD Progress Note  01/19/2014 4:00 PM Latravis Grine  MRN:  [id number]  Subjective: Tells me he is "feeling better and wonders about discharge"  Plan is to live with his mother in [place] and continue with [facility].  He is attending groups and presetnly is waiting to go down stairs to the gym. Depression 3/10 Anxiety 6/10 Denies SI,HI,H  Diagnosis:   DSM5: Schizophrenia Disorders:  Schizophrenia (295.7) Obsessive-Compulsive Disorders:   Trauma-Stressor Disorders:   Substance/Addictive Disorders:  Alcohol Related Disorder - Severe (303.90), Cannabis Use Disorder - Severe (304.30) and Opioid Disorder - Severe (304.00) Depressive Disorders:   Total Time spent with patient: 30 minutes  Axis I: Schizoaffective Disorder, Substance Abuse and Substance Induced Mood Disorder  ADL's:  Impaired  Sleep: Fair  Appetite:  Poor-eats well at breakfast, other meals 'its getting better"  Suicidal Ideation:  Patient has suicidal ideation and contract for safety while in hospital Homicidal Ideation:  denied AEB (as evidenced by):  Psychiatric Specialty Exam: Physical Exam  Constitutional: He is oriented to person, place, and time. He appears well-developed and well-nourished.  HENT:  Head: Normocephalic.  Neck: Normal range of motion. Neck supple.  Respiratory: Effort normal.  Musculoskeletal: Normal range of motion.  Neurological: He is alert and oriented to person, place, and time.  Skin: Skin is warm and dry.    ROS  Blood pressure 136/78, pulse 102, temperature 97.7 F (36.5 C), temperature source Oral, resp. rate 18, height $RemoveBe'5\' 11"'eGTydfeQn$  (1.803 m), weight 108.863 kg (240 lb), SpO2 99.00%.Body mass index is 33.49 kg/(m^2).  General Appearance: Guarded  Eye Contact::  Fair  Speech:  Clear and Coherent  Volume:  Decreased  Mood:  Anxious and Depressed  Affect:  Congruent and Depressed  Thought Process:   Coherent and Goal Directed  Orientation:  Full (Time, Place, and Person)  Thought Content:  Obsessions and Rumination  Suicidal Thoughts:  Denies presently   Homicidal Thoughts:  No  Memory:  Immediate;   Fair  Judgement:  Intact  Insight:  Fair  Psychomotor Activity:  Psychomotor Retardation  Concentration:  Fair  Recall:  [employer] of Knowledge:Fair  Language: Fair  Akathisia:  NA  Handed:  Right  AIMS (if indicated):     Assets:  Communication Skills Desire for Improvement Financial Resources/Insurance Housing Leisure Time Physical Health Resilience Social Support  Sleep:  Number of Hours: 7   Musculoskeletal: Strength & Muscle Tone: within normal limits Gait & Station: normal Patient leans: N/A  Current Medications: Current Facility-Administered Medications  Medication Dose Route Frequency Provider Last Rate Last Dose  . acetaminophen (TYLENOL) tablet 650 mg  650 mg Oral Q6H PRN Delfin Gant, NP      . alum & mag hydroxide-simeth (MAALOX/MYLANTA) 200-200-20 MG/5ML suspension 30 mL  30 mL Oral PRN Delfin Gant, NP      . cloNIDine (CATAPRES) tablet 0.1 mg  0.1 mg Oral BID BM & HS PRN Durward Parcel, MD      . divalproex (DEPAKOTE) DR tablet 1,500 mg  1,500 mg Oral QHS Delfin Gant, NP   1,500 mg at 01/15/14 2134  . FLUoxetine (PROZAC) capsule 20 mg  20 mg Oral q morning - 10a Delfin Gant, NP   20 mg at 01/15/14 1210  . hydrOXYzine (ATARAX/VISTARIL) tablet 25 mg  25 mg Oral Q6H PRN Delfin Gant, NP      .  ibuprofen (ADVIL,MOTRIN) tablet 600 mg  600 mg Oral Q8H PRN Delfin Gant, NP      . LORazepam (ATIVAN) tablet 0-4 mg  0-4 mg Oral Q12H Delfin Gant, NP   1 mg at 01/15/14 1710  . LORazepam (ATIVAN) tablet 1 mg  1 mg Oral Q8H PRN Delfin Gant, NP   1 mg at 01/13/14 2048  . lurasidone (LATUDA) tablet 80 mg  80 mg Oral Q breakfast Durward Parcel, MD   80 mg at 01/16/14 4270  . magnesium hydroxide (MILK  OF MAGNESIA) suspension 30 mL  30 mL Oral Daily PRN Delfin Gant, NP      . nicotine (NICODERM CQ - dosed in mg/24 hours) patch 21 mg  21 mg Transdermal Daily Delfin Gant, NP   21 mg at 01/16/14 0800  . ondansetron (ZOFRAN) tablet 4 mg  4 mg Oral Q8H PRN Delfin Gant, NP   4 mg at 01/15/14 1533  . thiamine (VITAMIN B-1) tablet 100 mg  100 mg Oral Daily Delfin Gant, NP   100 mg at 01/16/14 6237  . traZODone (DESYREL) tablet 100 mg  100 mg Oral QHS Delfin Gant, NP   100 mg at 01/15/14 2133    Lab Results:  Results for orders placed during the hospital encounter of 01/13/14 (from the past 48 hour(s))  VALPROIC ACID LEVEL     Status: Abnormal   Collection Time    01/16/14  6:20 AM      Result Value Ref Range   Valproic Acid Lvl 42.3 (*) 50.0 - 100.0 ug/mL   Comment: Performed at [facility] PANEL     Status: Abnormal   Collection Time    01/16/14  6:20 AM      Result Value Ref Range   Sodium 141  137 - 147 mEq/L   Potassium 4.4  3.7 - 5.3 mEq/L   Chloride 103  96 - 112 mEq/L   CO2 28  19 - 32 mEq/L   Glucose, Bld 93  70 - 99 mg/dL   BUN 5 (*) 6 - 23 mg/dL   Creatinine, Ser 1.08  0.50 - 1.35 mg/dL   Calcium 9.7  8.4 - 10.5 mg/dL   Total Protein 6.4  6.0 - 8.3 g/dL   Albumin 3.7  3.5 - 5.2 g/dL   AST 23  0 - 37 U/L   ALT 24  0 - 53 U/L   Alkaline Phosphatase 39  39 - 117 U/L   Total Bilirubin 0.5  0.3 - 1.2 mg/dL   GFR calc non Af Amer >90  >90 mL/min   GFR calc Af Amer >90  >90 mL/min   Comment: (NOTE)     The eGFR has been calculated using the CKD EPI equation.     This calculation has not been validated in all clinical situations.     eGFR's persistently <90 mL/min signify possible Chronic Kidney     Disease.     Performed at [facility]    Physical Findings: AIMS: Facial and Oral Movements Muscles of Facial Expression: None, normal Lips and Perioral Area: None, normal Jaw: None,  normal Tongue: None, normal,Extremity Movements Upper (arms, wrists, hands, fingers): None, normal Lower (legs, knees, ankles, toes): None, normal, Trunk Movements Neck, shoulders, hips: None, normal, Overall Severity Severity of abnormal movements (highest score from questions above): None, normal Incapacitation due to abnormal movements: None, normal Patient's awareness of  abnormal movements (rate only patient's report): No Awareness, Dental Status Current problems with teeth and/or dentures?: No Does patient usually wear dentures?: No  CIWA:  CIWA-Ar Total: 2 COWS:  COWS Total Score: 3  Treatment Plan Summary: Daily contact with patient to assess and evaluate symptoms and progress in treatment Medication management  Plan: Treatment Plan/Recommendations:   1. Continue crisis management and stabilization. 2. Medication management to reduce current symptoms to base line and improve the patient's overall level of functioning. Monitor for adverse affects and provide Zofran PRN for vomiting.  3. Treat health problems as indicated. 4. Develop treatment plan to decrease risk of relapse upon discharge and to reduce the need for readmission. 5. Psycho-social education regarding relapse prevention and self care. 6. Health care follow up as needed for medical problems. 7.  Continue  Cogentin $RemoveB'1mg'dbBROZAz$  po BID for possible EPS 9.  Continue Lowered dose  Latuda to $RemoveB'40mg'DoCricfi$  po qd due to increased nausea and vomiting--Minimal nausea reported 10.Disposition plans are in progress and will discuss the case in the morning the case manager and the treatment team regarding appropriate disposition plans  Medical Decision Making Problem Points:  Established problem, worsening (2), New problem, with no additional work-up planned (3), Review of last therapy session (1) and Review of psycho-social stressors (1) Data Points:  Review or order clinical lab tests (1) Review or order medicine tests (1) Review of  medication regiment & side effects (2) Review of new medications or change in dosage (2)  I certify that inpatient services furnished can reasonably be expected to improve the patient's condition.   Reviewed the information documented and agree with the treatment plan.  Knox Royalty  PMHNP 01/19/2014 4:00 PM

## 2014-01-20 NOTE — B[facility] Group Notes (Signed)
[facility] Group Notes: (Clinical Social Work)   01/20/2014      Type of Therapy:  Group Therapy   Participation Level:  Did Not Attend    Selmer Dominion, LCSW 01/20/2014, 3:46 PM

## 2014-01-20 NOTE — Progress Notes (Signed)
Did not attend group 

## 2014-01-20 NOTE — Progress Notes (Addendum)
Brian [name] is seen OOB UAL on the 500 hall today...tolerated fair. Brian [name] is quiet, Brian [name] avoids eye contact and Brian [name] remains sad and flat and  Disconnected.    A Brian [name] attends Brian [name] groups but does not engage in the group discussions. Brian [name] deneis SI, HI. Brian [name] denies active hallucinations. Brian [name] says over and over " Brian [name] just want to go lay down...".   R Brian [name] takes  Brian [name] medications  according to MD order. POC in place and therapeutic relationship is fostered.

## 2014-01-20 NOTE — Progress Notes (Signed)
Patient has been up and active on the unit, attended group this evening and has voiced no complaints. Patient currently denies having pain, -si/hi/a/v hall. Support and encouragement offered, safety maintained on unit, will continue to monitor.  

## 2014-01-20 NOTE — Progress Notes (Signed)
Psychoeducational Group Note  Date:  01/20/2014 Time:  1015  Group Topic/Focus:  Making Healthy Choices:   The focus of this group is to help patients identify negative/unhealthy choices they were using prior to admission and identify positive/healthier coping strategies to replace them upon discharge.  Participation Level:  Active  Participation Quality:  Appropriate  Affect:  Appropriate  Cognitive:  Oriented  Insight:  Improving  Engagement in Group:  Engaged   Additional Comments:  Pt has attended the group and participated.   Kimmora Risenhoover A 01/20/2014  

## 2014-01-20 NOTE — Progress Notes (Signed)
Patient ID: Brian [name], male   DOB: Jun 24, 1986, 28 y.o.   MRN: [id number] [facility] MD Progress Note  01/20/2014 11:52 AM Isaul Landi  MRN:  [id number]  Subjective:  Assessment completed in patient's  room his is in bed sleeping (1150 am).  Tells me he is feeling "very depressed today" Depression 8/10 Anxiety 8/10   Denies SI,HI,H.. Slept poorly last night, appetite is improving  Discharge plan remains  to live with his mother in [place] and continue with [place].   He tells me he attended group yesterday and plans to get up for lunch.  Diagnosis:   DSM5: Schizophrenia Disorders:  Schizophrenia (295.7) Obsessive-Compulsive Disorders:   Trauma-Stressor Disorders:   Substance/Addictive Disorders:  Alcohol Related Disorder - Severe (303.90), Cannabis Use Disorder - Severe (304.30) and Opioid Disorder - Severe (304.00) Depressive Disorders:   Total Time spent with patient: 30 minutes  Axis I: Schizoaffective Disorder, Substance Abuse and Substance Induced Mood Disorder  ADL's:  Impaired  Sleep: Fair  Appetite:  Fair  Suicidal Ideation:  Denies presently Homicidal Ideation:  denied AEB (as evidenced by):  Psychiatric Specialty Exam: Physical Exam  Constitutional: He is oriented to person, place, and time. He appears well-developed and well-nourished.  HENT:  Head: Normocephalic.  Neck: Normal range of motion. Neck supple.  Respiratory: Effort normal.  Musculoskeletal: Normal range of motion.  Neurological: He is alert and oriented to person, place, and time.  Skin: Skin is warm and dry.    ROS  Blood pressure 110/80, pulse 92, temperature 98 F (36.7 C), temperature source Oral, resp. rate 18, height $RemoveBe'5\' 11"'zqcVDVTzz$  (1.803 m), weight 108.863 kg (240 lb), SpO2 99.00%.Body mass index is 33.49 kg/(m^2).  General Appearance: Guarded  Eye Contact::  Fair  Speech: slow  Volume:  Decreased  Mood-depressed  Affect:  Congruent and Depressed  Thought Process:  Coherent and Goal  Directed  Orientation:  Full (Time, Place, and Person)  Thought Content:  congruent  Suicidal Thoughts:  Denies presently   Homicidal Thoughts:  No  Memory:  Immediate;   Fair  Judgement:  Intact  Insight:  Fair  Psychomotor Activity:  Psychomotor Retardation  Concentration:  Fair  Recall:  [employer] of Knowledge:Fair  Language: Fair  Akathisia:  NA  Handed:  Right  AIMS (if indicated):     Assets:  Communication Skills Desire for Improvement Financial Resources/Insurance Housing Leisure Time Physical Health Resilience Social Support  Sleep:  Number of Hours: 6   Musculoskeletal: Strength & Muscle Tone: within normal limits Gait & Station: normal Patient leans: N/A  Current Medications: Current Facility-Administered Medications  Medication Dose Route Frequency Provider Last Rate Last Dose  . acetaminophen (TYLENOL) tablet 650 mg  650 mg Oral Q6H PRN Delfin Gant, NP      . alum & mag hydroxide-simeth (MAALOX/MYLANTA) 200-200-20 MG/5ML suspension 30 mL  30 mL Oral PRN Delfin Gant, NP      . cloNIDine (CATAPRES) tablet 0.1 mg  0.1 mg Oral BID BM & HS PRN Durward Parcel, MD      . divalproex (DEPAKOTE) DR tablet 1,500 mg  1,500 mg Oral QHS Delfin Gant, NP   1,500 mg at 01/15/14 2134  . FLUoxetine (PROZAC) capsule 20 mg  20 mg Oral q morning - 10a Delfin Gant, NP   20 mg at 01/15/14 1210  . hydrOXYzine (ATARAX/VISTARIL) tablet 25 mg  25 mg Oral Q6H PRN Delfin Gant, NP      . ibuprofen (  ADVIL,MOTRIN) tablet 600 mg  600 mg Oral Q8H PRN Delfin Gant, NP      . LORazepam (ATIVAN) tablet 0-4 mg  0-4 mg Oral Q12H Delfin Gant, NP   1 mg at 01/15/14 1710  . LORazepam (ATIVAN) tablet 1 mg  1 mg Oral Q8H PRN Delfin Gant, NP   1 mg at 01/13/14 2048  . lurasidone (LATUDA) tablet 80 mg  80 mg Oral Q breakfast Durward Parcel, MD   80 mg at 01/16/14 6378  . magnesium hydroxide (MILK OF MAGNESIA) suspension 30 mL  30  mL Oral Daily PRN Delfin Gant, NP      . nicotine (NICODERM CQ - dosed in mg/24 hours) patch 21 mg  21 mg Transdermal Daily Delfin Gant, NP   21 mg at 01/16/14 0800  . ondansetron (ZOFRAN) tablet 4 mg  4 mg Oral Q8H PRN Delfin Gant, NP   4 mg at 01/15/14 1533  . thiamine (VITAMIN B-1) tablet 100 mg  100 mg Oral Daily Delfin Gant, NP   100 mg at 01/16/14 5885  . traZODone (DESYREL) tablet 100 mg  100 mg Oral QHS Delfin Gant, NP   100 mg at 01/15/14 2133    Lab Results:  Results for orders placed during the hospital encounter of 01/13/14 (from the past 48 hour(s))  VALPROIC ACID LEVEL     Status: Abnormal   Collection Time    01/16/14  6:20 AM      Result Value Ref Range   Valproic Acid Lvl 42.3 (*) 50.0 - 100.0 ug/mL   Comment: Performed at [place] PANEL     Status: Abnormal   Collection Time    01/16/14  6:20 AM      Result Value Ref Range   Sodium 141  137 - 147 mEq/L   Potassium 4.4  3.7 - 5.3 mEq/L   Chloride 103  96 - 112 mEq/L   CO2 28  19 - 32 mEq/L   Glucose, Bld 93  70 - 99 mg/dL   BUN 5 (*) 6 - 23 mg/dL   Creatinine, Ser 1.08  0.50 - 1.35 mg/dL   Calcium 9.7  8.4 - 10.5 mg/dL   Total Protein 6.4  6.0 - 8.3 g/dL   Albumin 3.7  3.5 - 5.2 g/dL   AST 23  0 - 37 U/L   ALT 24  0 - 53 U/L   Alkaline Phosphatase 39  39 - 117 U/L   Total Bilirubin 0.5  0.3 - 1.2 mg/dL   GFR calc non Af Amer >90  >90 mL/min   GFR calc Af Amer >90  >90 mL/min   Comment: (NOTE)     The eGFR has been calculated using the CKD EPI equation.     This calculation has not been validated in all clinical situations.     eGFR's persistently <90 mL/min signify possible Chronic Kidney     Disease.     Performed at [facility]    Physical Findings: AIMS: Facial and Oral Movements Muscles of Facial Expression: None, normal Lips and Perioral Area: None, normal Jaw: None, normal Tongue: None, normal,Extremity  Movements Upper (arms, wrists, hands, fingers): None, normal Lower (legs, knees, ankles, toes): None, normal, Trunk Movements Neck, shoulders, hips: None, normal, Overall Severity Severity of abnormal movements (highest score from questions above): None, normal Incapacitation due to abnormal movements: None, normal Patient's awareness of abnormal  movements (rate only patient's report): No Awareness, Dental Status Current problems with teeth and/or dentures?: No Does patient usually wear dentures?: No  CIWA:  CIWA-Ar Total: 2 COWS:  COWS Total Score: 3  Treatment Plan Summary: Daily contact with patient to assess and evaluate symptoms and progress in treatment Medication management  Plan: Treatment Plan/Recommendations:   1. Continue crisis management and stabilization. 2. Medication management to reduce current symptoms to base line and improve the patient's overall level of functioning.      -Monitor for adverse affects and provide Zofran PRN for vomiting  (denies nausea and vomiting)  3. Treat health problems as indicated. 4. Develop treatment plan to decrease risk of relapse upon discharge and to reduce the need for readmission. 5. Psycho-social education regarding relapse prevention and self care. 6. Health care follow up as needed for medical problems. 7.  Continue  Cogentin 86m po BID for possible EPS 9.  Continue Lowered dose  Latuda to 433mpo qd due to increased nausea and vomiting- (appetite is improving, denies nausea) 10.Disposition plans are in progress and will discuss the case in the morning the case manager and the treatment team regarding appropriate disposition plans  Medical Decision Making Problem Points:  Established problem, worsening (2), New problem, with no additional work-up planned (3), Review of last therapy session (1) and Review of psycho-social stressors (1) Data Points:  Review or order clinical lab tests (1) Review or order medicine tests (1) Review  of medication regiment & side effects (2) Review of new medications or change in dosage (2)  I certify that inpatient services furnished can reasonably be expected to improve the patient's condition.   Reviewed the information documented and agree with the treatment plan.  MaKnox RoyaltyPMHNP 01/20/2014 11:52 AM

## 2014-01-20 NOTE — Progress Notes (Signed)
Psychoeducational Group Note  Date: 01/20/2014 Time: 0930 Group Topic/Focus:  Gratefulness:  The focus of this group is to help patients identify what two things they are most grateful for in their lives. What helps ground them and to center them on their work to their recovery.  Participation Level:  Active   Participation Quality:  Appropriate  Affect:  Appropriate  Cognitive:  Oriented  Insight:  Improving  Engagement in Group:  Engaged  Additional Comments:  Pt attended the group, participated and engaged.  Amari Burnsworth A   

## 2014-01-21 MED ORDER — PANTOPRAZOLE SODIUM 40 MG PO TBEC
40.0000 mg | DELAYED_RELEASE_TABLET | Freq: Every day | ORAL | Status: DC
  Administered 2014-01-21 – 2014-01-22 (×2): 40 mg via ORAL
  Filled 2014-01-21 (×4): qty 1

## 2014-01-21 MED ORDER — QUETIAPINE FUMARATE 100 MG PO TABS
100.0000 mg | ORAL_TABLET | Freq: Two times a day (BID) | ORAL | Status: DC
  Administered 2014-01-21 – 2014-01-22 (×2): 100 mg via ORAL
  Filled 2014-01-21 (×6): qty 1

## 2014-01-21 NOTE — Progress Notes (Signed)
Patient ID: Brian [name], male   DOB: [birth year], 28 y.o.   MRN: [id number] [facility] MD Progress Note  01/21/2014 12:46 PM Brian [name]  MRN:  [id number]  Subjective:  Met with patient who was sleeping in his room. He wants to go home today. Continued to report nausea. Anette Guarneri was D/C'd along with extra Ativan due to sedation. Will initiate Serouqel 133m po BID.  Diagnosis:   DSM5: Schizophrenia Disorders:  Schizophrenia (295.7) Obsessive-Compulsive Disorders:   Trauma-Stressor Disorders:   Substance/Addictive Disorders:  Alcohol Related Disorder - Severe (303.90), Cannabis Use Disorder - Severe (304.30) and Opioid Disorder - Severe (304.00) Depressive Disorders:   Total Time spent with patient: 30 minutes  Axis I: Schizoaffective Disorder, Substance Abuse and Substance Induced Mood Disorder  ADL's:  Impaired  Sleep: Fair  Appetite:  Fair  Suicidal Ideation:  Denies presently Homicidal Ideation:  denied AEB (as evidenced by):  Psychiatric Specialty Exam: Physical Exam  Constitutional: He is oriented to person, place, and time. He appears well-developed and well-nourished.  HENT:  Head: Normocephalic.  Neck: Normal range of motion. Neck supple.  Respiratory: Effort normal.  Musculoskeletal: Normal range of motion.  Neurological: He is alert and oriented to person, place, and time.  Skin: Skin is warm and dry.    ROS  Blood pressure 110/80, pulse 92, temperature 98 F (36.7 C), temperature source Oral, resp. rate 18, height 5' 11"  (1.803 m), weight 108.863 kg (240 lb), SpO2 99.00%.Body mass index is 33.49 kg/(m^2).  General Appearance: Guarded  Eye Contact::  Fair  Speech: slow  Volume:  Decreased  Mood-depressed  Affect:  Congruent and Depressed  Thought Process:  Coherent and Goal Directed  Orientation:  Full (Time, Place, and Person)  Thought Content:  congruent  Suicidal Thoughts:  Denies presently   Homicidal Thoughts:  No  Memory:  Immediate;   Fair  Judgement:   Intact  Insight:  Fair  Psychomotor Activity:  Psychomotor Retardation  Concentration:  Fair  Recall:  [employer] Knowledge:Fair  Language: Fair  Akathisia:  NA  Handed:  Right  AIMS (if indicated):     Assets:  Communication Skills Desire for Improvement Financial Resources/Insurance Housing Leisure Time Physical Health Resilience Social Support  Sleep:  Number of Hours: 6   Musculoskeletal: Strength & Muscle Tone: within normal limits Gait & Station: normal Patient leans: N/A  Current Medications: Current Facility-Administered Medications  Medication Dose Route Frequency Provider Last Rate Last Dose  . acetaminophen (TYLENOL) tablet 650 mg  650 mg Oral Q6H PRN JDelfin Gant NP      . alum & mag hydroxide-simeth (MAALOX/MYLANTA) 200-200-20 MG/5ML suspension 30 mL  30 mL Oral PRN JDelfin Gant NP      . cloNIDine (CATAPRES) tablet 0.1 mg  0.1 mg Oral BID BM & HS PRN JDurward Parcel MD      . divalproex (DEPAKOTE) DR tablet 1,500 mg  1,500 mg Oral QHS JDelfin Gant NP   1,500 mg at 01/15/14 2134  . FLUoxetine (PROZAC) capsule 20 mg  20 mg Oral q morning - 10a JDelfin Gant NP   20 mg at 01/15/14 1210  . hydrOXYzine (ATARAX/VISTARIL) tablet 25 mg  25 mg Oral Q6H PRN JDelfin Gant NP      . ibuprofen (ADVIL,MOTRIN) tablet 600 mg  600 mg Oral Q8H PRN JDelfin Gant NP      . LORazepam (ATIVAN) tablet 0-4 mg  0-4 mg Oral Q12H JDelfin Gant NP  1 mg at 01/15/14 1710  . LORazepam (ATIVAN) tablet 1 mg  1 mg Oral Q8H PRN Delfin Gant, NP   1 mg at 01/13/14 2048  . lurasidone (LATUDA) tablet 80 mg  80 mg Oral Q breakfast Durward Parcel, MD   80 mg at 01/16/14 5681  . magnesium hydroxide (MILK OF MAGNESIA) suspension 30 mL  30 mL Oral Daily PRN Delfin Gant, NP      . nicotine (NICODERM CQ - dosed in mg/24 hours) patch 21 mg  21 mg Transdermal Daily Delfin Gant, NP   21 mg at 01/16/14 0800  .  ondansetron (ZOFRAN) tablet 4 mg  4 mg Oral Q8H PRN Delfin Gant, NP   4 mg at 01/15/14 1533  . thiamine (VITAMIN B-1) tablet 100 mg  100 mg Oral Daily Delfin Gant, NP   100 mg at 01/16/14 2751  . traZODone (DESYREL) tablet 100 mg  100 mg Oral QHS Delfin Gant, NP   100 mg at 01/15/14 2133    Lab Results:  Results for orders placed during the hospital encounter of 01/13/14 (from the past 48 hour(s))  VALPROIC ACID LEVEL     Status: Abnormal   Collection Time    01/16/14  6:20 AM      Result Value Ref Range   Valproic Acid Lvl 42.3 (*) 50.0 - 100.0 ug/mL   Comment: Performed at [place] PANEL     Status: Abnormal   Collection Time    01/16/14  6:20 AM      Result Value Ref Range   Sodium 141  137 - 147 mEq/L   Potassium 4.4  3.7 - 5.3 mEq/L   Chloride 103  96 - 112 mEq/L   CO2 28  19 - 32 mEq/L   Glucose, Bld 93  70 - 99 mg/dL   BUN 5 (*) 6 - 23 mg/dL   Creatinine, Ser 1.08  0.50 - 1.35 mg/dL   Calcium 9.7  8.4 - 10.5 mg/dL   Total Protein 6.4  6.0 - 8.3 g/dL   Albumin 3.7  3.5 - 5.2 g/dL   AST 23  0 - 37 U/L   ALT 24  0 - 53 U/L   Alkaline Phosphatase 39  39 - 117 U/L   Total Bilirubin 0.5  0.3 - 1.2 mg/dL   GFR calc non Af Amer >90  >90 mL/min   GFR calc Af Amer >90  >90 mL/min   Comment: (NOTE)     The eGFR has been calculated using the CKD EPI equation.     This calculation has not been validated in all clinical situations.     eGFR's persistently <90 mL/min signify possible Chronic Kidney     Disease.     Performed at [facility]    Physical Findings: AIMS: Facial and Oral Movements Muscles of Facial Expression: None, normal Lips and Perioral Area: None, normal Jaw: None, normal Tongue: None, normal,Extremity Movements Upper (arms, wrists, hands, fingers): None, normal Lower (legs, knees, ankles, toes): None, normal, Trunk Movements Neck, shoulders, hips: None, normal, Overall  Severity Severity of abnormal movements (highest score from questions above): None, normal Incapacitation due to abnormal movements: None, normal Patient's awareness of abnormal movements (rate only patient's report): No Awareness, Dental Status Current problems with teeth and/or dentures?: No Does patient usually wear dentures?: No  CIWA:  CIWA-Ar Total: 2 COWS:  COWS Total Score: 3  Treatment  Plan Summary: Daily contact with patient to assess and evaluate symptoms and progress in treatment Medication management  Plan: 1. D/C latuda. 2. Started Seroquel 123m po BID. 3. D/C extra Ativan. Treatment Plan/Recommendations:   1. Continue crisis management and stabilization. 2. Medication management to reduce current symptoms to base line and improve the patient's overall level of functioning.      -Monitor for adverse affects and provide Zofran PRN for vomiting  (denies nausea and vomiting)  3. Treat health problems as indicated. 4. Develop treatment plan to decrease risk of relapse upon discharge and to reduce the need for readmission. 5. Psycho-social education regarding relapse prevention and self care. 6. Health care follow up as needed for medical problems. 7.  Continue  Cogentin 149mpo BID for possible EPS 9.  Continue Lowered dose  Latuda to 4028mo qd due to increased nausea and vomiting- (appetite is improving, denies nausea) 10.Disposition plans are in progress and will discuss the case in the morning the case manager and the treatment team regarding appropriate disposition plans  Medical Decision Making Problem Points:  Established problem, worsening (2), New problem, with no additional work-up planned (3), Review of last therapy session (1) and Review of psycho-social stressors (1) Data Points:  Review or order clinical lab tests (1) Review or order medicine tests (1) Review of medication regiment & side effects (2) Review of new medications or change in dosage (2)  I  certify that inpatient services furnished can reasonably be expected to improve the patient's condition.    NeiMarlane Hatcherashburn RPAC 2:04 PM 01/21/2014  Reviewed the information documented and agree with the treatment plan.  JanParke Simmersnnalagadda 01/21/2014 2:05 PM

## 2014-01-21 NOTE — Progress Notes (Signed)
Patient has been in the bed resting and sleeping and did not attend group this evening. Patient got up later and was in the dayroom watching tv. He was compliant with his medications. He asked Probation officer if he was discharging on Dietitian encouraged him to speak with his doctor concerning this. He denies si/hi/a/v hallucinations. Safety maintained on unit.

## 2014-01-21 NOTE — Progress Notes (Signed)
Patient ID: Brian [name], male   DOB: [birth year], 28 y.o.   MRN: [id number] D: Pt. Lying in bed, denies SHI, forward little. A: Writer reviewed medications, and administration times, encouraged group. Staff will monitor q50min for safety. R: pt. Is safe on the unit, did not attend group.

## 2014-01-21 NOTE — Progress Notes (Signed)
D: Patient denies SI/HI and auditory and visual hallucinations. The patient has an irritable mood and affect. The patient has been having minimal interactions within the milieu and has been isolating to his room at times. The patient states that he is "ready to go home" and that he has "learned all he can" from being here.  A: Patient given emotional support from RN. Patient encouraged to come to staff with concerns and/or questions. Patient's medication routine continued. Patient's orders and plan of care reviewed.  R: Patient remains appropriate and safe. Will continue to monitor patient q15 minutes for safety.

## 2014-01-22 MED ORDER — FLUOXETINE HCL 20 MG PO CAPS: 20.0000 mg | ORAL_CAPSULE | Freq: Every morning | ORAL | Status: DC

## 2014-01-22 MED ORDER — QUETIAPINE FUMARATE 100 MG PO TABS: 100.0000 mg | ORAL_TABLET | Freq: Two times a day (BID) | ORAL | Status: DC

## 2014-01-22 MED ORDER — DIVALPROEX SODIUM 500 MG PO DR TAB: 1500.0000 mg | DELAYED_RELEASE_TABLET | Freq: Every day | ORAL | Status: DC

## 2014-01-22 MED ORDER — PANTOPRAZOLE SODIUM 40 MG PO TBEC: 40.0000 mg | DELAYED_RELEASE_TABLET | Freq: Every day | ORAL | Status: DC

## 2014-01-22 MED ORDER — TRAZODONE HCL 100 MG PO TABS: 100.0000 mg | ORAL_TABLET | Freq: Every day | ORAL | Status: DC

## 2014-01-22 NOTE — Discharge Summary (Signed)
Physician Discharge Summary Note  Patient:  Brian [name] is an 28 y.o., male MRN:  [id number] DOB:  [birth year] Patient phone:  [phone] (home)  Patient address:   [place] [place] [address],  Total Time spent with patient: 30 minutes  Date of Admission:  01/13/2014 Date of Discharge: 01/22/2014  Reason for Admission:  psychosis  Discharge Diagnoses: Active Problems:   Schizoaffective disorder   Psychiatric Specialty Exam:  Please see D/C SRA Physical Exam  ROS  Blood pressure 123/86, pulse 96, temperature 97.3 F (36.3 C), temperature source Oral, resp. rate 16, height 5\' 11"  (1.803 m), weight 108.863 kg (240 lb), SpO2 99.00%.Body mass index is 33.49 kg/(m^2).    Past Psychiatric History:  Diagnosis: h/o schizoaffective d/o   Hospitalizations:[facility] 2 years ago   Outpatient Care: [facility]   Substance Abuse Care: unknown   Self-Mutilation:none   Suicidal Attempts:3x, OD on pain pills 2 years ago   Violent Behaviors: only if "anyone crosses me wrong"     DSM5: Discharge Diagnoses:  AXIS I: Schizoaffective Disorder and Substance Abuse  AXIS II: Deferred  AXIS III:  Past Medical History   Diagnosis  Date   .  PTSD (post-traumatic stress disorder)    .  Anxiety    .  Depression    .  Bipolar 1 disorder    .  Schizophrenia    .  Hepatitis C    .  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 He has a previous history of suicide attempt all by OD.   He was evaluated and felt to be in need of acute psychiatric hospitalization and crisis management.  Brian [name] was admitted to the adult unit. He was evaluated and his symptoms were identified. Medication management was discussed and initiated. He was oriented to the unit and encouraged to participate in unit programming. Medical problems were identified and treated appropriately. Home medication was restarted as needed.  He complained of nausea and vomiting on a daily basis and was treated with zofran. After several days on his usual medications and prn s for detox from alcohol and drugs, he was noticed to have a chronic rhythmic motion of his lower extremities as well as repetitive scratching of the back of his head. Brian [name] reported that these symptoms had been present since he had started the [place] 3 years ago. He also noted that the nausea and vomiting had started about the same time.  He was started on Cogentin 1mg  po BID for possible EPS vs TD vs. Akathesia. He was slowly titrated off of his Latuda when the nausea did not improve with reduced doses and Protonix.        Seroquel was initiated at 100mg  po BID. Brian [name] did not have any reports of side effects.         The patient was evaluated each day by a clinical provider to ascertain the patient's response to treatment.  Improvement was noted by the patient's report of decreasing symptoms, improved sleep and appetite, affect, medication tolerance, behavior, and participation  in unit programming.  He was asked each day to complete a self inventory noting mood, mental status, pain, new symptoms, anxiety and concerns.         He responded well to medication and being in a therapeutic and supportive environment. Positive and appropriate behavior was noted and the patient was motivated for recovery.  Brian [name] attended very few of the groups on the unit and preferred to isolate and sleep whenever he could  Coping skills, problem solving as well as relaxation therapies were also part of the unit programming.  He was requesting discharge and not very motivated  to continue any further with medication management in the hospital. He preferred to follow up with his out patient provider.         By the day of discharge he was in much improved condition than upon admission.  Symptoms were reported as significantly decreased or resolved completely.  The patient denied SI/HI and voiced no AVH. He was advised to continue taking medication with a goal of continued improvement in mental health.          Brian [name] was discharged home with a plan to follow up as noted below.   Consults:  None  Significant Diagnostic Studies:  None  Discharge Vitals:   Blood pressure 123/86, pulse 96, temperature 97.3 F (36.3 C), temperature source Oral, resp. rate 16, height 5\' 11"  (1.803 m), weight 108.863 kg (240 lb), SpO2 99.00%. Body mass index is 33.49 kg/(m^2). Lab Results:   No results found for this or any previous visit (from the past 72 hour(s)).  Physical Findings: AIMS: Facial and Oral Movements Muscles of Facial Expression: None, normal Lips and Perioral Area: None, normal Jaw: None, normal Tongue: None, normal,Extremity Movements Upper (arms, wrists, hands, fingers): None, normal Lower (legs, knees, ankles, toes): None, normal, Trunk Movements Neck, shoulders, hips: None, normal, Overall Severity Severity of abnormal movements (highest score from questions above): None, normal Incapacitation due to abnormal movements: None, normal Patient's awareness of abnormal movements (rate only patient's report): No Awareness, Dental Status Current problems with teeth and/or dentures?: No Does patient usually wear dentures?: No  CIWA:  CIWA-Ar Total: 2 COWS:  COWS Total Score: 3  Psychiatric Specialty Exam: See Psychiatric Specialty Exam and Suicide Risk Assessment completed by Attending Physician prior to discharge.  Discharge destination:  Home  Is patient on multiple antipsychotic therapies at discharge:  No   Has Patient had three or more failed  trials of antipsychotic monotherapy by history:  No  Recommended Plan for Multiple Antipsychotic Therapies: NA  Discharge Instructions   Diet - low sodium heart healthy    Complete by:  As directed      Discharge instructions    Complete by:  As directed   Take all of your medications as directed. Be sure to keep all of your follow up appointments.  If you are unable to keep your follow up appointment, call your Doctor's office to let them know, and reschedule.  Make sure that you have enough medication to last until your appointment. Be sure to get plenty of rest. Going to bed at the same time each night will help. Try to avoid sleeping during the day.  Increase your activity as tolerated. Regular exercise will help you to sleep better and improve your mental health. Eating a heart healthy diet is recommended. Try to avoid salty or fried foods. Be sure to avoid all alcohol and illegal drugs.     Increase activity  slowly    Complete by:  As directed             Medication List    STOP taking these medications       cloNIDine 0.1 MG tablet  Commonly known as:  CATAPRES     hydrOXYzine 25 MG capsule  Commonly known as:  VISTARIL     LATUDA 20 MG Tabs  Generic drug:  Lurasidone HCl     LATUDA 80 MG Tabs tablet  Generic drug:  lurasidone      TAKE these medications     Indication   divalproex 500 MG DR tablet  Commonly known as:  DEPAKOTE  Take 3 tablets (1,500 mg total) by mouth at bedtime.   Indication:  Mood stabilization     FLUoxetine 20 MG capsule  Commonly known as:  PROZAC  Take 1 capsule (20 mg total) by mouth every morning.   Indication:  Depression     pantoprazole 40 MG tablet  Commonly known as:  PROTONIX  Take 1 tablet (40 mg total) by mouth daily.   Indication:  Gastroesophageal Reflux Disease     QUEtiapine 100 MG tablet  Commonly known as:  SEROQUEL  Take 1 tablet (100 mg total) by mouth 2 (two) times daily.   Indication:  Schizophrenia      traZODone 100 MG tablet  Commonly known as:  DESYREL  Take 1 tablet (100 mg total) by mouth at bedtime.   Indication:  Trouble Sleeping           Follow-up Information   Follow up with [facility] On 01/25/2014. (Walk in between 8AM - 5PM Monday through Friday for medication management/counseling)    Contact information:   [address], [place] [address]  Phone: [phone]       Follow-up recommendations:   Activities: Resume activity as tolerated. Diet: Heart healthy low sodium diet Tests: Follow up testing will be determined by your out patient provider.   Comments:  Lovett will need a follow up Depakote level in 2 weeks.  Total Discharge Time:  Greater than 30 minutes.  Signed:Neil T. Mashburn RPAC 11:45 AM 01/22/2014   Patient was seen face-to-face for psychiatric evaluation, suicide risk assessment and case discussed with her treatment team on physician extender. Make appropriate disposition plan and  reviewed the information documented and agree with the treatment plan.  Parke Simmers Dajohn Ellender 01/23/2014 1:42 PM

## 2014-01-22 NOTE — Progress Notes (Signed)
[facility] - 0900  The focus of this group is to educate the patient on the purpose and policies of crisis stabilization and provide a format to answer questions about their admission.  The group details unit policies and expectations of patients while admitted.  Patient did not attend. 

## 2014-01-22 NOTE — Progress Notes (Signed)
Pt d/c. avs gone over. Pt expressed understanding. Denies si/hi/avh. Denies pain. Pt stating he is going to catch the bus. Pt stated he had belongings but no locker documented. Pt stated " it is not a big deal, ill just by new shoes."

## 2014-01-22 NOTE — Progress Notes (Signed)
[facility] Adult Case Management Discharge Plan :  Will you be returning to the same living situation after discharge: Yes,  Patient is returning to his home. At discharge, do you have transportation home?:Yes,  Patient to arrange transporttion home. Do you have the ability to pay for your medications:No.  Patient needs assistance with indigent medications   Release of information consent forms completed and in the chart;  Patient's signature needed at discharge.  Patient to Follow up at: Follow-up Information   Follow up with [facility] On 01/25/2014. (Walk in between 8AM - 5PM Monday through Friday for medication management/counseling)    Contact information:   [address], [place] [address]  Phone: [phone]       Patient denies SI/HI: Patient no longer endorsing SI/HI or other thoughts of self harm.   Safety Planning and Suicide Prevention discussed:  .Reviewed with all patients during discharge planning group  [place] 01/22/2014, 3:00 PM

## 2014-01-22 NOTE — [facility] Suicide Risk Assessment (Signed)
   Demographic Factors:  Male, Caucasian, Low socioeconomic status and Unemployed  Total Time spent with patient: 30 minutes  Psychiatric Specialty Exam: Physical Exam  ROS  Blood pressure 123/86, pulse 96, temperature 97.3 F (36.3 C), temperature source Oral, resp. rate 16, height 5\' 11"  (1.803 m), weight 108.863 kg (240 lb), SpO2 99.00%.Body mass index is 33.49 kg/(m^2).  General Appearance: Casual  Eye Contact::  Good  Speech:  Clear and Coherent  Volume:  Normal  Mood:  Euthymic  Affect:  Appropriate and Congruent  Thought Process:  Coherent and Goal Directed  Orientation:  Full (Time, Place, and Person)  Thought Content:  WDL  Suicidal Thoughts:  No  Homicidal Thoughts:  No  Memory:  Immediate;   Good Recent;   Good  Judgement:  Intact  Insight:  Fair  Psychomotor Activity:  Normal  Concentration:  Good  Recall:  Good  Fund of Knowledge:Good  Language: Good  Akathisia:  NA  Handed:  Right  AIMS (if indicated):     Assets:  Communication Skills Desire for Improvement Financial Resources/Insurance Housing Leisure Time Physical Health Social Support Talents/Skills  Sleep:  Number of Hours: 5.75    Musculoskeletal: Strength & Muscle Tone: within normal limits Gait & Station: normal Patient leans: N/A   Mental Status Per Nursing Assessment::   On Admission:  Suicidal ideation indicated by patient;Suicide plan;Self-harm thoughts  Current Mental Status by Physician: NA  Loss Factors: NA  Historical Factors: NA  Risk Reduction Factors:   Sense of responsibility to family, Religious beliefs about death, Living with another person, especially a relative, Positive social support, Positive therapeutic relationship and Positive coping skills or problem solving skills  Continued Clinical Symptoms:  Alcohol/Substance Abuse/Dependencies Schizophrenia:   Paranoid or undifferentiated type Previous Psychiatric Diagnoses and Treatments  Cognitive Features That  Contribute To Risk:  Polarized thinking    Suicide Risk:  Minimal: No identifiable suicidal ideation.  Patients presenting with no risk factors but with morbid ruminations; may be classified as minimal risk based on the severity of the depressive symptoms  Discharge Diagnoses:   AXIS I:  Schizoaffective Disorder and Substance Abuse AXIS II:  Deferred AXIS III:   Past Medical History  Diagnosis Date  . PTSD (post-traumatic stress disorder)   . Anxiety   . Depression   . Bipolar 1 disorder   . Schizophrenia   . Hepatitis C   . 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

## 2014-01-25 NOTE — Progress Notes (Signed)
Patient Discharge Instructions:  After Visit Summary (AVS):   Faxed to:  01/25/14 Discharge Summary Note:   Faxed to:  01/25/14 Psychiatric Admission Assessment Note:   Faxed to:  01/25/14 Suicide Risk Assessment - Discharge Assessment:   Faxed to:  01/25/14 Faxed/Sent to the Next Level Care provider:  01/25/14 Faxed to [facility] [place], 01/25/2014, 1:26 PM

## 2014-01-30 ENCOUNTER — Other Ambulatory Visit (HOSPITAL_COMMUNITY): Payer: Self-pay

## 2014-04-23 ENCOUNTER — Emergency Department (HOSPITAL_COMMUNITY)
Admission: EM | Admit: 2014-04-23 | Discharge: 2014-04-23 | Disposition: A | Discharge status: Home / Self Care | Payer: PRIVATE HEALTH INSURANCE | Attending: Emergency Medicine | Admitting: Emergency Medicine

## 2014-04-23 ENCOUNTER — Encounter (HOSPITAL_COMMUNITY): Payer: Self-pay | Admitting: Emergency Medicine

## 2014-04-23 DIAGNOSIS — F4310 Post-traumatic stress disorder, unspecified: Secondary | ICD-10-CM | POA: Insufficient documentation

## 2014-04-23 DIAGNOSIS — K089 Disorder of teeth and supporting structures, unspecified: Secondary | ICD-10-CM | POA: Insufficient documentation

## 2014-04-23 DIAGNOSIS — Z8619 Personal history of other infectious and parasitic diseases: Secondary | ICD-10-CM | POA: Insufficient documentation

## 2014-04-23 DIAGNOSIS — F17200 Nicotine dependence, unspecified, uncomplicated: Secondary | ICD-10-CM | POA: Insufficient documentation

## 2014-04-23 DIAGNOSIS — K029 Dental caries, unspecified: Secondary | ICD-10-CM | POA: Insufficient documentation

## 2014-04-23 DIAGNOSIS — F319 Bipolar disorder, unspecified: Secondary | ICD-10-CM | POA: Insufficient documentation

## 2014-04-23 DIAGNOSIS — Z79899 Other long term (current) drug therapy: Secondary | ICD-10-CM | POA: Insufficient documentation

## 2014-04-23 DIAGNOSIS — F411 Generalized anxiety disorder: Secondary | ICD-10-CM | POA: Insufficient documentation

## 2014-04-23 DIAGNOSIS — Z85828 Personal history of other malignant neoplasm of skin: Secondary | ICD-10-CM | POA: Insufficient documentation

## 2014-04-23 DIAGNOSIS — F209 Schizophrenia, unspecified: Secondary | ICD-10-CM | POA: Insufficient documentation

## 2014-04-23 MED ORDER — TRAMADOL HCL 50 MG PO TABS
50.0000 mg | ORAL_TABLET | Freq: Once | ORAL | Status: AC
Start: 2014-04-23 — End: 2014-04-23
  Administered 2014-04-23: 50 mg via ORAL
  Filled 2014-04-23: qty 1

## 2014-04-23 MED ORDER — TRAMADOL HCL 50 MG PO TABS: 50.0000 mg | ORAL_TABLET | Freq: Four times a day (QID) | ORAL | Status: DC | PRN

## 2014-04-23 NOTE — ED Notes (Signed)
Per pt, has dental pain.  Worse in last week.  Pending insurance

## 2014-04-23 NOTE — ED Provider Notes (Signed)
CSN: [id number]     Arrival date & time 04/23/14  1809 History  This chart was scribed for Junius Creamer, NP working with Mirna Mires, MD by Randa Evens, ED Scribe. This patient was seen in room [place] and the patient's care was started at 8:07 PM.    Chief Complaint  Patient presents with  . Dental Pain   The history is provided by the patient. No language interpreter was used.   HPI Comments: Brian [name] is a 28 y.o. male who presents to the Emergency Department complaining of lower left molar dental pain onset 2 weeks prior. He states the pain has recently worsened over the past week. He states that he has been taking Advil and Aleve with no relief. He states that he is waiting on his insurance to be approved to see the dentist.    Past Medical History  Diagnosis Date  . PTSD (post-traumatic stress disorder)   . Anxiety   . Depression   . Bipolar 1 disorder   . Schizophrenia   . Hepatitis C   . 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 Leg surgery     History reviewed. No pertinent family history. History  Substance Use Topics  . Smoking status: Current Every Day Smoker -- 1.00 packs/day    Types: Cigarettes  . Smokeless tobacco: Never Used  . Alcohol Use: Yes     Comment: 12-18 packs of beer/day    Review of Systems  Constitutional: Negative for fever and chills.  HENT: Positive for dental problem. Negative for ear pain and facial swelling.   Neurological: Negative for dizziness and headaches.  All other systems reviewed and are negative.   Allergies  Phenytoin sodium extended; Pineapple; and Risperdal  Home Medications   Prior to Admission medications   Medication Sig Start Date End Date Taking? Authorizing Provider  acetaminophen (TYLENOL) 500 MG tablet Take 2,000 mg by mouth every 8 (eight) hours as needed for moderate pain.   Yes Historical Provider, MD  cloNIDine (CATAPRES) 0.1 MG tablet Take 0.1 mg by  mouth daily.   Yes Historical Provider, MD  divalproex (DEPAKOTE) 500 MG DR tablet Take 3 tablets (1,500 mg total) by mouth at bedtime. 01/22/14  Yes Nena Polio, PA-C  FLUoxetine (PROZAC) 20 MG capsule Take 1 capsule (20 mg total) by mouth every morning. 01/22/14  Yes Nena Polio, PA-C  QUEtiapine (SEROQUEL) 100 MG tablet Take 1 tablet (100 mg total) by mouth 2 (two) times daily. 01/22/14  Yes Nena Polio, PA-C  traZODone (DESYREL) 100 MG tablet Take 1 tablet (100 mg total) by mouth at bedtime. 01/22/14  Yes Nena Polio, PA-C  traMADol (ULTRAM) 50 MG tablet Take 1 tablet (50 mg total) by mouth every 6 (six) hours as needed. 04/23/14   Garald Balding, NP   Triage Vitals: BP 111/69  Pulse 79  Temp(Src) 98.3 F (36.8 C) (Oral)  Resp 18  SpO2 99%  Physical Exam  Nursing note and vitals reviewed. Constitutional: He appears well-developed and well-nourished.  HENT:  Right Ear: External ear normal.  Left Ear: External ear normal.  Mouth/Throat:    Eyes: Pupils are equal, round, and reactive to light.  Cardiovascular: Normal rate.   Pulmonary/Chest: Effort normal.  Lymphadenopathy:    He has no cervical adenopathy.  Neurological: He is alert.  Skin: Skin is warm.    ED Course  Procedures (including critical care time) DIAGNOSTIC  STUDIES: Oxygen Saturation is 99% on RA, normal by my interpretation.    COORDINATION OF CARE: 8:19 PM-Discussed treatment plan which includes dental referral with pt at bedside and pt agreed to plan.     Labs Review Labs Reviewed - No data to display  Imaging Review No results found.   EKG Interpretation None      MDM   Final diagnoses:  Dental cavity      I personally performed the services described in this documentation, which was scribed in my presence. The recorded information has been reviewed and is accurate.    Garald Balding, NP 04/23/14 2039

## 2014-04-23 NOTE — Discharge Instructions (Signed)
Dental Caries °Dental caries (also called tooth decay) is the most common oral disease. It can occur at any age but is more common in children and young adults.  °HOW DENTAL CARIES DEVELOPS  °The process of decay begins when bacteria and foods (particularly sugars and starches) combine in your mouth to produce plaque. Plaque is a substance that sticks to the hard, outer surface of a tooth (enamel). The bacteria in plaque produce acids that attack enamel. These acids may also attack the root surface of a tooth (cementum) if it is exposed. Repeated attacks dissolve these surfaces and create holes in the tooth (cavities). If left untreated, the acids destroy the other layers of the tooth.  °RISK FACTORS °· Frequent sipping of sugary beverages.   °· Frequent snacking on sugary and starchy foods, especially those that easily get stuck in the teeth.   °· Poor oral hygiene.   °· Dry mouth.   °· Substance abuse such as methamphetamine abuse.   °· Broken or poor-fitting dental restorations.   °· Eating disorders.   °· Gastroesophageal reflux disease (GERD).   °· Certain radiation treatments to the head and neck. °SYMPTOMS °In the early stages of dental caries, symptoms are seldom present. Sometimes white, chalky areas may be seen on the enamel or other tooth layers. In later stages, symptoms may include: °· Pits and holes on the enamel. °· Toothache after sweet, hot, or cold foods or drinks are consumed. °· Pain around the tooth. °· Swelling around the tooth. °DIAGNOSIS  °Most of the time, dental caries is detected during a regular dental checkup. A diagnosis is made after a thorough medical and dental history is taken and the surfaces of your teeth are checked for signs of dental caries. Sometimes special instruments, such as lasers, are used to check for dental caries. Dental X-ray exams may be taken so that areas not visible to the eye (such as between the contact areas of the teeth) can be checked for cavities.    °TREATMENT  °If dental caries is in its early stages, it may be reversed with a fluoride treatment or an application of a remineralizing agent at the dental office. Thorough brushing and flossing at home is needed to aid these treatments. If it is in its later stages, treatment depends on the location and extent of tooth destruction:  °· If a small area of the tooth has been destroyed, the destroyed area will be removed and cavities will be filled with a material such as gold, silver amalgam, or composite resin.   °· If a large area of the tooth has been destroyed, the destroyed area will be removed and a cap (crown) will be fitted over the remaining tooth structure.   °· If the center part of the tooth (pulp) is affected, a procedure called a root canal will be needed before a filling or crown can be placed.   °· If most of the tooth has been destroyed, the tooth may need to be pulled (extracted). °HOME CARE INSTRUCTIONS °You can prevent, stop, or reverse dental caries at home by practicing good oral hygiene. Good oral hygiene includes: °· Thoroughly cleaning your teeth at least twice a day with a toothbrush and dental floss.   °· Using a fluoride toothpaste. A fluoride mouth rinse may also be used if recommended by your dentist or health care provider.   °· Restricting the amount of sugary and starchy foods and sugary liquids you consume.   °· Avoiding frequent snacking on these foods and sipping of these liquids.   °· Keeping regular visits with   a dentist for checkups and cleanings. PREVENTION   Practice good oral hygiene.  Consider a dental sealant. A dental sealant is a coating material that is applied by your dentist to the pits and grooves of teeth. The sealant prevents food from being trapped in them. It may protect the teeth for several years.  Ask about fluoride supplements if you live in a community without fluorinated water or with water that has a low fluoride content. Use fluoride supplements  as directed by your dentist or health care provider.  Allow fluoride varnish applications to teeth if directed by your dentist or health care provider. Document Released: 05/01/2002 Document Revised: 12/24/2013 Document Reviewed: 08/11/2012 [facility] Patient Information 2015 [place], [place]. This information is not intended to replace advice given to you by your health care provider. Make sure you discuss any questions you have with your health care provider. Call Dr. Geralynn Ochs in the morning tell them your are being referred through the ED they will try to get you seen in the next 24-48 hours

## 2014-04-30 NOTE — ED Provider Notes (Signed)
Medical screening examination/treatment/procedure(s) were performed by non-physician practitioner and as supervising physician I was immediately available for consultation/collaboration.     Mirna Mires, MD 04/30/14 [phone]

## 2014-08-29 ENCOUNTER — Emergency Department (HOSPITAL_COMMUNITY)
Admission: EM | Admit: 2014-08-29 | Discharge: 2014-08-29 | Disposition: A | Discharge status: Home / Self Care | Payer: PRIVATE HEALTH INSURANCE | Attending: Emergency Medicine | Admitting: Emergency Medicine

## 2014-08-29 ENCOUNTER — Encounter (HOSPITAL_COMMUNITY): Payer: Self-pay | Admitting: Emergency Medicine

## 2014-08-29 DIAGNOSIS — Y998 Other external cause status: Secondary | ICD-10-CM | POA: Insufficient documentation

## 2014-08-29 DIAGNOSIS — Y93E5 Activity, floor mopping and cleaning: Secondary | ICD-10-CM | POA: Insufficient documentation

## 2014-08-29 DIAGNOSIS — Z79899 Other long term (current) drug therapy: Secondary | ICD-10-CM | POA: Insufficient documentation

## 2014-08-29 DIAGNOSIS — Z23 Encounter for immunization: Secondary | ICD-10-CM | POA: Insufficient documentation

## 2014-08-29 DIAGNOSIS — F4310 Post-traumatic stress disorder, unspecified: Secondary | ICD-10-CM | POA: Insufficient documentation

## 2014-08-29 DIAGNOSIS — F209 Schizophrenia, unspecified: Secondary | ICD-10-CM | POA: Insufficient documentation

## 2014-08-29 DIAGNOSIS — Z8589 Personal history of malignant neoplasm of other organs and systems: Secondary | ICD-10-CM | POA: Insufficient documentation

## 2014-08-29 DIAGNOSIS — T23202A Burn of second degree of left hand, unspecified site, initial encounter: Secondary | ICD-10-CM | POA: Insufficient documentation

## 2014-08-29 DIAGNOSIS — Z720 Tobacco use: Secondary | ICD-10-CM | POA: Insufficient documentation

## 2014-08-29 DIAGNOSIS — X19XXXA Contact with other heat and hot substances, initial encounter: Secondary | ICD-10-CM | POA: Insufficient documentation

## 2014-08-29 DIAGNOSIS — Y92511 Restaurant or cafe as the place of occurrence of the external cause: Secondary | ICD-10-CM | POA: Insufficient documentation

## 2014-08-29 DIAGNOSIS — T23201A Burn of second degree of right hand, unspecified site, initial encounter: Secondary | ICD-10-CM

## 2014-08-29 DIAGNOSIS — F319 Bipolar disorder, unspecified: Secondary | ICD-10-CM | POA: Insufficient documentation

## 2014-08-29 DIAGNOSIS — F419 Anxiety disorder, unspecified: Secondary | ICD-10-CM | POA: Insufficient documentation

## 2014-08-29 DIAGNOSIS — Z8619 Personal history of other infectious and parasitic diseases: Secondary | ICD-10-CM | POA: Insufficient documentation

## 2014-08-29 MED ORDER — TRAMADOL HCL 50 MG PO TABS: 50.0000 mg | ORAL_TABLET | Freq: Four times a day (QID) | ORAL | Status: DC | PRN

## 2014-08-29 MED ORDER — BACITRACIN ZINC 500 UNIT/GM EX OINT: 1.0000 "application " | TOPICAL_OINTMENT | Freq: Two times a day (BID) | CUTANEOUS | Status: DC

## 2014-08-29 MED ORDER — TETANUS-DIPHTH-ACELL PERTUSSIS 5-2.5-18.5 LF-MCG/0.5 IM SUSP
0.5000 mL | Freq: Once | INTRAMUSCULAR | Status: AC
  Administered 2014-08-29: 0.5 mL via INTRAMUSCULAR
  Filled 2014-08-29: qty 0.5

## 2014-08-29 MED ORDER — HYDROCODONE-ACETAMINOPHEN 5-325 MG PO TABS
2.0000 | ORAL_TABLET | Freq: Once | ORAL | Status: AC
  Administered 2014-08-29: 2 via ORAL
  Filled 2014-08-29: qty 2

## 2014-08-29 NOTE — ED Provider Notes (Signed)
CSN: [id number]     Arrival date & time 08/29/14  2027 History   First MD Initiated Contact with Patient 08/29/14 2111    This chart was scribed for non-physician practitioner, Antonietta Breach, PA, working with Dr. Tanna Furry by Terressa Koyanagi, ED Scribe. This patient was seen in room WTR5/WTR5 and the patient's care was started at 9:53 PM.  Chief Complaint  Patient presents with  . Hand Burn   Patient is a 29 y.o. male presenting with burn. The history is provided by the patient. No language interpreter was used.  Burn Burn location:  Hand Hand burn location:  L hand Burn quality:  Intact blister and ruptured blister (Intact on PIP of the 2nd digit, proximal to the PIP of the 3rd digit, ruptured blister extending from PIP of the 4th digit to 5th digit on dorsum of hand) Time since incident:  2 hours Progression:  Unchanged Mechanism of burn:  Hot surface Incident location:  Work Relieved by:  None tried Worsened by:  Nothing tried Ineffective treatments:  None tried Tetanus status:  Unknown  PCP: No PCP Per Patient HPI Comments: Brian [name] is a 29 y.o. male, with PMH below, who presents to the Emergency Department complaining of burns to his left hand with associated drainage sustained today around 8PM while pt was cleaning a grill at [employer] and bumped his left hand on top of the grill. Pt also complains of associated burning pain to said area and rates it a 10 out of 10. Pt denies taking any measures to alleviate his Sx PTA to the ED. Pt is not sure if he is UTD on his tetanus shot.    Past Medical History  Diagnosis Date  . PTSD (post-traumatic stress disorder)   . Anxiety   . Depression   . Bipolar 1 disorder   . Schizophrenia   . Hepatitis C   . 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 Leg surgery     No family history on file. History  Substance Use Topics  . Smoking status: Current Every Day Smoker -- 1.00 packs/day     Types: Cigarettes  . Smokeless tobacco: Never Used  . Alcohol Use: Yes     Comment: 12-18 packs of beer/day    Review of Systems  Constitutional: Negative for fever and chills.  Musculoskeletal:       Pain to left hand at burn site.   Skin: Positive for wound (burn to left hand).  Psychiatric/Behavioral: Negative for confusion.  All other systems reviewed and are negative.   Allergies  Phenytoin sodium extended; Pineapple; and Risperdal  Home Medications   Prior to Admission medications   Medication Sig Start Date End Date Taking? Authorizing Provider  acetaminophen (TYLENOL) 500 MG tablet Take 2,000 mg by mouth every 8 (eight) hours as needed for moderate pain.    Historical Provider, MD  bacitracin ointment Apply 1 application topically 2 (two) times daily. 08/29/14   Antonietta Breach, PA-C  cloNIDine (CATAPRES) 0.1 MG tablet Take 0.1 mg by mouth daily.    Historical Provider, MD  divalproex (DEPAKOTE) 500 MG DR tablet Take 3 tablets (1,500 mg total) by mouth at bedtime. 01/22/14   Nena Polio, PA-C  FLUoxetine (PROZAC) 20 MG capsule Take 1 capsule (20 mg total) by mouth every morning. 01/22/14   Nena Polio, PA-C  QUEtiapine (SEROQUEL) 100 MG tablet Take 1 tablet (100 mg total) by mouth  2 (two) times daily. 01/22/14   Nena Polio, PA-C  traMADol (ULTRAM) 50 MG tablet Take 1 tablet (50 mg total) by mouth every 6 (six) hours as needed for severe pain. 08/29/14   Antonietta Breach, PA-C  traZODone (DESYREL) 100 MG tablet Take 1 tablet (100 mg total) by mouth at bedtime. 01/22/14   Nena Polio, PA-C   Triage Vitals: BP 155/98 mmHg  Pulse 84  Temp(Src) 98.1 F (36.7 C) (Oral)  Resp 18  SpO2 96%  Physical Exam  Constitutional: He is oriented to person, place, and time. He appears well-developed and well-nourished. No distress.  Nontoxic/nonseptic appearing  HENT:  Head: Normocephalic and atraumatic.  Eyes: Conjunctivae and EOM are normal. No scleral icterus.  Neck: Normal range of motion.  Neck supple. No tracheal deviation present.  Cardiovascular: Normal rate, regular rhythm and intact distal pulses.   Distal radial pulse 2+ in left upper extremity  Pulmonary/Chest: Effort normal. No respiratory distress.  Respirations even and unlabored  Musculoskeletal: Normal range of motion. He exhibits tenderness.       Left hand: He exhibits tenderness. He exhibits normal range of motion, no bony tenderness, normal capillary refill, no deformity and no swelling. Normal sensation noted. Normal strength noted.       Hands: Burn to dorsal aspect of L hand where indicated. Burn with ruptured blister at the 4th/5th digits of L hand. All burns c/w superficial partial thickness. No circumferential burns noted. No evidence of secondary infection. BSA <1%.  Neurological: He is alert and oriented to person, place, and time. He exhibits normal muscle tone. Coordination normal.  Sensation to light touch intact. Normal grip strength in left hand  Skin: Skin is warm and dry. No rash noted. He is not diaphoretic. No erythema. No pallor.  Psychiatric: He has a normal mood and affect. His behavior is normal.  Nursing note and vitals reviewed.   ED Course  Procedures (including critical care time) DIAGNOSTIC STUDIES: Oxygen Saturation is 96% on RA, adequate by my interpretation.    COORDINATION OF CARE: 9:57 PM-Discussed treatment plan which includes wound care, work note and referral with pt at bedside and pt agreed to plan.   Labs Review Labs Reviewed - No data to display  Imaging Review No results found.   EKG Interpretation None      MDM   Final diagnoses:  Burn of hand, right, second degree, initial encounter    29 year old male plans to the emergency department for further evaluation of burn to left hand. Burn consistent with superficial partial-thickness burn. There is a ruptured blister proximal to the 4th/5th digits. All other blisters intact. No evidence of secondary infection.  Patient neurovascularly intact. Have updated tetanus today and discussed wound care. Referral to Dr. Migdalia Dk of Plastics provided for follow-up. Return precautions provided and patient agreeable to plan with no unaddressed concerns.  I personally performed the services described in this documentation, which was scribed in my presence. The recorded information has been reviewed and is accurate.   Filed Vitals:   08/29/14 2053  BP: 155/98  Pulse: 84  Temp: 98.1 F (36.7 C)  TempSrc: Oral  Resp: 18  SpO2: 96%     Antonietta Breach, PA-C 08/29/14 2232  Tanna Furry, MD 09/09/14 2337

## 2014-08-29 NOTE — ED Notes (Signed)
Pt was cleaning grill at [employer] and bumped his L hand on the top of the grill. Pt has blistering burns on the top of his L hand. Pt A&Ox4.

## 2014-08-29 NOTE — Discharge Instructions (Signed)
Keep wound covered at all times until near complete healing. Apply bacitracin twice a day to prevent infection. Follow up with a plastic surgeon for burn care/wound recheck. Return to the ED as needed if symptoms worsen.  Burn Care Your skin is a natural barrier to infection. It is the largest organ of your body. Burns damage this natural protection. To help prevent infection, it is very important to follow your caregiver's instructions in the care of your burn. Burns are classified as:  First degree. There is only redness of the skin (erythema). No scarring is expected.  Second degree. There is blistering of the skin. Scarring may occur with deeper burns.  Third degree. All layers of the skin are injured, and scarring is expected. HOME CARE INSTRUCTIONS   Wash your hands well before changing your bandage.  Change your bandage as often as directed by your caregiver.  Remove the old bandage. If the bandage sticks, you may soak it off with cool, clean water.  Cleanse the burn thoroughly but gently with mild soap and water.  Pat the area dry with a clean, dry cloth.  Apply a thin layer of antibacterial cream to the burn.  Apply a clean bandage as instructed by your caregiver.  Keep the bandage as clean and dry as possible.  Elevate the affected area for the first 24 hours, then as instructed by your caregiver.  Only take over-the-counter or prescription medicines for pain, discomfort, or fever as directed by your caregiver. SEEK IMMEDIATE MEDICAL CARE IF:   You develop excessive pain.  You develop redness, tenderness, swelling, or red streaks near the burn.  The burned area develops yellowish-white fluid (pus) or a bad smell.  You have a fever. MAKE SURE YOU:   Understand these instructions.  Will watch your condition.  Will get help right away if you are not doing well or get worse. Document Released: 08/09/2005 Document Revised: 11/01/2011 Document Reviewed:  12/30/2010 [facility] Patient Information 2015 [place], [place]. This information is not intended to replace advice given to you by your health care provider. Make sure you discuss any questions you have with your health care provider.

## 2014-09-06 ENCOUNTER — Encounter (HOSPITAL_COMMUNITY): Payer: Self-pay | Admitting: Emergency Medicine

## 2014-09-06 ENCOUNTER — Emergency Department (HOSPITAL_COMMUNITY)
Admission: EM | Admit: 2014-09-06 | Discharge: 2014-09-06 | Disposition: A | Discharge status: Home / Self Care | Payer: PRIVATE HEALTH INSURANCE | Attending: Emergency Medicine | Admitting: Emergency Medicine

## 2014-09-06 DIAGNOSIS — Z720 Tobacco use: Secondary | ICD-10-CM | POA: Insufficient documentation

## 2014-09-06 DIAGNOSIS — F319 Bipolar disorder, unspecified: Secondary | ICD-10-CM | POA: Insufficient documentation

## 2014-09-06 DIAGNOSIS — Z5189 Encounter for other specified aftercare: Secondary | ICD-10-CM

## 2014-09-06 DIAGNOSIS — Z4800 Encounter for change or removal of nonsurgical wound dressing: Secondary | ICD-10-CM | POA: Insufficient documentation

## 2014-09-06 DIAGNOSIS — Z79899 Other long term (current) drug therapy: Secondary | ICD-10-CM | POA: Insufficient documentation

## 2014-09-06 DIAGNOSIS — Z8619 Personal history of other infectious and parasitic diseases: Secondary | ICD-10-CM | POA: Insufficient documentation

## 2014-09-06 DIAGNOSIS — Z859 Personal history of malignant neoplasm, unspecified: Secondary | ICD-10-CM | POA: Insufficient documentation

## 2014-09-06 DIAGNOSIS — F4310 Post-traumatic stress disorder, unspecified: Secondary | ICD-10-CM | POA: Insufficient documentation

## 2014-09-06 DIAGNOSIS — F419 Anxiety disorder, unspecified: Secondary | ICD-10-CM | POA: Insufficient documentation

## 2014-09-06 MED ORDER — TRAMADOL HCL 50 MG PO TABS: 50.0000 mg | ORAL_TABLET | Freq: Four times a day (QID) | ORAL | Status: DC | PRN

## 2014-09-06 MED ORDER — CEPHALEXIN 500 MG PO CAPS: 500.0000 mg | ORAL_CAPSULE | Freq: Four times a day (QID) | ORAL | Status: DC

## 2014-09-06 MED ORDER — SILVER SULFADIAZINE 1 % EX CREA
TOPICAL_CREAM | Freq: Once | CUTANEOUS | Status: AC
  Administered 2014-09-06: 23:00:00 via TOPICAL
  Filled 2014-09-06: qty 50

## 2014-09-06 NOTE — ED Notes (Signed)
Pt states he burned the top of his L hand a week ago. States he ran out of Tramadol and hand is hurting. Pt using antibiotic cream on hand. No pus drainage or swelling to wound. ROM intact to L hand.

## 2014-09-06 NOTE — Discharge Instructions (Signed)
Burn Care Your skin is a natural barrier to infection. It is the largest organ of your body. Burns damage this natural protection. To help prevent infection, it is very important to follow your caregiver's instructions in the care of your burn. Burns are classified as:  First degree. There is only redness of the skin (erythema). No scarring is expected.  Second degree. There is blistering of the skin. Scarring may occur with deeper burns.  Third degree. All layers of the skin are injured, and scarring is expected. HOME CARE INSTRUCTIONS   Wash your hands well before changing your bandage.  Change your bandage as often as directed by your caregiver.  Remove the old bandage. If the bandage sticks, you may soak it off with cool, clean water.  Cleanse the burn thoroughly but gently with mild soap and water.  Pat the area dry with a clean, dry cloth.  Apply a thin layer of antibacterial cream to the burn.  Apply a clean bandage as instructed by your caregiver.  Keep the bandage as clean and dry as possible.  Elevate the affected area for the first 24 hours, then as instructed by your caregiver.  Only take over-the-counter or prescription medicines for pain, discomfort, or fever as directed by your caregiver. SEEK IMMEDIATE MEDICAL CARE IF:   You develop excessive pain.  You develop redness, tenderness, swelling, or red streaks near the burn.  The burned area develops yellowish-white fluid (pus) or a bad smell.  You have a fever. MAKE SURE YOU:   Understand these instructions.  Will watch your condition.  Will get help right away if you are not doing well or get worse. Document Released: 08/09/2005 Document Revised: 11/01/2011 Document Reviewed: 12/30/2010 [employer] Patient Information 2015 [employer]. This information is not intended to replace advice given to you by your health care provider. Make sure you discuss any questions you have with your health care  provider.  

## 2014-09-06 NOTE — ED Provider Notes (Signed)
CSN: [id number]     Arrival date & time 09/06/14  2135 History  This chart was scribed for non-physician practitioner Charlann Lange, PA-C, working with Pamella Pert, MD by Randa Evens, ED Scribe. This patient was seen in room WTR6/WTR6 and the patient's care was started at 10:30 PM.     Chief Complaint  Patient presents with  . Hand Pain   The history is provided by the patient. No language interpreter was used.   HPI Comments: Brian [name] is a 29 y.o. male who presents to the Emergency Department complaining of left hand pain onset 1 week prior. Pt states that he burned the hand on the grill at [facility]. Pt states that he has associated drainage. Pt states that he has had a difficult time keeping the wound clean and dry due to having to wear gloves while at work. Pt states he was not able to go to his follow up due to not being paid yet. Pt states that he has been using peroxide, bacitracin ointment and neosporin that provides slight relief.  Denies fever, chills, nausea or vomiting.   Past Medical History  Diagnosis Date  . PTSD (post-traumatic stress disorder)   . Anxiety   . Depression   . Bipolar 1 disorder   . Schizophrenia   . Hepatitis C   . 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 Leg surgery     No family history on file. History  Substance Use Topics  . Smoking status: Current Every Day Smoker -- 1.00 packs/day    Types: Cigarettes  . Smokeless tobacco: Never Used  . Alcohol Use: Yes     Comment: 12-18 packs of beer/day    Review of Systems  Constitutional: Negative for fever and chills.  Gastrointestinal: Negative for nausea and vomiting.  Musculoskeletal: Positive for arthralgias.  Skin: Positive for wound.  All other systems reviewed and are negative.    Allergies  Phenytoin sodium extended; Pineapple; and Risperdal  Home Medications   Prior to Admission medications   Medication Sig Start Date  End Date Taking? Authorizing Provider  acetaminophen (TYLENOL) 500 MG tablet Take 2,000 mg by mouth every 8 (eight) hours as needed for moderate pain.    Historical Provider, MD  bacitracin ointment Apply 1 application topically 2 (two) times daily. 08/29/14   Antonietta Breach, PA-C  cloNIDine (CATAPRES) 0.1 MG tablet Take 0.1 mg by mouth daily.    Historical Provider, MD  divalproex (DEPAKOTE) 500 MG DR tablet Take 3 tablets (1,500 mg total) by mouth at bedtime. 01/22/14   Nena Polio, PA-C  FLUoxetine (PROZAC) 20 MG capsule Take 1 capsule (20 mg total) by mouth every morning. 01/22/14   Nena Polio, PA-C  QUEtiapine (SEROQUEL) 100 MG tablet Take 1 tablet (100 mg total) by mouth 2 (two) times daily. 01/22/14   Nena Polio, PA-C  traMADol (ULTRAM) 50 MG tablet Take 1 tablet (50 mg total) by mouth every 6 (six) hours as needed for severe pain. 08/29/14   Antonietta Breach, PA-C  traZODone (DESYREL) 100 MG tablet Take 1 tablet (100 mg total) by mouth at bedtime. 01/22/14   Nena Polio, PA-C   BP 150/104 mmHg  Pulse 81  Temp(Src) 98.3 F (36.8 C) (Oral)  Resp 16  SpO2 98%   Physical Exam  Constitutional: He is oriented to person, place, and time. He appears well-developed and well-nourished. No distress.  HENT:  Head: Normocephalic and  atraumatic.  Eyes: Conjunctivae and EOM are normal.  Neck: Neck supple. No tracheal deviation present.  Cardiovascular: Normal rate and regular rhythm.  Exam reveals no gallop and no friction rub.   No murmur heard. Pulmonary/Chest: Effort normal. No respiratory distress.  Musculoskeletal: Normal range of motion. He exhibits edema and tenderness.  Right hand: thumb ROM normal, DIP and PIP normal digits 3-5 on left hand, no fusion noted around any joints, not warm to touch , mild diffuse edema throughout dorsal aspect of left hand with redness extending up to wrist joint, index finger mild edema, tenderness to palpation of DIP, 3cm round partial thickness burn in stage of  healing on medial dorsal aspect of index DIP,2cm oval shape superficial burn on 2nd digit between PIP and MCP,  4cm partial thickness burn in stages of healing on dorsal aspect of left hand covering the 4th and 5th MCP.    Neurological: He is alert and oriented to person, place, and time.  Skin: Skin is warm and dry.  Psychiatric: He has a normal mood and affect. His behavior is normal.  Nursing note and vitals reviewed.   ED Course  Procedures (including critical care time) DIAGNOSTIC STUDIES: Oxygen Saturation is 98% on RA, normal by my interpretation.    COORDINATION OF CARE: 10:47 PM-Discussed treatment plan with pt at bedside and pt agreed to plan.     Labs Review Labs Reviewed - No data to display  Imaging Review No results found.   EKG Interpretation None      MDM   Final diagnoses:  None   1. Wound recheck  Will provide Rx for pain control and Keflex. Refer to hand for further re-evaluations, however, he is encouraged to return here anytime.  I personally performed the services described in this documentation, which was scribed in my presence. The recorded information has been reviewed and is accurate.       Dewaine Oats, PA-C 09/25/14 2053  Pamella Pert, MD 09/27/14 [phone]

## 2014-12-12 ENCOUNTER — Emergency Department (HOSPITAL_COMMUNITY): Payer: PRIVATE HEALTH INSURANCE

## 2014-12-12 ENCOUNTER — Encounter (HOSPITAL_COMMUNITY): Payer: Self-pay | Admitting: Emergency Medicine

## 2014-12-12 ENCOUNTER — Emergency Department (HOSPITAL_COMMUNITY)
Admission: EM | Admit: 2014-12-12 | Discharge: 2014-12-12 | Disposition: A | Discharge status: Home / Self Care | Payer: PRIVATE HEALTH INSURANCE | Attending: Emergency Medicine | Admitting: Emergency Medicine

## 2014-12-12 DIAGNOSIS — F209 Schizophrenia, unspecified: Secondary | ICD-10-CM | POA: Insufficient documentation

## 2014-12-12 DIAGNOSIS — S40811A Abrasion of right upper arm, initial encounter: Secondary | ICD-10-CM | POA: Insufficient documentation

## 2014-12-12 DIAGNOSIS — S80212A Abrasion, left knee, initial encounter: Secondary | ICD-10-CM | POA: Insufficient documentation

## 2014-12-12 DIAGNOSIS — Z86018 Personal history of other benign neoplasm: Secondary | ICD-10-CM | POA: Insufficient documentation

## 2014-12-12 DIAGNOSIS — Y9389 Activity, other specified: Secondary | ICD-10-CM | POA: Insufficient documentation

## 2014-12-12 DIAGNOSIS — Y9289 Other specified places as the place of occurrence of the external cause: Secondary | ICD-10-CM | POA: Insufficient documentation

## 2014-12-12 DIAGNOSIS — S80211A Abrasion, right knee, initial encounter: Secondary | ICD-10-CM | POA: Insufficient documentation

## 2014-12-12 DIAGNOSIS — Z79899 Other long term (current) drug therapy: Secondary | ICD-10-CM | POA: Insufficient documentation

## 2014-12-12 DIAGNOSIS — F419 Anxiety disorder, unspecified: Secondary | ICD-10-CM | POA: Insufficient documentation

## 2014-12-12 DIAGNOSIS — F4310 Post-traumatic stress disorder, unspecified: Secondary | ICD-10-CM | POA: Insufficient documentation

## 2014-12-12 DIAGNOSIS — M7918 Myalgia, other site: Secondary | ICD-10-CM

## 2014-12-12 DIAGNOSIS — S60512A Abrasion of left hand, initial encounter: Secondary | ICD-10-CM | POA: Insufficient documentation

## 2014-12-12 DIAGNOSIS — S32612A Displaced avulsion fracture of left ischium, initial encounter for closed fracture: Secondary | ICD-10-CM | POA: Insufficient documentation

## 2014-12-12 DIAGNOSIS — Z8619 Personal history of other infectious and parasitic diseases: Secondary | ICD-10-CM | POA: Insufficient documentation

## 2014-12-12 DIAGNOSIS — T07XXXA Unspecified multiple injuries, initial encounter: Secondary | ICD-10-CM

## 2014-12-12 DIAGNOSIS — Z792 Long term (current) use of antibiotics: Secondary | ICD-10-CM | POA: Insufficient documentation

## 2014-12-12 DIAGNOSIS — R52 Pain, unspecified: Secondary | ICD-10-CM

## 2014-12-12 DIAGNOSIS — F319 Bipolar disorder, unspecified: Secondary | ICD-10-CM | POA: Insufficient documentation

## 2014-12-12 DIAGNOSIS — S32602A Unspecified fracture of left ischium, initial encounter for closed fracture: Secondary | ICD-10-CM

## 2014-12-12 DIAGNOSIS — Y998 Other external cause status: Secondary | ICD-10-CM | POA: Insufficient documentation

## 2014-12-12 DIAGNOSIS — Z720 Tobacco use: Secondary | ICD-10-CM | POA: Insufficient documentation

## 2014-12-12 DIAGNOSIS — S30810A Abrasion of lower back and pelvis, initial encounter: Secondary | ICD-10-CM | POA: Insufficient documentation

## 2014-12-12 DIAGNOSIS — S40812A Abrasion of left upper arm, initial encounter: Secondary | ICD-10-CM | POA: Insufficient documentation

## 2014-12-12 MED ORDER — OXYCODONE-ACETAMINOPHEN 5-325 MG PO TABS: ORAL_TABLET | ORAL | Status: DC

## 2014-12-12 MED ORDER — OXYCODONE-ACETAMINOPHEN 5-325 MG PO TABS
2.0000 | ORAL_TABLET | Freq: Once | ORAL | Status: AC
  Administered 2014-12-12: 2 via ORAL
  Filled 2014-12-12: qty 2

## 2014-12-12 MED ORDER — CEPHALEXIN 500 MG PO CAPS: 500.0000 mg | ORAL_CAPSULE | Freq: Four times a day (QID) | ORAL | Status: DC

## 2014-12-12 MED ORDER — CEPHALEXIN 500 MG PO CAPS
500.0000 mg | ORAL_CAPSULE | Freq: Once | ORAL | Status: AC
  Administered 2014-12-12: 500 mg via ORAL
  Filled 2014-12-12: qty 1

## 2014-12-12 NOTE — Discharge Instructions (Signed)
Wash the affected area with soap and water and apply a thin layer of topical antibiotic ointment. Do this every 12 hours.   Do not use rubbing alcohol or hydrogen peroxide.                        Look for signs of infection: if you see redness, if the area becomes warm, if pain increases sharply, there is discharge (pus), if red streaks appear or you develop fever or vomiting, RETURN immediately to the Emergency Department  for a recheck.   Take percocet for breakthrough pain, do not drink alcohol, drive, care for children or do other critical tasks while taking percocet.  Do not hesitate to return to the emergency room for any new, worsening or concerning symptoms.  Please obtain primary care using resource guide below. But the minute you were seen in the emergency room and that they will need to obtain records for further outpatient management.     Emergency Department Resource Guide 1) Find a Doctor and Pay Out of Pocket Although you won't have to find out who is covered by your insurance plan, it is a good idea to ask around and get recommendations. You will then need to call the office and see if the doctor you have chosen will accept you as a new patient and what types of options they offer for patients who are self-pay. Some doctors offer discounts or will set up payment plans for their patients who do not have insurance, but you will need to ask so you aren't surprised when you get to your appointment.  2) Contact Your Local Health Department Not all health departments have doctors that can see patients for sick visits, but many do, so it is worth a call to see if yours does. If you don't know where your local health department is, you can check in your phone book. The CDC also has a tool to help you locate your state's health department, and many state websites also have listings of all of their local health departments.  3) Find a [facility] If your illness is not likely to be  very severe or complicated, you may want to try a walk in clinic. These are popping up all over the country in pharmacies, drugstores, and shopping centers. They're usually staffed by nurse practitioners or physician assistants that have been trained to treat common illnesses and complaints. They're usually fairly quick and inexpensive. However, if you have serious medical issues or chronic medical problems, these are probably not your best option.  No Primary Care Doctor: - Call Health Connect at  [phone] - they can help you locate a primary care doctor that  accepts your insurance, provides certain services, etc. - Physician Referral Service- [phone]  Chronic Pain Problems: Organization         Address  Phone   Notes  [facility]  [phone] Patients need to be referred by their primary care doctor.   Medication Assistance: Organization         Address  Phone   Notes  [facility] Medication [facility] [address] [place], [place] [address] [phone] --Must be a resident of [facility] -- Must have NO insurance coverage whatsoever (no Medicaid/ Medicare, etc.) -- The pt. MUST have a primary care doctor that directs their care regularly and follows them in the community   MedAssist  [phone]   [place]  [address]  [phone]    Agencies that provide inexpensive medical care: Organization         Address  Phone   Notes  [place]  [phone]   [facility]    [phone]   [facility] [place], [place] [address] [phone]   [place] [address], [place] [phone]   Planned Parenthood    [phone]   [facility]    [phone]   [place] and [address], [place] Phone:  [phone], Fax:  [phone] Hours of Operation:  9 am - 6 pm, M-F.  Also  accepts Medicaid/Medicare and self-pay.  [facility] for [place] [address], [address], [place] Phone: [phone], Fax: [phone]. Hours of Operation:  8:30 am - 5:30 pm, M-F.  Also accepts Medicaid and self-pay.  [facility] [place] [address] [place] Phone: [phone]   [place], [place], [place] [phone], Ext. [phone] Mondays & Thursdays: 7-9 AM.  First 15 patients are seen on a first come, first serve basis.    [place] Providers:  Organization         Address  Phone   Notes  [facility] [address],  [phone] Also accepts self-pay patients.  [facility] [address], [place]  [phone]   [place], [address], [place] [phone]   [facility] [address] [place] [phone]   Lucianne Lei [address], [place]   [phone] Only accepts [place] Access [place] patients after they have their name applied to their card.   Self-Pay (no insurance) in [facility]:  Organization         Address  Phone   Notes  Sickle Cell Patients, [facility] [place] [phone]   [facility] Urgent Care [place] [phone]   [facility] [place]  [place], [place], [place] [phone]   [facility]/Dr. Osei-Bonsu  [address], [place] or [address], [place] [phone] Phone number for both [place] and [place] locations is the same.  Urgent Medical and [facility] [address] [place] [phone]   [facility] [address] [place] or [address] [phone]   [facility] [address]  [place] [phone], phone; [phone], fax Sees patients 1st and 3rd Saturday of every month.  Must not qualify for public or private insurance (i.e. Medicaid, Medicare, [place] Health Choice, Veterans' Benefits)  Household income should be no more than 200% of the poverty level The clinic cannot treat you if you are pregnant or think you are pregnant  Sexually transmitted diseases are not treated at the clinic.    Dental Care: Organization         Address  Phone  Notes  [facility] Department of [facility] [place] [phone] Accepts children up to age 35 who are enrolled in [place] or [place]; pregnant women with a Medicaid card; and  children who have applied for Medicaid or [place] Health Choice, but were declined, whose parents can pay a reduced fee at time of service.  [facility] Department of [name]  [address], [place] [phone] Accepts children up to age 69 who are enrolled in [place] or [place]; pregnant women with a Medicaid card; and children who have applied for Medicaid or [place] Health Choice, but were declined, whose parents can pay a reduced fee at time of service.  [facility]  [place] [phone] Patients are seen by appointment only. Walk-ins are not accepted. [place] will see patients 48 years of age and older. Monday - Tuesday (8am-5pm) Most Wednesdays (8:30-5pm) $30 per visit, cash only  [facility] Adult [facility] Access PROGRAM  [address], [facility] [place] [phone] Patients are seen by appointment only. Walk-ins are not accepted. [place] will see patients 6 years of age and older. One Wednesday Evening (Monthly: Volunteer Based).  $30 per visit, cash only  [place]  [phone] for adults; Children under age 63, call Graduate Pediatric Dentistry at [phone].  Children aged 35-14, please call [phone] to request a pediatric application.  Dental services are provided in all areas of dental care including fillings, crowns and bridges, complete and partial dentures, implants, gum treatment, root canals, and extractions. Preventive care is also provided. Treatment is provided to both adults and children. Patients are selected via a lottery and there is often a waiting list.   [facility] [address], [place]  [phone] [email or web address]   [facility] [address] [place], [place] [phone], Ext. [phone] Second and Fourth Thursday of each month, opens at 6:30 AM; Clinic ends at 9 AM.  Patients are seen on a first-come first-served basis, and a limited number are seen during each clinic.   [facility]  [address] Hillard Danker [place], [place] [phone]   Eligibility Requirements You must have lived in [place], [place], or [place] counties for at least the last three months.   You cannot be eligible for state or federal sponsored [employer], including [employer], [place], or [employer].   You generally cannot be eligible for healthcare insurance through your employer.    How to apply: Eligibility screenings are held every Tuesday and Wednesday afternoon from 1:00 pm until 4:00 pm. You do not need an appointment for the interview!  [facility] [address] [place], [place]   [place]  [place] Department  [place]  [phone]    Behavioral Health Resources in the Community: Intensive Outpatient Programs Organization         Address  Phone  Notes  [place] [place], [place], [place] [phone]   [facility] [address] [place], [place]   ADS: Alcohol & Drug Svcs [address], [place], [place]   [place] [address]  [place], [place] or [phone]   Substance Abuse Resources Organization         Address  Phone  Notes  Alcohol and Drug Services  [phone]   [facility]  [phone]   [facility] [place]   [facility]  [phone]   Residential &  Outpatient Substance Abuse Program  [phone]   Psychological Services Organization         Address  Phone  Notes  [facility] Behavioral Health  Agenda  [phone]   [place] [address] [place] or [phone]    Mobile Crisis Teams Organization         Address  Phone  Notes  [facility], Mobile Crisis Care Unit  [phone]   [facility] Psychotherapeutic Services  [address] [place], [place]   [place] [address] [place] [phone]    Self-Help/Support Groups Organization         Address  Phone             Notes  [place]. of [place] - variety of support groups  Cataract Call for more information  Narcotics Anonymous (NA), [facility] [address], [employer] [place]  2 meetings at this location   Special educational needs teacher         Address  Phone  Notes  [facility] Residential Treatment [place],    [place]  [phone]   [facility]  [address], [place] [phone], [place], [place]   [place], [place] [phone] Admissions: 8am-3pm M-F  Incentives Substance [place] [address],    [place], [place] [phone]   [facility] [address] [place], [place], [place]   [facility] [address],  [place], [place]   [facility] - Intensive Outpatient [address], [place], [place]     [facility] [address]  [place], [place] [phone] or [phone]   [facility] Treatment Services (RTS) [address] [place], [place] Accepts Medicaid  [facility] [place] [address]  [place] [phone] Substance Abuse/Addiction Treatment   [facility] Organization         Address  Phone  Notes  [facility]  [phone]   Domenic Schwab, PhD [address] Arlis Porta [place], [place]   [phone] or [phone]   [place], [place] [phone]   [facility] [address], [place], [place] [phone] Insurance/Medicaid/sponsorship through [facility] and Families [address] [place]                                    [place], [place] [phone] [place] [address], [place] [phone]    Dr. Adele Schilder  [phone]   [facility] Dept. 1) [address], [place] 2) [place] 3)  [place] [address], [place] [phone]  [phone]   [place] [phone] or [phone] (After Hours)
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
## 2014-12-18 ENCOUNTER — Emergency Department (HOSPITAL_COMMUNITY)
Admission: EM | Admit: 2014-12-18 | Discharge: 2014-12-18 | Disposition: A | Discharge status: Other Acute Inpatient Hospital | Payer: PRIVATE HEALTH INSURANCE | Attending: Emergency Medicine | Admitting: Emergency Medicine

## 2014-12-18 ENCOUNTER — Encounter (HOSPITAL_COMMUNITY): Payer: Self-pay | Admitting: Emergency Medicine

## 2014-12-18 DIAGNOSIS — R4589 Other symptoms and signs involving emotional state: Secondary | ICD-10-CM

## 2014-12-18 DIAGNOSIS — Z79899 Other long term (current) drug therapy: Secondary | ICD-10-CM | POA: Insufficient documentation

## 2014-12-18 DIAGNOSIS — R4689 Other symptoms and signs involving appearance and behavior: Secondary | ICD-10-CM

## 2014-12-18 DIAGNOSIS — F1210 Cannabis abuse, uncomplicated: Secondary | ICD-10-CM | POA: Insufficient documentation

## 2014-12-18 DIAGNOSIS — F250 Schizoaffective disorder, bipolar type: Secondary | ICD-10-CM | POA: Insufficient documentation

## 2014-12-18 DIAGNOSIS — F419 Anxiety disorder, unspecified: Secondary | ICD-10-CM | POA: Insufficient documentation

## 2014-12-18 DIAGNOSIS — Z859 Personal history of malignant neoplasm, unspecified: Secondary | ICD-10-CM | POA: Insufficient documentation

## 2014-12-18 DIAGNOSIS — Z720 Tobacco use: Secondary | ICD-10-CM | POA: Insufficient documentation

## 2014-12-18 DIAGNOSIS — Z8619 Personal history of other infectious and parasitic diseases: Secondary | ICD-10-CM | POA: Insufficient documentation

## 2014-12-18 DIAGNOSIS — F319 Bipolar disorder, unspecified: Secondary | ICD-10-CM | POA: Insufficient documentation

## 2014-12-18 LAB — COMPREHENSIVE METABOLIC PANEL
ALK PHOS: 67 U/L (ref 39–117)
ALT: 119 U/L — ABNORMAL HIGH (ref 0–53)
AST: 115 U/L — ABNORMAL HIGH (ref 0–37)
Albumin: 4.1 g/dL (ref 3.5–5.2)
Anion gap: 7 (ref 5–15)
BUN: 9 mg/dL (ref 6–23)
CALCIUM: 9.2 mg/dL (ref 8.4–10.5)
CO2: 22 mmol/L (ref 19–32)
Chloride: 103 mmol/L (ref 96–112)
Creatinine, Ser: 0.99 mg/dL (ref 0.50–1.35)
GFR calc Af Amer: >90 mL/min (ref >90)
GFR calc non Af Amer: >90 mL/min (ref >90)
Glucose, Bld: 140 mg/dL — ABNORMAL HIGH (ref 70–99)
POTASSIUM: 3.5 mmol/L (ref 3.5–5.1)
Sodium: 132 mmol/L — ABNORMAL LOW (ref 135–145)
TOTAL PROTEIN: 7.3 g/dL (ref 6.0–8.3)
Total Bilirubin: 1.5 mg/dL — ABNORMAL HIGH (ref 0.3–1.2)

## 2014-12-18 LAB — RAPID URINE DRUG SCREEN, HOSP PERFORMED
Amphetamines: NOT DETECTED
BARBITURATES: NOT DETECTED
Benzodiazepines: NOT DETECTED
Cocaine: NOT DETECTED
Opiates: NOT DETECTED
Tetrahydrocannabinol: POSITIVE — AB

## 2014-12-18 LAB — ETHANOL: Alcohol, Ethyl (B): <5 mg/dL (ref 0–9)

## 2014-12-18 LAB — CBC
HCT: 43.6 % (ref 39.0–52.0)
Hemoglobin: 15.6 g/dL (ref 13.0–17.0)
MCH: 34.1 pg — ABNORMAL HIGH (ref 26.0–34.0)
MCHC: 35.8 g/dL (ref 30.0–36.0)
MCV: 95.2 fL (ref 78.0–100.0)
Platelets: 261 10*3/uL (ref 150–400)
RBC: 4.58 MIL/uL (ref 4.22–5.81)
RDW: 12.2 % (ref 11.5–15.5)
WBC: 5.4 10*3/uL (ref 4.0–10.5)

## 2014-12-18 LAB — ACETAMINOPHEN LEVEL

## 2014-12-18 LAB — SALICYLATE LEVEL: Salicylate Lvl: <4 mg/dL (ref 2.8–20.0)

## 2014-12-18 MED ORDER — NICOTINE 21 MG/24HR TD PT24
21.0000 mg | MEDICATED_PATCH | Freq: Every day | TRANSDERMAL | Status: DC
Start: 2014-12-18 — End: 2014-12-19

## 2014-12-18 MED ORDER — QUETIAPINE FUMARATE 100 MG PO TABS
100.0000 mg | ORAL_TABLET | Freq: Every day | ORAL | Status: DC
  Administered 2014-12-18: 100 mg via ORAL
  Filled 2014-12-18: qty 1

## 2014-12-18 MED ORDER — ACETAMINOPHEN 325 MG PO TABS: 650.0000 mg | ORAL_TABLET | ORAL | Status: DC | PRN

## 2014-12-18 MED ORDER — LORAZEPAM 1 MG PO TABS
1.0000 mg | ORAL_TABLET | Freq: Three times a day (TID) | ORAL | Status: DC | PRN
  Administered 2014-12-18: 1 mg via ORAL
  Filled 2014-12-18: qty 1

## 2014-12-18 MED ORDER — ONDANSETRON HCL 4 MG PO TABS: 4.0000 mg | ORAL_TABLET | Freq: Three times a day (TID) | ORAL | Status: DC | PRN

## 2014-12-18 MED ORDER — QUETIAPINE FUMARATE 50 MG PO TABS
50.0000 mg | ORAL_TABLET | Freq: Three times a day (TID) | ORAL | Status: DC
  Administered 2014-12-18: 50 mg via ORAL
  Filled 2014-12-18: qty 1

## 2014-12-18 MED ORDER — ALUM & MAG HYDROXIDE-SIMETH 200-200-20 MG/5ML PO SUSP: 30.0000 mL | ORAL | Status: DC | PRN

## 2014-12-18 MED ORDER — TRAZODONE HCL 100 MG PO TABS
100.0000 mg | ORAL_TABLET | Freq: Every day | ORAL | Status: DC
  Administered 2014-12-18: 100 mg via ORAL
  Filled 2014-12-18: qty 1

## 2014-12-18 MED ORDER — FLUOXETINE HCL 20 MG PO CAPS: 20.0000 mg | ORAL_CAPSULE | Freq: Every morning | ORAL | Status: DC

## 2014-12-18 MED ORDER — ZOLPIDEM TARTRATE 5 MG PO TABS: 5.0000 mg | ORAL_TABLET | Freq: Every evening | ORAL | Status: DC | PRN

## 2014-12-18 MED ORDER — DIVALPROEX SODIUM 500 MG PO DR TAB
1000.0000 mg | DELAYED_RELEASE_TABLET | Freq: Every day | ORAL | Status: DC
  Administered 2014-12-18: 1000 mg via ORAL
  Filled 2014-12-18: qty 2

## 2014-12-18 NOTE — ED Notes (Signed)
Pt reports suicidal thoughts with plan to hang self. Has been on medication for bipolar/schizophrenia for the past year and a half, but not working as well anymore. Having visual and auditory hallucinations telling him to kill himself and hurt others. No intention to hurt others. Has also started drinking etoh again, last drink last night.
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
## 2014-12-18 NOTE — BHH Counselor (Signed)
Per Patriciaann Clan, PA, pt meets inpt criteria. Per Luretha Murphy, pt accepted to [facility] bed 502-2 by Dr Shea Evans.  Waiting for updated vitals before transfer to [facility]. Attending RN made aware and EDP (Dr Audie Pinto) made aware of disposition and need for transfer.   Ramond Dial, [facility] Triage Specialist

## 2014-12-19 ENCOUNTER — Encounter (HOSPITAL_COMMUNITY): Payer: Self-pay | Admitting: *Deleted

## 2014-12-19 ENCOUNTER — Inpatient Hospital Stay (HOSPITAL_COMMUNITY)
Admission: EM | Admit: 2014-12-19 | Discharge: 2014-12-24 | DRG: 885 | Disposition: A | Discharge status: Home / Self Care | Payer: Federal, State, Local not specified - Other | Source: Intra-hospital | Attending: Psychiatry | Admitting: Psychiatry

## 2014-12-19 DIAGNOSIS — F4310 Post-traumatic stress disorder, unspecified: Secondary | ICD-10-CM | POA: Diagnosis present

## 2014-12-19 DIAGNOSIS — F319 Bipolar disorder, unspecified: Secondary | ICD-10-CM | POA: Diagnosis present

## 2014-12-19 DIAGNOSIS — F419 Anxiety disorder, unspecified: Secondary | ICD-10-CM | POA: Diagnosis present

## 2014-12-19 DIAGNOSIS — F17210 Nicotine dependence, cigarettes, uncomplicated: Secondary | ICD-10-CM | POA: Diagnosis present

## 2014-12-19 DIAGNOSIS — G4700 Insomnia, unspecified: Secondary | ICD-10-CM | POA: Diagnosis present

## 2014-12-19 DIAGNOSIS — S32609A Unspecified fracture of unspecified ischium, initial encounter for closed fracture: Secondary | ICD-10-CM

## 2014-12-19 DIAGNOSIS — Z62810 Personal history of physical and sexual abuse in childhood: Secondary | ICD-10-CM | POA: Diagnosis present

## 2014-12-19 DIAGNOSIS — F259 Schizoaffective disorder, unspecified: Principal | ICD-10-CM

## 2014-12-19 DIAGNOSIS — F1020 Alcohol dependence, uncomplicated: Secondary | ICD-10-CM | POA: Diagnosis present

## 2014-12-19 DIAGNOSIS — F250 Schizoaffective disorder, bipolar type: Secondary | ICD-10-CM | POA: Diagnosis present

## 2014-12-19 DIAGNOSIS — F1210 Cannabis abuse, uncomplicated: Secondary | ICD-10-CM | POA: Diagnosis present

## 2014-12-19 DIAGNOSIS — Z62811 Personal history of psychological abuse in childhood: Secondary | ICD-10-CM | POA: Diagnosis present

## 2014-12-19 DIAGNOSIS — F10988 Alcohol use, unspecified with other alcohol-induced disorder: Secondary | ICD-10-CM

## 2014-12-19 DIAGNOSIS — K219 Gastro-esophageal reflux disease without esophagitis: Secondary | ICD-10-CM | POA: Insufficient documentation

## 2014-12-19 DIAGNOSIS — F10188 Alcohol abuse with other alcohol-induced disorder: Secondary | ICD-10-CM | POA: Diagnosis present

## 2014-12-19 MED ORDER — BENZTROPINE MESYLATE 0.5 MG PO TABS
0.5000 mg | ORAL_TABLET | Freq: Two times a day (BID) | ORAL | Status: DC
  Administered 2014-12-19 – 2014-12-23 (×9): 0.5 mg via ORAL
  Filled 2014-12-19 (×8): qty 1
  Filled 2014-12-19: qty 28
  Filled 2014-12-19: qty 1
  Filled 2014-12-19: qty 28
  Filled 2014-12-19 (×4): qty 1

## 2014-12-19 MED ORDER — DIVALPROEX SODIUM 500 MG PO DR TAB
500.0000 mg | DELAYED_RELEASE_TABLET | Freq: Every day | ORAL | Status: DC
  Administered 2014-12-19: 500 mg via ORAL
  Filled 2014-12-19 (×4): qty 1

## 2014-12-19 MED ORDER — LAMOTRIGINE 25 MG PO TABS
25.0000 mg | ORAL_TABLET | Freq: Every day | ORAL | Status: DC
  Administered 2014-12-19 – 2014-12-23 (×5): 25 mg via ORAL
  Filled 2014-12-19 (×7): qty 1
  Filled 2014-12-19: qty 14

## 2014-12-19 MED ORDER — LOPERAMIDE HCL 2 MG PO CAPS: 2.0000 mg | ORAL_CAPSULE | ORAL | Status: AC | PRN

## 2014-12-19 MED ORDER — LOPERAMIDE HCL 2 MG PO CAPS: 2.0000 mg | ORAL_CAPSULE | ORAL | Status: DC | PRN

## 2014-12-19 MED ORDER — DOUBLE ANTIBIOTIC 500-10000 UNIT/GM EX OINT
TOPICAL_OINTMENT | Freq: Three times a day (TID) | CUTANEOUS | Status: DC
  Administered 2014-12-19 – 2014-12-22 (×8): via TOPICAL
  Administered 2014-12-23: 1 via TOPICAL
  Administered 2014-12-23: 12:00:00 via TOPICAL
  Filled 2014-12-19 (×25): qty 1

## 2014-12-19 MED ORDER — CHLORDIAZEPOXIDE HCL 25 MG PO CAPS: 25.0000 mg | ORAL_CAPSULE | Freq: Three times a day (TID) | ORAL | Status: DC

## 2014-12-19 MED ORDER — IBUPROFEN 600 MG PO TABS
600.0000 mg | ORAL_TABLET | Freq: Four times a day (QID) | ORAL | Status: DC | PRN
  Administered 2014-12-20 – 2014-12-23 (×4): 600 mg via ORAL
  Filled 2014-12-19 (×4): qty 1

## 2014-12-19 MED ORDER — TRAZODONE HCL 150 MG PO TABS
150.0000 mg | ORAL_TABLET | Freq: Every day | ORAL | Status: DC
  Administered 2014-12-19 – 2014-12-23 (×5): 150 mg via ORAL
  Filled 2014-12-19: qty 14
  Filled 2014-12-19 (×7): qty 1

## 2014-12-19 MED ORDER — LORAZEPAM 1 MG PO TABS
1.0000 mg | ORAL_TABLET | Freq: Every day | ORAL | Status: AC
  Administered 2014-12-23: 1 mg via ORAL
  Filled 2014-12-19 (×2): qty 1

## 2014-12-19 MED ORDER — ALUM & MAG HYDROXIDE-SIMETH 200-200-20 MG/5ML PO SUSP
30.0000 mL | ORAL | Status: DC | PRN
  Administered 2014-12-23: 30 mL via ORAL
  Filled 2014-12-19: qty 30

## 2014-12-19 MED ORDER — HYDROXYZINE HCL 25 MG PO TABS: 25.0000 mg | ORAL_TABLET | Freq: Four times a day (QID) | ORAL | Status: DC | PRN

## 2014-12-19 MED ORDER — CHLORDIAZEPOXIDE HCL 25 MG PO CAPS: 25.0000 mg | ORAL_CAPSULE | Freq: Every day | ORAL | Status: DC

## 2014-12-19 MED ORDER — MAGNESIUM HYDROXIDE 400 MG/5ML PO SUSP: 30.0000 mL | Freq: Every day | ORAL | Status: DC | PRN

## 2014-12-19 MED ORDER — CLONIDINE HCL 0.1 MG PO TABS
0.1000 mg | ORAL_TABLET | Freq: Two times a day (BID) | ORAL | Status: DC
  Administered 2014-12-19 – 2014-12-23 (×11): 0.1 mg via ORAL
  Filled 2014-12-19: qty 1
  Filled 2014-12-19: qty 28
  Filled 2014-12-19 (×2): qty 1
  Filled 2014-12-19: qty 28
  Filled 2014-12-19 (×13): qty 1

## 2014-12-19 MED ORDER — CHLORDIAZEPOXIDE HCL 25 MG PO CAPS: 25.0000 mg | ORAL_CAPSULE | ORAL | Status: DC

## 2014-12-19 MED ORDER — ACETAMINOPHEN 325 MG PO TABS
650.0000 mg | ORAL_TABLET | Freq: Four times a day (QID) | ORAL | Status: DC | PRN
  Administered 2014-12-19 – 2014-12-24 (×3): 650 mg via ORAL
  Filled 2014-12-19 (×3): qty 2

## 2014-12-19 MED ORDER — THIAMINE HCL 100 MG/ML IJ SOLN: 100.0000 mg | Freq: Once | INTRAMUSCULAR | Status: DC

## 2014-12-19 MED ORDER — ADULT MULTIVITAMIN W/MINERALS CH
1.0000 | ORAL_TABLET | Freq: Every day | ORAL | Status: DC
  Filled 2014-12-19: qty 1

## 2014-12-19 MED ORDER — CHLORDIAZEPOXIDE HCL 25 MG PO CAPS: 25.0000 mg | ORAL_CAPSULE | Freq: Four times a day (QID) | ORAL | Status: DC

## 2014-12-19 MED ORDER — VITAMIN B-1 100 MG PO TABS: 100.0000 mg | ORAL_TABLET | Freq: Every day | ORAL | Status: DC

## 2014-12-19 MED ORDER — ONDANSETRON 4 MG PO TBDP
4.0000 mg | ORAL_TABLET | Freq: Four times a day (QID) | ORAL | Status: AC | PRN
  Administered 2014-12-19 – 2014-12-21 (×2): 4 mg via ORAL
  Filled 2014-12-19 (×2): qty 1

## 2014-12-19 MED ORDER — FLUOXETINE HCL 10 MG PO CAPS
10.0000 mg | ORAL_CAPSULE | Freq: Every morning | ORAL | Status: DC
  Administered 2014-12-19: 10 mg via ORAL
  Filled 2014-12-19 (×3): qty 1

## 2014-12-19 MED ORDER — ADULT MULTIVITAMIN W/MINERALS CH
1.0000 | ORAL_TABLET | Freq: Every day | ORAL | Status: DC
  Administered 2014-12-19 – 2014-12-23 (×5): 1 via ORAL
  Filled 2014-12-19 (×5): qty 1
  Filled 2014-12-19: qty 14
  Filled 2014-12-19 (×2): qty 1

## 2014-12-19 MED ORDER — LORAZEPAM 1 MG PO TABS
1.0000 mg | ORAL_TABLET | Freq: Four times a day (QID) | ORAL | Status: AC | PRN
  Administered 2014-12-19 – 2014-12-21 (×4): 1 mg via ORAL
  Filled 2014-12-19 (×5): qty 1

## 2014-12-19 MED ORDER — ONDANSETRON 4 MG PO TBDP: 4.0000 mg | ORAL_TABLET | Freq: Four times a day (QID) | ORAL | Status: DC | PRN

## 2014-12-19 MED ORDER — LORAZEPAM 1 MG PO TABS
1.0000 mg | ORAL_TABLET | Freq: Four times a day (QID) | ORAL | Status: AC
  Administered 2014-12-19 – 2014-12-20 (×5): 1 mg via ORAL
  Filled 2014-12-19 (×5): qty 1

## 2014-12-19 MED ORDER — CHLORDIAZEPOXIDE HCL 25 MG PO CAPS: 25.0000 mg | ORAL_CAPSULE | Freq: Four times a day (QID) | ORAL | Status: DC | PRN

## 2014-12-19 MED ORDER — HALOPERIDOL 5 MG PO TABS
5.0000 mg | ORAL_TABLET | Freq: Two times a day (BID) | ORAL | Status: DC
  Administered 2014-12-19 – 2014-12-23 (×9): 5 mg via ORAL
  Filled 2014-12-19: qty 28
  Filled 2014-12-19: qty 1
  Filled 2014-12-19: qty 28
  Filled 2014-12-19 (×11): qty 1

## 2014-12-19 MED ORDER — LORAZEPAM 1 MG PO TABS
1.0000 mg | ORAL_TABLET | Freq: Three times a day (TID) | ORAL | Status: AC
  Administered 2014-12-20 – 2014-12-21 (×4): 1 mg via ORAL
  Filled 2014-12-19 (×2): qty 1

## 2014-12-19 MED ORDER — TRAZODONE HCL 50 MG PO TABS
50.0000 mg | ORAL_TABLET | Freq: Every evening | ORAL | Status: DC | PRN
  Administered 2014-12-19: 50 mg via ORAL
  Filled 2014-12-19 (×5): qty 1

## 2014-12-19 MED ORDER — NICOTINE 21 MG/24HR TD PT24
21.0000 mg | MEDICATED_PATCH | Freq: Every day | TRANSDERMAL | Status: DC
  Administered 2014-12-19 – 2014-12-24 (×6): 21 mg via TRANSDERMAL
  Filled 2014-12-19 (×7): qty 1
  Filled 2014-12-19: qty 14
  Filled 2014-12-19: qty 1

## 2014-12-19 MED ORDER — LORAZEPAM 1 MG PO TABS
1.0000 mg | ORAL_TABLET | Freq: Two times a day (BID) | ORAL | Status: AC
  Administered 2014-12-21 – 2014-12-22 (×2): 1 mg via ORAL
  Filled 2014-12-19 (×2): qty 1

## 2014-12-19 MED ORDER — VITAMIN B-1 100 MG PO TABS
100.0000 mg | ORAL_TABLET | Freq: Every day | ORAL | Status: DC
  Administered 2014-12-20 – 2014-12-23 (×4): 100 mg via ORAL
  Filled 2014-12-19 (×3): qty 1
  Filled 2014-12-19: qty 14
  Filled 2014-12-19 (×3): qty 1
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
## 2014-12-19 NOTE — Progress Notes (Signed)
D:  Pt passive SI-contracts for safety.  Pt denies HI/AVH. Pt is pleasant and cooperative. Pt forwards little.   A: Pt was offered support and encouragement. Pt was given scheduled medications. Pt was encourage to attend groups. Q 15 minute checks were done for safety.   R:Pt attends groups and interacts well with peers and staff. Pt is taking medication. Pt has no complaints at this time .Pt receptive to treatment and safety maintained on unit.
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
## 2014-12-19 NOTE — Tx Team (Signed)
Initial Interdisciplinary Treatment Plan   PATIENT STRESSORS: Financial difficulties Health problems Medication change or noncompliance Substance abuse   PATIENT STRENGTHS: Curator fund of knowledge Supportive family/friends   PROBLEM LIST: Problem List/Patient Goals Date to be addressed Date deferred Reason deferred Estimated date of resolution  "having suicidal, homicidal thoughts" 12-19-14     "homicial towards people that make me angry" 12-19-14     "need medication" 12-19-14     "feel like I can run out of my skin right now" 12-19-14     Substance abuse 12-19-14     "see things" 12-19-14                        DISCHARGE CRITERIA:  Improved stabilization in mood, thinking, and/or behavior Medical problems require only outpatient monitoring Need for constant or close observation no longer present Reduction of life-threatening or endangering symptoms to within safe limits Verbal commitment to aftercare and medication compliance Withdrawal symptoms are absent or subacute and managed without 24-hour nursing intervention  PRELIMINARY DISCHARGE PLAN: Attend 12-step recovery group Participate in family therapy Return to previous living arrangement  PATIENT/FAMIILY INVOLVEMENT: This treatment plan has been presented to and reviewed with the patient, Brian [name], and/or family member.  The patient and family have been given the opportunity to ask questions and make suggestions.  Zoe Lan 12/19/2014, 2:02 AM
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
## 2014-12-19 NOTE — [facility] Group Notes (Signed)
[name] Notes:  (Counselor/Nursing/MHT/Case Management/Adjunct)  12/19/2014 1:15PM  Type of Therapy:  Group Therapy  Participation Level:  Active  Participation Quality:  Appropriate  Affect:  Flat  Cognitive:  Oriented  Insight:  Improving  Engagement in Group:  Limited  Engagement in Therapy:  Limited  Modes of Intervention:  Discussion, Exploration and Socialization  Summary of Progress/Problems: The topic for group was balance in life.  Pt participated in the discussion about when their life was in balance and out of balance and how this feels.  Pt discussed ways to get back in balance and short term goals they can work on to get where they want to be. Declined to attend.  Stated that he felt bad from detox.   Brian [name] 12/19/2014 1:18 PM
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
## 2014-12-20 DIAGNOSIS — S32609A Unspecified fracture of unspecified ischium, initial encounter for closed fracture: Secondary | ICD-10-CM

## 2014-12-20 LAB — COMPREHENSIVE METABOLIC PANEL
ALT: 85 U/L — ABNORMAL HIGH (ref 0–53)
AST: 68 U/L — AB (ref 0–37)
Albumin: 4.3 g/dL (ref 3.5–5.2)
Alkaline Phosphatase: 59 U/L (ref 39–117)
Anion gap: 7 (ref 5–15)
BUN: 8 mg/dL (ref 6–23)
CALCIUM: 9.3 mg/dL (ref 8.4–10.5)
CHLORIDE: 104 mmol/L (ref 96–112)
CO2: 27 mmol/L (ref 19–32)
CREATININE: 0.95 mg/dL (ref 0.50–1.35)
GFR calc Af Amer: >90 mL/min (ref >90)
GLUCOSE: 110 mg/dL — AB (ref 70–99)
POTASSIUM: 4.1 mmol/L (ref 3.5–5.1)
SODIUM: 138 mmol/L (ref 135–145)
Total Bilirubin: 1.2 mg/dL (ref 0.3–1.2)
Total Protein: 7.2 g/dL (ref 6.0–8.3)

## 2014-12-20 LAB — LIPID PANEL
CHOLESTEROL: 211 mg/dL — AB (ref 0–200)
HDL: 31 mg/dL — ABNORMAL LOW (ref >39)
LDL CALC: 151 mg/dL — AB (ref 0–99)
Total CHOL/HDL Ratio: 6.8 RATIO
Triglycerides: 146 mg/dL (ref <150)
VLDL: 29 mg/dL (ref 0–40)

## 2014-12-20 LAB — TSH: TSH: 2.942 u[IU]/mL (ref 0.350–4.500)

## 2014-12-20 NOTE — [facility] Group Notes (Signed)
[facility] LCSW Aftercare Discharge Planning Group Note   12/20/2014 10:41 AM  Participation Quality:  In bed asleep.  Did not respond to my invitation to join [place]    [place], Barbaraann Rondo B

## 2014-12-20 NOTE — Progress Notes (Signed)
D: Pt denies SI/HI/AVh. Pt is pleasant and cooperative. Pt not as delayed with responses as yesterday, pt more vocal when talking to Probation officer. Pt showed little thought blocking, but pt is appropriate on unit.   A: Pt was offered support and encouragement. Pt was given scheduled medications. Pt was encourage to attend groups. Q 15 minute checks were done for safety.   R:Pt attends groups and interacts well with peers and staff. Pt is taking medication.Pt receptive to treatment and safety maintained on unit.

## 2014-12-20 NOTE — Plan of Care (Signed)
Problem: Diagnosis: Increased Risk For Suicide Attempt Goal: LTG-Patient Will Show Positive Response to Medication LTG (by discharge) : Patient will show positive response to medication and will participate in the development of the discharge plan.  Outcome: Progressing Pt denies SI at this time   Problem: Alteration in thought process Goal: LTG-Patient has not harmed self or others in at least 2 days Outcome: Progressing Pt safe on the unit at this time   Problem: [facility] Concurrent Medical Problem Goal: STG-Compliance with medication and/or treatment as ordered (STG-Compliance with medication and/or treatment as ordered by MD)  Outcome: Progressing Pt taking medications as prescribed.

## 2014-12-20 NOTE — Progress Notes (Signed)
Did not attend group 
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
## 2014-12-20 NOTE — [facility] Suicide Risk Assessment (Signed)
[facility] INPATIENT:  Family/Significant Other Suicide Prevention Education  Suicide Prevention Education:  Education Completed; Brian [name], mother, [phone] has been identified by the patient as the family member/significant other with whom the patient will be residing, and identified as the person(s) who will aid the patient in the event of a mental health crisis (suicidal ideations/suicide attempt).  With written consent from the patient, the family member/significant other has been provided the following suicide prevention education, prior to the and/or following the discharge of the patient.  The suicide prevention education provided includes the following:  Suicide risk factors  Suicide prevention and interventions  National Suicide Hotline telephone number  [facility] assessment telephone number  [facility] Emergency Assistance [place] and/or Residential Mobile Crisis Unit telephone number  Request made of family/significant other to:  Remove weapons (e.g., guns, rifles, knives), all items previously/currently identified as safety concern.    Remove drugs/medications (over-the-counter, prescriptions, illicit drugs), all items previously/currently identified as a safety concern.  The family member/significant other verbalizes understanding of the suicide prevention education information provided.  The family member/significant other agrees to remove the items of safety concern listed above.  Trish Mage 12/20/2014, 3:49 PM

## 2014-12-20 NOTE — Tx Team (Signed)
  Interdisciplinary Treatment Plan Update   Date Reviewed:  12/20/2014  Time Reviewed:  8:16 AM  Progress in Treatment:   Attending groups: No Participating in groups: No Taking medication as prescribed: Yes  Tolerating medication: Yes Family/Significant other contact made: No  Patient understands diagnosis: Yes AEB asking for help with SI, HI and psychosis Discussing patient identified problems/goals with staff: Yes  See initial care plan Medical problems stabilized or resolved: Yes Denies suicidal/homicidal ideation: Yes  In tx team Patient has not harmed self or others: Yes  For review of initial/current patient goals, please see plan of care.  Estimated Length of Stay:  4-5 days  Reason for Continuation of Hospitalization: Depression Hallucinations Medication stabilization Withdrawal symptoms  New Problems/Goals identified:  N/A  Discharge Plan or Barriers:   return home, follow up outpt  Additional Comments:  Brian [name] is a single, 29 y.o. male presenting to [facility] with SI/HI and VH. He reports that he receives med management from [place] and that he does not feel like his medications are working. He reports having a visual hallucination this evening in graphic detail of how he should hang himself with a rope. Pt reports a long mental health hx with repeated hospitalizations at [facility] and [place] in the past. He has a hx of 2 prior suicide attempts. Pt reports increased depression and mania, stating that he has not slept for 2 days, is more irritable, feels hopeless and worthless, decreased appetite, and has been socially isolating. Pt is having HI "towards someone who I have no access to because they live far away". Pt reports that he has never acted on his HI before. Pt endorses nightly etoh use, usually in the amount of 4-5 40 oz beers. Pt reports experiencing withdrawal sx when he does not drink, such as sweats and chills, but no hx of DTs. Pt says he thinks he may have had a  seizure from withdrawal once but he is unsure. Pt also endorses occasional use of THC.  Haldol, Lamictal, Trazodone trial along with Ativan taper  Attendees:  Signature: Steva Colder, MD 12/20/2014 8:16 AM   Signature: Ripley Fraise, LCSW 12/20/2014 8:16 AM  Signature: Elmarie Shiley, NP 12/20/2014 8:16 AM  Signature: Mayra Neer, RN 12/20/2014 8:16 AM  Signature: Darrol Angel, RN 12/20/2014 8:16 AM  Signature:  12/20/2014 8:16 AM  Signature:   12/20/2014 8:16 AM  Signature:    Signature:    Signature:    Signature:    Signature:    Signature:      Scribe for Treatment Team:   [employer], LCSW  12/20/2014 8:16 AM

## 2014-12-20 NOTE — Plan of Care (Signed)
Problem: Food- and Nutrition-Related Knowledge Deficit (NB-1.1) Goal: Nutrition education Formal process to instruct or train a patient/client in a skill or to impart knowledge to help patients/clients voluntarily manage or modify food choices and eating behavior to maintain or improve health. Outcome: Completed/Met Date Met:  12/20/14 Nutrition Education Note  RD consulted for nutrition education regarding hyperlipidemia.   Lipid Panel     Component Value Date/Time    CHOL 211* 12/20/2014 0656    TRIG 146 12/20/2014 0656    HDL 31* 12/20/2014 0656    CHOLHDL 6.8 12/20/2014 0656    VLDL 29 12/20/2014 0656    LDLCALC 151* 12/20/2014 0656    RD provided "Hight Cholesterol" handout from the Academy of Nutrition and Dietetics. Reviewed patient's dietary recall. Provided examples on ways to decrease cholesterol and fat intake in diet. Discouraged intake of high saturated-fat containing food. Encouraged fresh fruits and vegetables as well as whole grain sources of carbohydrates to maximize fiber intake. Teach back method used.  Expect good compliance.  Body mass index is 36.56 kg/(m^2). Pt meets criteria for obesity based on current BMI.  Current diet order is regular, patient is consuming approximately 100% of meals at this time. Labs and medications reviewed. No further nutrition interventions warranted at this time. RD contact information provided. If additional nutrition issues arise, please re-consult RD.  Laurette Schimke [name], [place], [place]

## 2014-12-20 NOTE — Progress Notes (Signed)
Pt attended karaoke night. Brian [name] 12/19/2014

## 2014-12-21 ENCOUNTER — Encounter (HOSPITAL_COMMUNITY): Payer: Self-pay | Admitting: Registered Nurse

## 2014-12-21 LAB — HEMOGLOBIN A1C
Hgb A1c MFr Bld: 5.2 % (ref 4.8–5.6)
Mean Plasma Glucose: 103 mg/dL

## 2014-12-21 NOTE — [facility] Group Notes (Addendum)
[place] Group Notes: (Clinical Social Work)   12/21/2014      Type of Therapy:  Group Therapy   Participation Level:  Did Not Attend despite MHT prompting   Selmer Dominion, LCSW 12/21/2014, 12:35 PM

## 2014-12-21 NOTE — Progress Notes (Signed)
Adult Psychoeducational Group Note  Date:  12/21/2014 Time:  8:45 PM  Group Topic/Focus:  Wrap-Up Group:   The focus of this group is to help patients review their daily goal of treatment and discuss progress on daily workbooks.  Participation Level:  Minimal  Participation Quality:  Appropriate  Affect:  Appropriate  Cognitive:  Alert  Insight: Good  Engagement in Group:  Engaged  Modes of Intervention:  Discussion  Additional Comments:  Pt stated that he was been feeling confused and scared about what the future holds. He feels that the med adjustments are working well for him.   Wynelle Fanny R 12/21/2014, 8:45 PM

## 2014-12-21 NOTE — Progress Notes (Signed)
D: Pt denies SI/HI/AVh. Pt is pleasant and cooperative. Pt focused on sleeping tonight. Pt observed on milieu , but pt stated he was tired and wanted to go to sleep tonight. Pt forwarded little information to Probation officer.   A: Pt was offered support and encouragement. Pt was given scheduled medications. Pt was encourage to attend groups. Q 15 minute checks were done for safety.   R:Pt attends groups and interacts well with peers and staff. Pt is taking medication. Pt has no complaints at this time .Pt receptive to treatment and safety maintained on unit.

## 2014-12-21 NOTE — [facility] Group Notes (Signed)
[place] Group Notes:  (Nursing/MHT/Case Management/Adjunct)  Date:  12/21/2014  Time:  11:45 AM  Type of Therapy:  Psychoeducational Skills  Participation Level:  Did Not Attend  Participation Quality:  Did Not Attend  Affect:  Did Not Attend  Cognitive:  Did Not Attend  Insight:  None  Engagement in Group:  Did Not Attend  Modes of Intervention:  Did Not Attend  Summary of Progress/Problems: Pt did not attend patient self inventory group.   Benancio Deeds Shanta 12/21/2014, 11:45 AM
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
## 2014-12-22 MED ORDER — HYDROXYZINE HCL 50 MG PO TABS
50.0000 mg | ORAL_TABLET | Freq: Three times a day (TID) | ORAL | Status: DC | PRN
  Administered 2014-12-23 – 2014-12-24 (×3): 50 mg via ORAL
  Filled 2014-12-22 (×3): qty 1
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
## 2014-12-22 NOTE — Progress Notes (Signed)
Pt agitated with medications. Pt stated he is supposed to be on Seroquel, but pt does not have it on his home medication list. Pt is focused on medications. Pt was informed he was at the end of his protocol, and he did not have anymore Ativan PRN.

## 2014-12-22 NOTE — [facility] Group Notes (Signed)
[facility] Group Notes:  (Nursing/MHT/Case Management/Adjunct)  Date:  12/22/2014  Time:  2:57 PM  Type of Therapy:  Psychoeducational Skills  Participation Level:  Did Not Attend  Participation Quality:  Did Not Attend  Affect:  Did Not Attend  Cognitive:  Did Not Attend  Insight:  None  Engagement in Group:  Did Not Attend  Modes of Intervention:  Did Not Attend  Summary of Progress/Problems: Pt did not attend patient self inventory group.  Benancio Deeds Shanta 12/22/2014, 2:57 PM

## 2014-12-22 NOTE — [facility] Group Notes (Signed)
[facility] Group Notes:  (Clinical Social Work)  12/22/2014  [facility] Group Notes:  (Clinical Social Work)  12/22/2014  11:00AM-12:00PM  Summary of Progress/Problems:  The main focus of today's process group was to listen to a variety of genres of music and to identify that different types of music provoke different responses.  The patient then was able to identify personally what was soothing for them, as well as energizing.  Handouts were used to record feelings evoked, as well as how patient can personally use this knowledge in sleep habits, with depression, and with other symptoms.  The patient expressed understanding of concepts, as well as knowledge of how each type of music affected him/her and how this can be used at home as a wellness/recovery tool.  His affect was not congruent in much of group, where he would smile while talking about how he did not like a song or it made him feel sad.  Type of Therapy:  Music Therapy   Participation Level:  Active  Participation Quality:  Attentive and Sharing  Affect:  Blunted, Not Congruent  Cognitive:  Oriented  Insight:  Engaged  Engagement in Therapy:  Engaged  Modes of Intervention:   Activity, Exploration  Selmer Dominion, LCSW 12/22/2014

## 2014-12-22 NOTE — Progress Notes (Signed)
Pt presents with depressed mood, affect blunted. Brian [name] had episode of vomiting this am - stating ''ate too much at breakfast. '' he denied any ginger ale when offered. He has been interactive on the unit, denies any depression or any SI/HI/A/V Hallucinations. No acute concerns voiced this shift. Pt is safe. Will continue to monitor q 15 minutes for safety.

## 2014-12-22 NOTE — Progress Notes (Signed)
Pt apologized to Probation officer for his outburst at the medication window. Writer explained to pt if he could get verification that he is on the medication the Dr. Would have to at least consider giving it to you as long as there was no contradictions.

## 2014-12-22 NOTE — Progress Notes (Signed)
D: Pt denies SI/HI/AVH. Pt is pleasant and cooperative. Pt focused on trying to get his medications early. Pt did get up and was seen on the milieu.   A: Pt was offered support and encouragement. Pt was given scheduled medications. Pt was encourage to attend groups. Q 15 minute checks were done for safety.   R:Pt attends groups and interacts well with peers and staff. Pt is taking medication. Pt has no complaints at this time .Pt receptive to treatment and safety maintained on unit.

## 2014-12-22 NOTE — Progress Notes (Signed)
Psychoeducational Group Note  Date:  12/22/2014 Time:  2041  Group Topic/Focus:  Wrap-Up Group:   The focus of this group is to help patients review their daily goal of treatment and discuss progress on daily workbooks.  Participation Level: Did Not Attend  Participation Quality:  Not Applicable  Affect:  Not Applicable  Cognitive:  Not Applicable  Insight:  Not Applicable  Engagement in Group: Not Applicable  Additional Comments:  The patient did not attend group and remained asleep in his bed.   Mareena Cavan S 12/22/2014, 8:40 PM

## 2014-12-23 DIAGNOSIS — K219 Gastro-esophageal reflux disease without esophagitis: Secondary | ICD-10-CM | POA: Diagnosis not present

## 2014-12-23 MED ORDER — PANTOPRAZOLE SODIUM 20 MG PO TBEC
20.0000 mg | DELAYED_RELEASE_TABLET | Freq: Two times a day (BID) | ORAL | Status: DC
  Administered 2014-12-23 – 2014-12-24 (×2): 20 mg via ORAL
  Filled 2014-12-23 (×2): qty 1
  Filled 2014-12-23 (×2): qty 28
  Filled 2014-12-23 (×2): qty 1

## 2014-12-23 NOTE — Plan of Care (Signed)
Problem: Alteration in thought process Goal: LTG-Patient verbalizes understanding importance med regimen (Patient verbalizes understanding of importance of medication regimen and need to continue outpatient care.)  Outcome: Progressing Pt understands taking his medications daily by his own admission
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
## 2014-12-23 NOTE — Plan of Care (Signed)
Problem: Alteration in thought process Goal: STG-Patient is able to discuss thoughts with staff Outcome: Progressing Patient is minimal but when asked open ended questions patient is able to answer.

## 2014-12-23 NOTE — Progress Notes (Signed)
The focus of this group is to help patients review their daily goal of treatment and discuss progress on daily workbooks. Pt attended the evening group session and responded to all discussion prompts from the [place]. Pt shared that today was a good day, the highlight of which was the possibility of going home tomorrow. Pt shared with the group that he planned to attend rehab upon leaving, which he was admittedly nervous about. The Writer provided encouragement to the Pt for taking this next step in his path to recovery. Pt appeared receptive of the encouragement. Pt reported having no additional needs from Nursing Staff this evening. Pt's affect was appropriate.

## 2014-12-23 NOTE — BHH Group Notes (Signed)
[facility] LCSW Group Therapy  12/23/2014 1:15 pm  Type of Therapy: Process Group Therapy  Participation Level: Invited.  Chose to not attend  Summary of Progress/Problems: Today's group addressed the issue of overcoming obstacles.  Patients were asked to identify their biggest obstacle post d/c that stands in the way of their on-going success, and then problem solve as to how to manage this.  Trish Mage 12/23/2014   2:58 PM

## 2014-12-23 NOTE — BHH Group Notes (Signed)
[facility] LCSW Aftercare Discharge Planning Group Note   12/23/2014 2:56 PM  Participation Quality:  Minimal  Mood/Affect:  Grumpy  Depression Rating:  denies  Anxiety Rating:  denies  Thoughts of Suicide:  No Will you contract for safety?   NA  Current AVH:  No  Plan for Discharge/Comments:  "I'm ready to go.  What do I need to do to get out of here?"  Confirms that he is no longer 100% invested in going to [facility] from here.  "I got some things outside that I need to take care of.  Told him I would set up [facility] and [facility] as back up plans.  He left.  Transportation Means: mother  Supports: mother  Trish Mage

## 2014-12-23 NOTE — Progress Notes (Signed)
Patient in bed resting at the beginning of this shift. Mood and affect  flat and depressed. Patient seemed uninterested and unwilling to participate in the assessment; probably because he was feeling sleepy. He denied SI/HI and denied Hallucinations. Writer encouraged patient to communicate needs to staff if any. Q 15 minute check continues as ordered to maintain safety.
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
## 2014-12-24 DIAGNOSIS — K219 Gastro-esophageal reflux disease without esophagitis: Secondary | ICD-10-CM | POA: Insufficient documentation

## 2014-12-24 MED ORDER — BENZTROPINE MESYLATE 0.5 MG PO TABS: 0.5000 mg | ORAL_TABLET | Freq: Two times a day (BID) | ORAL | Status: DC

## 2014-12-24 MED ORDER — HALOPERIDOL 5 MG PO TABS: 5.0000 mg | ORAL_TABLET | Freq: Two times a day (BID) | ORAL | Status: DC

## 2014-12-24 MED ORDER — PANTOPRAZOLE SODIUM 20 MG PO TBEC: 20.0000 mg | DELAYED_RELEASE_TABLET | Freq: Two times a day (BID) | ORAL | Status: DC

## 2014-12-24 MED ORDER — TRAZODONE HCL 150 MG PO TABS: 150.0000 mg | ORAL_TABLET | Freq: Every day | ORAL | Status: DC

## 2014-12-24 MED ORDER — VITAMIN E 180 MG (400 UNIT) PO CAPS: 400.0000 [IU] | ORAL_CAPSULE | Freq: Every day | ORAL | Status: DC

## 2014-12-24 MED ORDER — CLONIDINE HCL 0.1 MG PO TABS: 0.1000 mg | ORAL_TABLET | Freq: Two times a day (BID) | ORAL | Status: DC

## 2014-12-24 MED ORDER — THIAMINE HCL 100 MG PO TABS: 100.0000 mg | ORAL_TABLET | Freq: Every day | ORAL | Status: DC

## 2014-12-24 MED ORDER — LAMOTRIGINE 25 MG PO TABS: 25.0000 mg | ORAL_TABLET | Freq: Every day | ORAL | Status: DC

## 2014-12-24 MED ORDER — FISH OIL 1000 MG PO CAPS: 1.0000 | ORAL_CAPSULE | Freq: Every day | ORAL | Status: DC

## 2014-12-24 MED ORDER — NICOTINE 21 MG/24HR TD PT24: 21.0000 mg | MEDICATED_PATCH | Freq: Every day | TRANSDERMAL | Status: DC

## 2014-12-24 MED ORDER — ADULT MULTIVITAMIN W/MINERALS CH: 1.0000 | ORAL_TABLET | Freq: Every day | ORAL | Status: DC

## 2014-12-24 NOTE — BHH Suicide Risk Assessment (Signed)
[facility] Discharge Suicide Risk Assessment   Demographic Factors:  Male, Caucasian and Unemployed  Total Time spent with patient: 30 minutes  Musculoskeletal: Strength & Muscle Tone: within normal limits Gait & Station: normal Patient leans: N/A  Psychiatric Specialty Exam: Physical Exam  ROS  Blood pressure 107/71, pulse 80, temperature 98.2 F (36.8 C), temperature source Oral, resp. rate 20, height 5\' 11"  (1.803 m), weight 118.842 kg (262 lb).Body mass index is 36.56 kg/(m^2).  General Appearance: Casual  Eye Contact::  Fair  Speech:  Clear and [id number]  Volume:  Normal  Mood:  Euthymic  Affect:  Appropriate  Thought Process:  Coherent  Orientation:  Full (Time, Place, and Person)  Thought Content:  WDL  Suicidal Thoughts:  No  Homicidal Thoughts:  No  Memory:  Immediate;   Fair Recent;   Fair Remote;   Fair  Judgement:  Fair  Insight:  Fair  Psychomotor Activity:  Normal  Concentration:  Fair  Recall:  [employer] of Knowledge:Fair  Language: Fair  Akathisia:  No  Handed:  Right  AIMS (if indicated):     Assets:  Communication Skills Desire for Improvement  Sleep:  Number of Hours: 6.75  Cognition: WNL  ADL's:  Intact   Have you used any form of tobacco in the last 30 days? (Cigarettes, Smokeless Tobacco, Cigars, and/or Pipes): Yes  Has this patient used any form of tobacco in the last 30 days? (Cigarettes, Smokeless Tobacco, Cigars, and/or Pipes) Yes, A prescription for nicotine patch for  tobacco cessation was given per patient request.     Mental Status Per Nursing Assessment::   On Admission:  Suicidal ideation indicated by patient, Self-harm thoughts  Current Mental Status by Physician: pt denies SI/HI/AH/VH  Loss Factors: Decrease in vocational status  Historical Factors: Impulsivity  Risk Reduction Factors:   Positive social support  Continued Clinical Symptoms:  Alcohol/Substance Abuse/Dependencies Previous Psychiatric Diagnoses and  Treatments  Cognitive Features That Contribute To Risk:  Polarized thinking    Suicide Risk:  Minimal: No identifiable suicidal ideation.  Patients presenting with no risk factors but with morbid ruminations; may be classified as minimal risk based on the severity of the depressive symptoms  Principal Problem: Schizoaffective disorder, unspecified type Discharge Diagnoses:  Patient Active Problem List   Diagnosis Date Noted  . GERD (gastroesophageal reflux disease) [K21.9] 12/23/2014  . Fracture of ischium [S32.609A] 12/20/2014  . Alcohol use disorder, severe, dependence [F10.20] 12/19/2014  . Schizoaffective disorder, unspecified type [F25.9] 01/12/2014    Class: Acute    Follow-up Information    Follow up with [facility] rehab On 12/25/2014.   Why:  Wednesday at [place] for your screening for admission appointment   Contact information:    [address]  [phone]      Follow up with [name] On 12/31/2014.   Why:  Tuesday at 9:20 with Dr Rico Sheehan information:   [place]  [[id number]] [place] [facility]/Follow-up recommendations:  Activity:  No restriction Diet:  regular Tests:  as needed Other:  follow up with [facility], [facility] as well as Primary care . Patient provided application for [facility] card as well as Primary care information.  Is patient on multiple antipsychotic therapies at discharge:  No   Has Patient had three or more failed trials of antipsychotic monotherapy by history:  No  Recommended Plan for Multiple Antipsychotic Therapies: NA  [name] md 12/24/2014, 9:41 AM

## 2014-12-24 NOTE — Progress Notes (Addendum)
Patient ID: Brian [name], male   DOB: 1986-03-20, 29 y.o.   MRN: [id number]  Pt. Denies SI/HI and A/V hallucinations. Belongings returned to patient at time of discharge. Patient denies any new onset of pain or discomfort. Discharge instructions and medications were reviewed with patient. Patient verbalized understanding of both medications and discharge instructions. Patient was escorted to lobby and bus pass was given. Q15 minute safety checks maintained until discharge. No distress upon discharge.
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
## 2014-12-24 NOTE — Progress Notes (Signed)
  [facility] Adult Case Management Discharge Plan :  Will you be returning to the same living situation after discharge:  Yes,  home wth mother At discharge, do you have transportation home?: Yes,  mother or bus pass Do you have the ability to pay for your medications: Yes,  mental health  Release of information consent forms completed and in the chart;  Patient's signature needed at discharge.  Patient to Follow up at: Follow-up Information    Follow up with [facility] rehab On 12/25/2014.   Why:  Wednesday at [place] for your screening for admission appointment   Contact information:    Tonita Phoenix  [place] [phone]      Follow up with [name] On 12/31/2014.   Why:  Tuesday at 9:20 with Dr Marily Memos.  Call them to reschedule if you go to [facility] information:   [place]  [phone]      Follow up with [facility].   Why:  Go to the walk-in clinic on Tuesday between 9 and noon for an assessment for outpt substance abuse treatment if you do not go to [facility], or after you get out   Contact information:   [place] [phone]      Follow up with [facility] On 12/30/2014.   Why:  9 AM on Monday to see the Dr about your ischium fracture  Call them to reschedule if you go to [place] information:   [place] [phone]      Patient denies SI/HI: Yes,  yes    Safety Planning and Suicide Prevention discussed: Yes,  yes  Have you used any form of tobacco in the last 30 days? (Cigarettes, Smokeless Tobacco, Cigars, and/or Pipes): Yes  Has patient been referred to the Quitline?: Yes, faxed on 12/24/14  Trish Mage 12/24/2014, 9:58 AM

## 2014-12-24 NOTE — Progress Notes (Signed)
Nutrition Education Note  RD consulted for nutrition education regarding a Heart Healthy diet.   Lipid Panel     Component Value Date/Time   CHOL 211* 12/20/2014 0656   TRIG 146 12/20/2014 0656   HDL 31* 12/20/2014 0656   CHOLHDL 6.8 12/20/2014 0656   VLDL 29 12/20/2014 0656   LDLCALC 151* 12/20/2014 0656    RD provided "High Cholesterol Nutrition Therapy" handout from the Academy of Nutrition and Dietetics. Reviewed patient's dietary recall. Provided examples on ways to decrease fat intake in diet. Discouraged intake of processed foods specifically fried foods as pt reports he eats these items often. Encouraged fresh fruits and vegetables as well as whole grain sources of carbohydrates to maximize fiber intake. Teach back method used.  Expect fair compliance.  Body mass index is 36.56 kg/(m^2). Pt meets criteria for obesity based on current BMI.  Current diet order is Regular, patient is consuming meals and snacks ad lib. Labs and medications reviewed. No further nutrition interventions warranted at this time. RD contact information provided. If additional nutrition issues arise, please re-consult RD.  Jarome Matin, RD, LDN Inpatient Clinical Dietitian Pager # [phone] After hours/weekend pager # [phone]

## 2014-12-24 NOTE — [facility] Group Notes (Signed)
[facility] Group Notes:  (Nursing/MHT/Case Management/Adjunct)  Date:  12/24/2014  Time:  10:20 AM  Type of Therapy:  Nurse Education  Participation Level:  Did Not Attend  Participation Quality:  Did not attend  Affect:  did not attend  Cognitive:  did not attend  Insight:  None  Engagement in Group:  None  Modes of Intervention:  Discussion and Education  Summary of Progress/Problems: The purpose of this group was to discuss the topic of the day which was Recovery. Discharge and new orientation procedures were discussed as well. Patient was invited but chose not to attend.   Leshea Jaggers E 12/24/2014, 10:20 AM

## 2014-12-24 NOTE — Tx Team (Signed)
Interdisciplinary Treatment Plan Update (Adult)  Date:  12/24/2014   Time Reviewed:  9:55 AM   Progress in Treatment: Attending groups: Yes. Participating in groups:  Yes. Taking medication as prescribed:  Yes. Tolerating medication:  Yes. Family/Significant othe contact made:   Patient understands diagnosis:  Yes  As evidenced by seeking help with Discussing patient identified problems/goals with staff:  Yes, see initial care plan. Medical problems stabilized or resolved:  Yes. Denies suicidal/homicidal ideation: Yes. Issues/concerns per patient self-inventory:  No. Other:  New problem(s) identified:  Discharge Plan or Barriers:  D/C today, follow up multiple providers  Reason for Continuation of Hospitalization:   Comments:  Estimated length of stay:  New goal(s):  Review of initial/current patient goals per problem list:      Attendees: Patient:  12/24/2014 9:55 AM   Family:   12/24/2014 9:55 AM   Physician:  Dr. Shea Evans, MD 12/24/2014 9:55 AM   Nursing:   Franco Nones, RN 12/24/2014 9:55 AM   Clinical Social Worker: Roque Lias, [place]   12/24/2014 9:55 AM   Other:  12/24/2014 9:55 AM   Other:   12/24/2014 9:55 AM   Other:  Lars Pinks, Nurse CM 12/24/2014 9:55 AM   Other:  Lucinda Dell, [facility] 12/24/2014 9:55 AM   Other:  Norberto Sorenson, [place]  12/24/2014 9:55 AM   Other:  12/24/2014 9:55 AM   Other:  12/24/2014 9:55 AM   Other:  12/24/2014 9:55 AM   Other:  12/24/2014 9:55 AM   Other:  12/24/2014 9:55 AM   Other:   12/24/2014 9:55 AM    Scribe for Treatment Team:   Trish Mage, 12/24/2014 9:55 AM

## 2014-12-30 ENCOUNTER — Inpatient Hospital Stay: Payer: Self-pay | Admitting: Family Medicine

## 2015-01-10 NOTE — ED Provider Notes (Signed)
CSN: [id number]     Arrival date & time 12/18/14  1656 History   First MD Initiated Contact with Patient 12/18/14 1811     Chief Complaint  Patient presents with  . Suicidal     HPI Pt reports suicidal thoughts with plan to hang self. Has been on medication for bipolar/schizophrenia for the past year and a half, but not working as well anymore. Having visual and auditory hallucinations telling him to kill himself and hurt others. No intention to hurt others. Has also started drinking etoh again, last drink last night. Past Medical History  Diagnosis Date  . PTSD (post-traumatic stress disorder)   . Anxiety   . Depression   . Bipolar 1 disorder   . Hepatitis C   . 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 Leg surgery     History reviewed. No pertinent family history. History  Substance Use Topics  . Smoking status: Current Every Day Smoker -- 1.00 packs/day    Types: Cigarettes  . Smokeless tobacco: Never Used  . Alcohol Use: Yes     Comment: 12-18 packs of beer/day    Review of Systems  All other systems reviewed and are negative.     Allergies  Phenytoin sodium extended; Pineapple; and Risperdal  Home Medications   Prior to Admission medications   Medication Sig Start Date End Date Taking? Authorizing Provider  ibuprofen (ADVIL,MOTRIN) 200 MG tablet Take 800 mg by mouth every 6 (six) hours as needed for mild pain or moderate pain.   Yes Historical Provider, MD  benztropine (COGENTIN) 0.5 MG tablet Take 1 tablet (0.5 mg total) by mouth 2 (two) times daily. 12/24/14   Niel Hummer, NP  cloNIDine (CATAPRES) 0.1 MG tablet Take 1 tablet (0.1 mg total) by mouth 2 (two) times daily. 12/24/14   Niel Hummer, NP  haloperidol (HALDOL) 5 MG tablet Take 1 tablet (5 mg total) by mouth 2 (two) times daily. 12/24/14   Niel Hummer, NP  lamoTRIgine (LAMICTAL) 25 MG tablet Take 1 tablet (25 mg total) by mouth daily. 12/24/14   Niel Hummer,  NP  Multiple Vitamin (MULTIVITAMIN WITH MINERALS) TABS tablet Take 1 tablet by mouth daily. May purchase over the counter to improve general health. 12/24/14   Niel Hummer, NP  nicotine (NICODERM CQ - DOSED IN MG/24 HOURS) 21 mg/24hr patch Place 1 patch (21 mg total) onto the skin daily at 6 (six) AM. 12/24/14   Niel Hummer, NP  Omega-3 Fatty Acids (FISH OIL) 1000 MG CAPS Take 1 capsule (1,000 mg total) by mouth daily. 12/24/14   Niel Hummer, NP  pantoprazole (PROTONIX) 20 MG tablet Take 1 tablet (20 mg total) by mouth 2 (two) times daily before a meal. 12/24/14   Niel Hummer, NP  thiamine 100 MG tablet Take 1 tablet (100 mg total) by mouth daily. 12/24/14   Niel Hummer, NP  traZODone (DESYREL) 150 MG tablet Take 1 tablet (150 mg total) by mouth at bedtime. 12/24/14   Niel Hummer, NP  vitamin E 400 UNIT capsule Take 1 capsule (400 Units total) by mouth daily. 12/24/14   Niel Hummer, NP   BP 134/91 mmHg  Pulse 89  Temp(Src) 98.4 F (36.9 C) (Oral)  Resp 16  SpO2 96% Physical Exam  Constitutional: He is oriented to person, place, and time. He appears well-developed and well-nourished. No distress.  HENT:  Head: Normocephalic and atraumatic.  Eyes: Pupils are equal, round, and reactive to light.  Neck: Normal range of motion.  Cardiovascular: Normal rate and intact distal pulses.   Pulmonary/Chest: No respiratory distress.  Abdominal: Normal appearance. He exhibits no distension.  Musculoskeletal: Normal range of motion.  Neurological: He is alert and oriented to person, place, and time. No cranial nerve deficit.  Skin: Skin is warm and dry. No rash noted.  Psychiatric: His behavior is normal. He exhibits a depressed mood. He expresses suicidal ideation.  Nursing note and vitals reviewed.   ED Course  Procedures (including critical care time) Labs Review Labs Reviewed  ACETAMINOPHEN LEVEL - Abnormal; Notable for the following:    Acetaminophen (Tylenol), Serum <10.0 (*)    All other  components within normal limits  CBC - Abnormal; Notable for the following:    MCH 34.1 (*)    All other components within normal limits  COMPREHENSIVE METABOLIC PANEL - Abnormal; Notable for the following:    Sodium 132 (*)    Glucose, Bld 140 (*)    AST 115 (*)    ALT 119 (*)    Total Bilirubin 1.5 (*)    All other components within normal limits  URINE RAPID DRUG SCREEN (HOSP PERFORMED) - Abnormal; Notable for the following:    Tetrahydrocannabinol POSITIVE (*)    All other components within normal limits  ETHANOL  SALICYLATE LEVEL    Imaging Review No results found.    MDM   Final diagnoses:  Suicidal behavior  Schizoaffective disorder, bipolar type        Leonard Schwartz, MD 01/10/15 1302

## 2015-03-18 ENCOUNTER — Ambulatory Visit: Payer: PRIVATE HEALTH INSURANCE | Attending: Internal Medicine

## 2015-06-05 ENCOUNTER — Ambulatory Visit: Payer: Self-pay | Admitting: Family Medicine

## 2015-06-12 ENCOUNTER — Other Ambulatory Visit: Payer: Self-pay | Admitting: Family Medicine

## 2015-06-12 ENCOUNTER — Encounter: Payer: Self-pay | Admitting: Family Medicine

## 2015-06-12 ENCOUNTER — Ambulatory Visit (INDEPENDENT_AMBULATORY_CARE_PROVIDER_SITE_OTHER): Payer: Self-pay | Admitting: Family Medicine

## 2015-06-12 VITALS — BP 134/82 | HR 84 | Temp 98.2°F | Resp 16 | Ht 72.0 in | Wt 276.0 lb

## 2015-06-12 DIAGNOSIS — Z23 Encounter for immunization: Secondary | ICD-10-CM

## 2015-06-12 DIAGNOSIS — E8881 Metabolic syndrome: Secondary | ICD-10-CM

## 2015-06-12 DIAGNOSIS — F1020 Alcohol dependence, uncomplicated: Secondary | ICD-10-CM

## 2015-06-12 DIAGNOSIS — F259 Schizoaffective disorder, unspecified: Secondary | ICD-10-CM

## 2015-06-12 DIAGNOSIS — M79604 Pain in right leg: Secondary | ICD-10-CM

## 2015-06-12 DIAGNOSIS — K029 Dental caries, unspecified: Secondary | ICD-10-CM

## 2015-06-12 DIAGNOSIS — I10 Essential (primary) hypertension: Secondary | ICD-10-CM

## 2015-06-12 LAB — POCT URINALYSIS DIP (DEVICE)
Bilirubin Urine: NEGATIVE
Glucose, UA: NEGATIVE mg/dL
Hgb urine dipstick: NEGATIVE
KETONES UR: NEGATIVE mg/dL
LEUKOCYTES UA: NEGATIVE
NITRITE: NEGATIVE
Protein, ur: NEGATIVE mg/dL
Specific Gravity, Urine: 1.02 (ref 1.005–1.030)
Urobilinogen, UA: 0.2 mg/dL (ref 0.0–1.0)
pH: 6 (ref 5.0–8.0)

## 2015-06-12 LAB — CBC WITH DIFFERENTIAL/PLATELET
Basophils Absolute: 0.1 10*3/uL (ref 0.0–0.1)
Basophils Relative: 1 % (ref 0–1)
Eosinophils Absolute: 0.2 10*3/uL (ref 0.0–0.7)
Eosinophils Relative: 3 % (ref 0–5)
HEMATOCRIT: 46.9 % (ref 39.0–52.0)
Hemoglobin: 16.6 g/dL (ref 13.0–17.0)
LYMPHS ABS: 2.4 10*3/uL (ref 0.7–4.0)
LYMPHS PCT: 42 % (ref 12–46)
MCH: 32.8 pg (ref 26.0–34.0)
MCHC: 35.4 g/dL (ref 30.0–36.0)
MCV: 92.7 fL (ref 78.0–100.0)
MONO ABS: 0.7 10*3/uL (ref 0.1–1.0)
MPV: 9.2 fL (ref 8.6–12.4)
Monocytes Relative: 12 % (ref 3–12)
Neutro Abs: 2.4 10*3/uL (ref 1.7–7.7)
Neutrophils Relative %: 42 % — ABNORMAL LOW (ref 43–77)
Platelets: 210 10*3/uL (ref 150–400)
RBC: 5.06 MIL/uL (ref 4.22–5.81)
RDW: 12.6 % (ref 11.5–15.5)
WBC: 5.7 10*3/uL (ref 4.0–10.5)

## 2015-06-12 LAB — COMPLETE METABOLIC PANEL WITH GFR
ALT: 125 U/L — ABNORMAL HIGH (ref 9–46)
AST: 109 U/L — AB (ref 10–40)
Albumin: 4.6 g/dL (ref 3.6–5.1)
Alkaline Phosphatase: 64 U/L (ref 40–115)
BUN: 6 mg/dL — AB (ref 7–25)
CHLORIDE: 102 mmol/L (ref 98–110)
CO2: 22 mmol/L (ref 20–31)
Calcium: 9.5 mg/dL (ref 8.6–10.3)
Creat: 0.96 mg/dL (ref 0.60–1.35)
GFR, Est African American: >89 mL/min (ref >=60)
Glucose, Bld: 76 mg/dL (ref 65–99)
Potassium: 4.4 mmol/L (ref 3.5–5.3)
Sodium: 135 mmol/L (ref 135–146)
Total Bilirubin: 0.9 mg/dL (ref 0.2–1.2)
Total Protein: 6.8 g/dL (ref 6.1–8.1)

## 2015-06-12 MED ORDER — CLONIDINE HCL 0.1 MG PO TABS: 0.1000 mg | ORAL_TABLET | Freq: Two times a day (BID) | ORAL | Status: DC

## 2015-06-12 MED ORDER — ACETAMINOPHEN 500 MG PO TABS: 500.0000 mg | ORAL_TABLET | Freq: Four times a day (QID) | ORAL | Status: DC | PRN
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
## 2015-06-13 LAB — HEMOGLOBIN A1C
Hgb A1c MFr Bld: 5.3 % (ref <5.7)
MEAN PLASMA GLUCOSE: 105 mg/dL (ref <117)

## 2015-06-13 LAB — TSH: TSH: 1.182 u[IU]/mL (ref 0.350–4.500)

## 2015-06-16 ENCOUNTER — Other Ambulatory Visit: Payer: Self-pay | Admitting: General Practice

## 2015-06-16 DIAGNOSIS — I10 Essential (primary) hypertension: Secondary | ICD-10-CM

## 2015-06-16 LAB — HEPATITIS PANEL, ACUTE
HCV AB: REACTIVE — AB
Hep A IgM: NONREACTIVE
Hep B C IgM: NONREACTIVE
Hepatitis B Surface Ag: NEGATIVE

## 2015-06-16 MED ORDER — CLONIDINE HCL 0.1 MG PO TABS: 0.1000 mg | ORAL_TABLET | Freq: Two times a day (BID) | ORAL | Status: DC

## 2015-06-16 NOTE — Telephone Encounter (Signed)
MEDICATION SENT INTO CORRECT PHARMACY. THANKS!

## 2015-06-18 LAB — HEPATITIS C RNA QUANTITATIVE

## 2015-07-24 ENCOUNTER — Telehealth: Payer: Self-pay | Admitting: Family Medicine

## 2015-07-24 NOTE — Telephone Encounter (Signed)
[place], can we refer him to dental clinic? Please advise. Thanks!

## 2015-07-24 NOTE — Telephone Encounter (Signed)
Patient calling for referral to dentist. Patient now has the [facility] card.

## 2015-07-30 ENCOUNTER — Telehealth: Payer: Self-pay | Admitting: Family Medicine

## 2015-07-30 NOTE — Telephone Encounter (Signed)
Received request from [employer] for medical records. Forwarded to [facility].  °

## 2015-09-12 ENCOUNTER — Ambulatory Visit: Payer: Self-pay | Admitting: Family Medicine

## 2015-09-18 ENCOUNTER — Ambulatory Visit: Payer: Self-pay | Admitting: Family Medicine

## 2015-10-06 ENCOUNTER — Ambulatory Visit: Payer: Self-pay | Admitting: Family Medicine

## 2015-10-08 ENCOUNTER — Ambulatory Visit: Payer: Self-pay | Attending: Internal Medicine

## 2016-01-14 ENCOUNTER — Emergency Department (HOSPITAL_COMMUNITY): Payer: Self-pay

## 2016-01-14 ENCOUNTER — Emergency Department (HOSPITAL_COMMUNITY)
Admission: EM | Admit: 2016-01-14 | Discharge: 2016-01-14 | Disposition: A | Discharge status: Home / Self Care | Payer: Self-pay | Attending: Emergency Medicine | Admitting: Emergency Medicine

## 2016-01-14 ENCOUNTER — Encounter (HOSPITAL_COMMUNITY): Payer: Self-pay

## 2016-01-14 DIAGNOSIS — R1011 Right upper quadrant pain: Secondary | ICD-10-CM | POA: Insufficient documentation

## 2016-01-14 DIAGNOSIS — R05 Cough: Secondary | ICD-10-CM

## 2016-01-14 DIAGNOSIS — R059 Cough, unspecified: Secondary | ICD-10-CM

## 2016-01-14 DIAGNOSIS — R079 Chest pain, unspecified: Secondary | ICD-10-CM | POA: Insufficient documentation

## 2016-01-14 DIAGNOSIS — F319 Bipolar disorder, unspecified: Secondary | ICD-10-CM | POA: Insufficient documentation

## 2016-01-14 DIAGNOSIS — F17210 Nicotine dependence, cigarettes, uncomplicated: Secondary | ICD-10-CM | POA: Insufficient documentation

## 2016-01-14 DIAGNOSIS — Z79899 Other long term (current) drug therapy: Secondary | ICD-10-CM | POA: Insufficient documentation

## 2016-01-14 LAB — COMPREHENSIVE METABOLIC PANEL
ALT: 37 U/L (ref 17–63)
ANION GAP: 9 (ref 5–15)
AST: 49 U/L — ABNORMAL HIGH (ref 15–41)
Albumin: 4.1 g/dL (ref 3.5–5.0)
Alkaline Phosphatase: 58 U/L (ref 38–126)
BILIRUBIN TOTAL: 0.8 mg/dL (ref 0.3–1.2)
BUN: 5 mg/dL — ABNORMAL LOW (ref 6–20)
CALCIUM: 9.3 mg/dL (ref 8.9–10.3)
CO2: 24 mmol/L (ref 22–32)
Chloride: 100 mmol/L — ABNORMAL LOW (ref 101–111)
Creatinine, Ser: 1.03 mg/dL (ref 0.61–1.24)
GFR calc Af Amer: >60 mL/min (ref >60)
GLUCOSE: 130 mg/dL — AB (ref 65–99)
Potassium: 4.1 mmol/L (ref 3.5–5.1)
Sodium: 133 mmol/L — ABNORMAL LOW (ref 135–145)
TOTAL PROTEIN: 7 g/dL (ref 6.5–8.1)

## 2016-01-14 LAB — URINALYSIS, ROUTINE W REFLEX MICROSCOPIC
BILIRUBIN URINE: NEGATIVE
Glucose, UA: NEGATIVE mg/dL
Hgb urine dipstick: NEGATIVE
Ketones, ur: NEGATIVE mg/dL
LEUKOCYTES UA: NEGATIVE
NITRITE: NEGATIVE
PH: 7 (ref 5.0–8.0)
Protein, ur: NEGATIVE mg/dL
SPECIFIC GRAVITY, URINE: 1.004 — AB (ref 1.005–1.030)

## 2016-01-14 LAB — CBC
HEMATOCRIT: 47.7 % (ref 39.0–52.0)
HEMOGLOBIN: 17 g/dL (ref 13.0–17.0)
MCH: 33.1 pg (ref 26.0–34.0)
MCHC: 35.6 g/dL (ref 30.0–36.0)
MCV: 92.8 fL (ref 78.0–100.0)
Platelets: 228 10*3/uL (ref 150–400)
RBC: 5.14 MIL/uL (ref 4.22–5.81)
RDW: 12.8 % (ref 11.5–15.5)
WBC: 8.9 10*3/uL (ref 4.0–10.5)

## 2016-01-14 LAB — LIPASE, BLOOD: LIPASE: 34 U/L (ref 11–51)

## 2016-01-14 MED ORDER — HYDROCODONE-ACETAMINOPHEN 5-325 MG PO TABS
1.0000 | ORAL_TABLET | Freq: Once | ORAL | Status: AC
Start: 2016-01-14 — End: 2016-01-14
  Administered 2016-01-14: 1 via ORAL
  Filled 2016-01-14: qty 1

## 2016-01-14 MED ORDER — HYDROCODONE-ACETAMINOPHEN 5-325 MG PO TABS: 1.0000 | ORAL_TABLET | Freq: Four times a day (QID) | ORAL | Status: DC | PRN

## 2016-01-14 MED ORDER — NAPROXEN 500 MG PO TABS: 500.0000 mg | ORAL_TABLET | Freq: Two times a day (BID) | ORAL | Status: DC | PRN

## 2016-01-14 MED ORDER — IOPAMIDOL (ISOVUE-370) INJECTION 76%
100.0000 mL | Freq: Once | INTRAVENOUS | Status: AC | PRN
  Administered 2016-01-14: 100 mL via INTRAVENOUS

## 2016-01-14 NOTE — ED Notes (Signed)
Patient c/o right upper abdominal  pain that radiates into the right mid back, and right shoulder x 3 daysr. Patient also c/o a productive cough with white sputum

## 2016-01-14 NOTE — Discharge Instructions (Signed)
You were seen and evaluated today for the pain in your chest when you cough. The exact cause is unclear but is likely related to a sprain in one of the muscles in your chest wall. Take the medications prescribed to help with your symptoms. Follow-up with your primary care physician for reevaluation.  Nonspecific Chest Pain  Chest pain can be caused by many different conditions. There is always a chance that your pain could be related to something serious, such as a heart attack or a blood clot in your lungs. Chest pain can also be caused by conditions that are not life-threatening. If you have chest pain, it is very important to follow up with your health care provider. CAUSES  Chest pain can be caused by:  Heartburn.  Pneumonia or bronchitis.  Anxiety or stress.  Inflammation around your heart (pericarditis) or lung (pleuritis or pleurisy).  A blood clot in your lung.  A collapsed lung (pneumothorax). It can develop suddenly on its own (spontaneous pneumothorax) or from trauma to the chest.  Shingles infection (varicella-zoster virus).  Heart attack.  Damage to the bones, muscles, and cartilage that make up your chest wall. This can include:  Bruised bones due to injury.  Strained muscles or cartilage due to frequent or repeated coughing or overwork.  Fracture to one or more ribs.  Sore cartilage due to inflammation (costochondritis). RISK FACTORS  Risk factors for chest pain may include:  Activities that increase your risk for trauma or injury to your chest.  Respiratory infections or conditions that cause frequent coughing.  Medical conditions or overeating that can cause heartburn.  Heart disease or family history of heart disease.  Conditions or health behaviors that increase your risk of developing a blood clot.  Having had chicken pox (varicella zoster). SIGNS AND SYMPTOMS Chest pain can feel like:  Burning or tingling on the surface of your chest or deep in  your chest.  Crushing, pressure, aching, or squeezing pain.  Dull or sharp pain that is worse when you move, cough, or take a deep breath.  Pain that is also felt in your back, neck, shoulder, or arm, or pain that spreads to any of these areas. Your chest pain may come and go, or it may stay constant. DIAGNOSIS Lab tests or other studies may be needed to find the cause of your pain. Your health care provider may have you take a test called an ambulatory ECG (electrocardiogram). An ECG records your heartbeat patterns at the time the test is performed. You may also have other tests, such as:  Transthoracic echocardiogram (TTE). During echocardiography, sound waves are used to create a picture of all of the heart structures and to look at how blood flows through your heart.  Transesophageal echocardiogram (TEE).This is a more advanced imaging test that obtains images from inside your body. It allows your health care provider to see your heart in finer detail.  Cardiac monitoring. This allows your health care provider to monitor your heart rate and rhythm in real time.  Holter monitor. This is a portable device that records your heartbeat and can help to diagnose abnormal heartbeats. It allows your health care provider to track your heart activity for several days, if needed.  Stress tests. These can be done through exercise or by taking medicine that makes your heart beat more quickly.  Blood tests.  Imaging tests. TREATMENT  Your treatment depends on what is causing your chest pain. Treatment may include:  Medicines. These may  include:  Acid blockers for heartburn.  Anti-inflammatory medicine.  Pain medicine for inflammatory conditions.  Antibiotic medicine, if an infection is present.  Medicines to dissolve blood clots.  Medicines to treat coronary artery disease.  Supportive care for conditions that do not require medicines. This may include:  Resting.  Applying heat or  cold packs to injured areas.  Limiting activities until pain decreases. HOME CARE INSTRUCTIONS  If you were prescribed an antibiotic medicine, finish it all even if you start to feel better.  Avoid any activities that bring on chest pain.  Do not use any tobacco products, including cigarettes, chewing tobacco, or electronic cigarettes. If you need help quitting, ask your health care provider.  Do not drink alcohol.  Take medicines only as directed by your health care provider.  Keep all follow-up visits as directed by your health care provider. This is important. This includes any further testing if your chest pain does not go away.  If heartburn is the cause for your chest pain, you may be told to keep your head raised (elevated) while sleeping. This reduces the chance that acid will go from your stomach into your esophagus.  Make lifestyle changes as directed by your health care provider. These may include:  Getting regular exercise. Ask your health care provider to suggest some activities that are safe for you.  Eating a heart-healthy diet. A registered dietitian can help you to learn healthy eating options.  Maintaining a healthy weight.  Managing diabetes, if necessary.  Reducing stress. SEEK MEDICAL CARE IF:  Your chest pain does not go away after treatment.  You have a rash with blisters on your chest.  You have a fever. SEEK IMMEDIATE MEDICAL CARE IF:   Your chest pain is worse.  You have an increasing cough, or you cough up blood.  You have severe abdominal pain.  You have severe weakness.  You faint.  You have chills.  You have sudden, unexplained chest discomfort.  You have sudden, unexplained discomfort in your arms, back, neck, or jaw.  You have shortness of breath at any time.  You suddenly start to sweat, or your skin gets clammy.  You feel nauseous or you vomit.  You suddenly feel light-headed or dizzy.  Your heart begins to beat quickly,  or it feels like it is skipping beats. These symptoms may represent a serious problem that is an emergency. Do not wait to see if the symptoms will go away. Get medical help right away. Call your local emergency services (911 in the [place] Do not drive yourself to the hospital.   This information is not intended to replace advice given to you by your health care provider. Make sure you discuss any questions you have with your health care provider.   Document Released: 05/19/2005 Document Revised: 08/30/2014 Document Reviewed: 03/15/2014 Elsevier Interactive Patient Education 2016 [employer].  Smoking Cessation, Tips for Success If you are ready to quit smoking, congratulations! You have chosen to help yourself be healthier. Cigarettes bring nicotine, tar, carbon monoxide, and other irritants into your body. Your lungs, heart, and blood vessels will be able to work better without these poisons. There are many different ways to quit smoking. Nicotine gum, nicotine patches, a nicotine inhaler, or nicotine nasal spray can help with physical craving. Hypnosis, support groups, and medicines help break the habit of smoking. WHAT THINGS CAN I DO TO MAKE QUITTING EASIER?  Here are some tips to help you quit for good:  Pick  a date when you will quit smoking completely. Tell all of your friends and family about your plan to quit on that date.  Do not try to slowly cut down on the number of cigarettes you are smoking. Pick a quit date and quit smoking completely starting on that day.  Throw away all cigarettes.   Clean and remove all ashtrays from your home, work, and car.  On a card, write down your reasons for quitting. Carry the card with you and read it when you get the urge to smoke.  Cleanse your body of nicotine. Drink enough water and fluids to keep your urine clear or pale yellow. Do this after quitting to flush the nicotine from your body.  Learn to predict your moods. Do not let a bad  situation be your excuse to have a cigarette. Some situations in your life might tempt you into wanting a cigarette.  Never have "just one" cigarette. It leads to wanting another and another. Remind yourself of your decision to quit.  Change habits associated with smoking. If you smoked while driving or when feeling stressed, try other activities to replace smoking. Stand up when drinking your coffee. Brush your teeth after eating. Sit in a different chair when you read the paper. Avoid alcohol while trying to quit, and try to drink fewer caffeinated beverages. Alcohol and caffeine may urge you to smoke.  Avoid foods and drinks that can trigger a desire to smoke, such as sugary or spicy foods and alcohol.  Ask people who smoke not to smoke around you.  Have something planned to do right after eating or having a cup of coffee. For example, plan to take a walk or exercise.  Try a relaxation exercise to calm you down and decrease your stress. Remember, you may be tense and nervous for the first 2 weeks after you quit, but this will pass.  Find new activities to keep your hands busy. Play with a pen, coin, or rubber band. Doodle or draw things on paper.  Brush your teeth right after eating. This will help cut down on the craving for the taste of tobacco after meals. You can also try mouthwash.   Use oral substitutes in place of cigarettes. Try using lemon drops, carrots, cinnamon sticks, or chewing gum. Keep them handy so they are available when you have the urge to smoke.  When you have the urge to smoke, try deep breathing.  Designate your home as a nonsmoking area.  If you are a heavy smoker, ask your health care provider about a prescription for nicotine chewing gum. It can ease your withdrawal from nicotine.  Reward yourself. Set aside the cigarette money you save and buy yourself something nice.  Look for support from others. Join a support group or smoking cessation program. Ask  someone at home or at work to help you with your plan to quit smoking.  Always ask yourself, "Do I need this cigarette or is this just a reflex?" Tell yourself, "Today, I choose not to smoke," or "I do not want to smoke." You are reminding yourself of your decision to quit.  Do not replace cigarette smoking with electronic cigarettes (commonly called e-cigarettes). The safety of e-cigarettes is unknown, and some may contain harmful chemicals.  If you relapse, do not give up! Plan ahead and think about what you will do the next time you get the urge to smoke. HOW WILL I FEEL WHEN I QUIT SMOKING? You may have symptoms  of withdrawal because your body is used to nicotine (the addictive substance in cigarettes). You may crave cigarettes, be irritable, feel very hungry, cough often, get headaches, or have difficulty concentrating. The withdrawal symptoms are only temporary. They are strongest when you first quit but will go away within 10-14 days. When withdrawal symptoms occur, stay in control. Think about your reasons for quitting. Remind yourself that these are signs that your body is healing and getting used to being without cigarettes. Remember that withdrawal symptoms are easier to treat than the major diseases that smoking can cause.  Even after the withdrawal is over, expect periodic urges to smoke. However, these cravings are generally short lived and will go away whether you smoke or not. Do not smoke! WHAT RESOURCES ARE AVAILABLE TO HELP ME QUIT SMOKING? Your health care provider can direct you to community resources or hospitals for support, which may include:  Group support.  Education.  Hypnosis.  Therapy.   This information is not intended to replace advice given to you by your health care provider. Make sure you discuss any questions you have with your health care provider.   Document Released: 05/07/2004 Document Revised: 08/30/2014 Document Reviewed: 01/25/2013 Elsevier  Interactive Patient Education [employer].

## 2016-01-14 NOTE — ED Provider Notes (Signed)
CSN: [id number]     Arrival date & time 01/14/16  1449 History   First MD Initiated Contact with Patient 01/14/16 1706     Chief Complaint  Patient presents with  . Abdominal Pain  . Cough     (Consider location/radiation/quality/duration/timing/severity/associated sxs/prior Treatment) HPI Comments: 30 year old male with history of schizoaffective disorder, alcohol use disorder presents for right lower chest and right upper quadrant abdominal pain. The patient reports he is a chronic smoker and has a chronic cough that has not changed. He says that recently he's had pain in the right lower chest and right upper quadrant when he is coughing. He reports that he also has some pain in his back in this similar area. Pain does seem to radiate to his right shoulder at times. No nausea or vomiting. Denies shortness of breath. No fevers or chills. No diarrhea or constipation.  Patient is a 30 y.o. male presenting with abdominal pain and cough.  Abdominal Pain Associated symptoms: chest pain (right lower chest pain) and cough   Associated symptoms: no chills, no constipation, no diarrhea, no dysuria, no fatigue, no fever, no hematuria, no nausea and no vomiting   Cough Associated symptoms: chest pain (right lower chest pain)   Associated symptoms: no chills, no fever, no headaches, no myalgias, no rash and no rhinorrhea     Past Medical History  Diagnosis Date  . PTSD (post-traumatic stress disorder)   . Anxiety   . Depression   . Bipolar 1 disorder ([place])   . Hepatitis C    Past Surgical History  Procedure Laterality Date  . Leg surgery     History reviewed. No pertinent family history. Social History  Substance Use Topics  . Smoking status: Current Every Day Smoker -- 1.00 packs/day    Types: Cigarettes  . Smokeless tobacco: Never Used  . Alcohol Use: No     Comment: 12-18 packs of beer/day    Review of Systems  Constitutional: Negative for fever, chills, appetite change and  fatigue.  HENT: Negative for congestion, postnasal drip and rhinorrhea.   Eyes: Negative for visual disturbance.  Respiratory: Positive for cough.   Cardiovascular: Positive for chest pain (right lower chest pain). Negative for palpitations and leg swelling.  Gastrointestinal: Positive for abdominal pain (RUQ abdominal pain). Negative for nausea, vomiting, diarrhea and constipation.  Genitourinary: Negative for dysuria, urgency and hematuria.  Musculoskeletal: Negative for myalgias and back pain.  Skin: Negative for rash.  Neurological: Negative for dizziness, weakness, light-headedness and headaches.  Hematological: Does not bruise/bleed easily.      Allergies  Phenytoin sodium extended; Pineapple; and Risperdal  Home Medications   Prior to Admission medications   Medication Sig Start Date End Date Taking? Authorizing Provider  benztropine (COGENTIN) 0.5 MG tablet Take 1 tablet (0.5 mg total) by mouth 2 (two) times daily. 12/24/14  Yes Niel Hummer, NP  cloNIDine (CATAPRES) 0.1 MG tablet Take 1 tablet (0.1 mg total) by mouth 2 (two) times daily. 06/16/15  Yes Dorena Dew, FNP  divalproex (DEPAKOTE) 500 MG DR tablet Take 1,000 mg by mouth at bedtime.    Yes Historical Provider, MD  haloperidol (HALDOL) 5 MG tablet Take 1 tablet (5 mg total) by mouth 2 (two) times daily. 12/24/14  Yes Niel Hummer, NP  hydrOXYzine (ATARAX/VISTARIL) 25 MG tablet Take 25 mg by mouth 3 (three) times daily.   Yes Historical Provider, MD  lamoTRIgine (LAMICTAL) 25 MG tablet Take 1 tablet (25 mg total) by  mouth daily. 12/24/14  Yes Niel Hummer, NP  traZODone (DESYREL) 150 MG tablet Take 1 tablet (150 mg total) by mouth at bedtime. 12/24/14  Yes Niel Hummer, NP  acetaminophen (TYLENOL) 500 MG tablet Take 1 tablet (500 mg total) by mouth every 6 (six) hours as needed. 06/12/15   Dorena Dew, FNP  HYDROcodone-acetaminophen (NORCO/VICODIN) 5-325 MG tablet Take 1 tablet by mouth every 6 (six) hours as  needed for severe pain. 01/14/16   Harvel Quale, MD  ibuprofen (ADVIL,MOTRIN) 200 MG tablet Take 800 mg by mouth every 6 (six) hours as needed for mild pain or moderate pain.    Historical Provider, MD  Multiple Vitamin (MULTIVITAMIN WITH MINERALS) TABS tablet Take 1 tablet by mouth daily. May purchase over the counter to improve general health. Patient not taking: Reported on 01/14/2016 12/24/14   Niel Hummer, NP  naproxen (NAPROSYN) 500 MG tablet Take 1 tablet (500 mg total) by mouth 2 (two) times daily as needed for mild pain or moderate pain. 01/14/16   Harvel Quale, MD  Omega-3 Fatty Acids (FISH OIL) 1000 MG CAPS Take 1 capsule (1,000 mg total) by mouth daily. Patient not taking: Reported on 01/14/2016 12/24/14   Niel Hummer, NP  thiamine 100 MG tablet Take 1 tablet (100 mg total) by mouth daily. Patient not taking: Reported on 01/14/2016 12/24/14   Niel Hummer, NP  vitamin E 400 UNIT capsule Take 1 capsule (400 Units total) by mouth daily. Patient not taking: Reported on 01/14/2016 12/24/14   Niel Hummer, NP   BP 124/73 mmHg  Pulse 65  Temp(Src) 97.7 F (36.5 C) (Oral)  Resp 18  SpO2 96% Physical Exam  Constitutional: He is oriented to person, place, and time. He appears well-developed and well-nourished. No distress.  HENT:  Head: Normocephalic and atraumatic.  Right Ear: External ear normal.  Left Ear: External ear normal.  Mouth/Throat: Oropharynx is clear and moist. No oropharyngeal exudate.  Eyes: EOM are normal. Pupils are equal, round, and reactive to light.  Neck: Normal range of motion. Neck supple.  Cardiovascular: Normal rate, regular rhythm, normal heart sounds and intact distal pulses.   No murmur heard. Pulmonary/Chest: Effort normal. No respiratory distress. He has no wheezes. He has no rales.  Abdominal: Soft. He exhibits no distension. There is tenderness (mild) in the right upper quadrant and epigastric area. There is no CVA tenderness.  Musculoskeletal: He  exhibits no edema.       Thoracic back: Normal.       Lumbar back: Normal.  Neurological: He is alert and oriented to person, place, and time.  Skin: Skin is warm and dry. No rash noted. He is not diaphoretic.  Vitals reviewed.   ED Course  Procedures (including critical care time) Labs Review Labs Reviewed  COMPREHENSIVE METABOLIC PANEL - Abnormal; Notable for the following:    Sodium 133 (*)    Chloride 100 (*)    Glucose, Bld 130 (*)    BUN 5 (*)    AST 49 (*)    All other components within normal limits  URINALYSIS, ROUTINE W REFLEX MICROSCOPIC (NOT AT [facility]) - Abnormal; Notable for the following:    Specific Gravity, Urine 1.004 (*)    All other components within normal limits  LIPASE, BLOOD  CBC    Imaging Review Dg Chest 2 View  01/14/2016  CLINICAL DATA:  Right side chest pain for 3 days EXAM: CHEST  2 VIEW COMPARISON:  December 12, 2014 FINDINGS: The heart size and mediastinal contours are within normal limits. There is minimal atelectasis of left lung base. There is no focal infiltrate, pulmonary edema, or pleural effusion. The visualized skeletal structures are unremarkable. IMPRESSION: No active cardiopulmonary disease. Minimal atelectasis of left lung base. Electronically Signed   By: Abelardo Diesel M.D.   On: 01/14/2016 18:18   Ct Angio Chest Pe W/cm &/or Wo Cm  01/14/2016  CLINICAL DATA:  Right upper quadrant pain radiating to right mid back and shoulders 3 days. Productive cough. EXAM: CT ANGIOGRAPHY CHEST WITH CONTRAST TECHNIQUE: Multidetector CT imaging of the chest was performed using the standard protocol during bolus administration of intravenous contrast. Multiplanar CT image reconstructions and MIPs were obtained to evaluate the vascular anatomy. CONTRAST:  80 mL Isovue 370 IV COMPARISON:  Chest x-ray 01/14/2016 and CT abdomen 01/12/2014 FINDINGS: Lungs are well inflated without consolidation or effusion. 3 mm nodule over the right lower lobe unchanged. 2 mm nodule  over the left base unchanged. Airways are within normal. Heart is normal in size with thoracic aorta is within normal. No evidence of pulmonary embolism. No mediastinal or hilar adenopathy. Remaining mediastinal structures are within normal. Images through the upper abdomen are within normal. Remaining bones and soft tissues are within normal. Review of the MIP images confirms the above findings. IMPRESSION: No evidence of pulmonary embolism. No acute cardiopulmonary disease. Electronically Signed   By: Marin Olp M.D.   On: 01/14/2016 22:17   US Abdomen Limited  01/14/2016  CLINICAL DATA:  Acute onset of right upper quadrant abdominal pain. Initial encounter. EXAM: US ABDOMEN LIMITED - RIGHT UPPER QUADRANT COMPARISON:  CT of the abdomen and pelvis performed 01/12/2014 FINDINGS: Gallbladder: No gallstones or wall thickening visualized. No sonographic Murphy sign noted by sonographer. Common bile duct: Diameter: 0.3 cm, within normal limits in caliber. Liver: No focal lesion identified. Diffusely increased parenchymal echogenicity and coarsened echotexture, compatible with fatty infiltration. IMPRESSION: 1. No acute abnormality seen at the right upper quadrant. 2. Diffuse fatty infiltration within the liver. Electronically Signed   By: Garald Balding M.D.   On: 01/14/2016 18:26   I have personally reviewed and evaluated these images and lab results as part of my medical decision-making.   EKG Interpretation None      MDM  Patient seen and evaluated in stable condition.  Patient with complaint of right upper quadrant abdominal pain and right lower chest pain made worse with coughing. Chest x-ray unremarkable. Right upper quadrant abdominal ultrasound unremarkable other than fatty infiltration of the liver. Laboratory studies unremarkable. CTA negative for PE. Patient was discharged home in stable condition with prescriptions for naproxen, Norco and instruction on smoking cessation. Final diagnoses:   Cough  Chest pain, unspecified chest pain type    1. Right upper quadrant abdominal pain 2. Chest pain    Harvel Quale, MD 01/15/16 0001

## 2016-06-18 ENCOUNTER — Encounter (HOSPITAL_COMMUNITY): Payer: Self-pay | Admitting: Emergency Medicine

## 2016-06-18 ENCOUNTER — Emergency Department (HOSPITAL_COMMUNITY)
Admission: EM | Admit: 2016-06-18 | Discharge: 2016-06-18 | Disposition: A | Discharge status: Home / Self Care | Payer: Self-pay | Attending: Emergency Medicine | Admitting: Emergency Medicine

## 2016-06-18 DIAGNOSIS — K0889 Other specified disorders of teeth and supporting structures: Secondary | ICD-10-CM | POA: Insufficient documentation

## 2016-06-18 DIAGNOSIS — F17210 Nicotine dependence, cigarettes, uncomplicated: Secondary | ICD-10-CM | POA: Insufficient documentation

## 2016-06-18 DIAGNOSIS — I10 Essential (primary) hypertension: Secondary | ICD-10-CM | POA: Insufficient documentation

## 2016-06-18 MED ORDER — NAPROXEN 500 MG PO TABS: 500.0000 mg | ORAL_TABLET | Freq: Two times a day (BID) | ORAL | 0 refills | Status: DC

## 2016-06-18 MED ORDER — PENICILLIN V POTASSIUM 500 MG PO TABS: 500.0000 mg | ORAL_TABLET | Freq: Four times a day (QID) | ORAL | 0 refills | Status: AC

## 2016-06-18 NOTE — ED Triage Notes (Signed)
Pt with very poor dentition, hx of schizophrenia, c/o upper and lower dental pain x 6 months. States has no insurance.

## 2016-06-18 NOTE — ED Provider Notes (Signed)
[facility] DEPT Provider Note   By signing my name below, I, Bea Graff, attest that this documentation has been prepared under the direction and in the presence of Isami Mehra, PA-C. Electronically Signed: Bea Graff, ED Scribe. 06/18/16. 3:21 PM.   History   Chief Complaint Chief Complaint  Patient presents with  . Dental Pain    The history is provided by the patient and medical records. No language interpreter was used.    HPI Comments:  Brian [name] is a 30 y.o. male who presents to the Emergency Department complaining of widespread dental pain that has been ongoing for several months. He reports associated chills. He has several missing teeth and has not seen a dentist in over a year. He has been taking Tylenol and ASA for pain without significant relief. Eating and drinking increase his pain. He denies alleviating factors. He denies fever, difficulty swallowing, drooling, trismus, nausea, vomiting, facial swelling.    Past Medical History:  Diagnosis Date  . Anxiety   . Bipolar 1 disorder ([place])   . Depression   . Hepatitis C   . PTSD (post-traumatic stress disorder)     Patient Active Problem List   Diagnosis Date Noted  . Essential hypertension 06/12/2015  . Esophageal reflux   . GERD (gastroesophageal reflux disease) 12/23/2014  . Fracture of ischium ([place]) 12/20/2014  . Alcohol use disorder, severe, dependence ([place]) 12/19/2014  . Schizoaffective disorder, unspecified type ([place]) 01/12/2014    Class: Acute    Past Surgical History:  Procedure Laterality Date  . LEG SURGERY        Home Medications    Prior to Admission medications   Medication Sig Start Date End Date Taking? Authorizing Provider  acetaminophen (TYLENOL) 500 MG tablet Take 1 tablet (500 mg total) by mouth every 6 (six) hours as needed. 06/12/15  Yes Dorena Dew, FNP  benztropine (COGENTIN) 0.5 MG tablet Take 1 tablet (0.5 mg total) by mouth 2 (two) times daily.  12/24/14  Yes Niel Hummer, NP  cloNIDine (CATAPRES) 0.1 MG tablet Take 1 tablet (0.1 mg total) by mouth 2 (two) times daily. 06/16/15  Yes Dorena Dew, FNP  divalproex (DEPAKOTE) 500 MG DR tablet Take 1,000 mg by mouth at bedtime.    Yes Historical Provider, MD  haloperidol (HALDOL) 5 MG tablet Take 1 tablet (5 mg total) by mouth 2 (two) times daily. 12/24/14  Yes Niel Hummer, NP  hydrOXYzine (ATARAX/VISTARIL) 25 MG tablet Take 25 mg by mouth 3 (three) times daily.   Yes Historical Provider, MD  ibuprofen (ADVIL,MOTRIN) 200 MG tablet Take 800 mg by mouth every 6 (six) hours as needed for mild pain or moderate pain.   Yes Historical Provider, MD  naproxen (NAPROSYN) 500 MG tablet Take 1 tablet (500 mg total) by mouth 2 (two) times daily as needed for mild pain or moderate pain. 01/14/16  Yes Harvel Quale, MD  traZODone (DESYREL) 150 MG tablet Take 1 tablet (150 mg total) by mouth at bedtime. 12/24/14  Yes Niel Hummer, NP  HYDROcodone-acetaminophen (NORCO/VICODIN) 5-325 MG tablet Take 1 tablet by mouth every 6 (six) hours as needed for severe pain. Patient not taking: Reported on 06/18/2016 01/14/16   Harvel Quale, MD  lamoTRIgine (LAMICTAL) 25 MG tablet Take 1 tablet (25 mg total) by mouth daily. Patient not taking: Reported on 06/18/2016 12/24/14   Niel Hummer, NP  Multiple Vitamin (MULTIVITAMIN WITH MINERALS) TABS tablet Take 1 tablet by mouth daily.  May purchase over the counter to improve general health. Patient not taking: Reported on 06/18/2016 12/24/14   Niel Hummer, NP  Omega-3 Fatty Acids (FISH OIL) 1000 MG CAPS Take 1 capsule (1,000 mg total) by mouth daily. Patient not taking: Reported on 06/18/2016 12/24/14   Niel Hummer, NP  thiamine 100 MG tablet Take 1 tablet (100 mg total) by mouth daily. Patient not taking: Reported on 06/18/2016 12/24/14   Niel Hummer, NP  vitamin E 400 UNIT capsule Take 1 capsule (400 Units total) by mouth daily. Patient not taking: Reported on  06/18/2016 12/24/14   Niel Hummer, NP    Family History No family history on file.  Social History Social History  Substance Use Topics  . Smoking status: Current Every Day Smoker    Packs/day: 1.00    Types: Cigarettes  . Smokeless tobacco: Never Used  . Alcohol use No     Comment: 12-18 packs of beer/day     Allergies   Phenytoin sodium extended; Pineapple; and Risperdal [risperidone]   Review of Systems Review of Systems A complete 10 system review of systems was obtained and all systems are negative except as noted in the HPI and PMH.    Physical Exam Updated Vital Signs BP 117/69 (BP Location: Right Arm)   Pulse 110   Temp 97.9 F (36.6 C) (Oral)   Resp 16   SpO2 96%   Physical Exam  Constitutional: He is oriented to person, place, and time. He appears well-developed and well-nourished.  HENT:  Head: Normocephalic and atraumatic.  Mouth/Throat: Uvula is midline, oropharynx is clear and moist and mucous membranes are normal. No trismus in the jaw. Abnormal dentition. Dental caries present. No dental abscesses.  Diffusely poor dentition with several missing teeth.  Tenderness to palpation and swelling to lower gingiva diffusely. No sublingual tenderness or swelling.  Neck: Normal range of motion.  Cardiovascular: Normal rate.   Pulmonary/Chest: Effort normal.  Musculoskeletal: Normal range of motion.  Lymphadenopathy:       Head (right side): No submental and no submandibular adenopathy present.       Head (left side): No submental and no submandibular adenopathy present.  Neurological: He is alert and oriented to person, place, and time.  Skin: Skin is warm and dry.  Psychiatric: He has a normal mood and affect. His behavior is normal.  Nursing note and vitals reviewed.    ED Treatments / Results  DIAGNOSTIC STUDIES: Oxygen Saturation is 96% on RA, adequate by my interpretation.   COORDINATION OF CARE: 3:19 PM- Will prescribe PCN and Naproxen. Advised  pt to follow up with a dentist ASAP. Pt verbalizes understanding and agrees to plan.  Medications - No data to display  Labs (all labs ordered are listed, but only abnormal results are displayed) Labs Reviewed - No data to display  EKG  EKG Interpretation None       Radiology No results found.  Procedures Procedures (including critical care time)  Medications Ordered in ED Medications - No data to display   Initial Impression / Assessment and Plan / ED Course  I have reviewed the triage vital signs and the nursing notes.  Pertinent labs & imaging results that were available during my care of the patient were reviewed by me and considered in my medical decision making (see chart for details).  Clinical Course    Patient with dentalgia.  No abscess requiring immediate incision and drainage.  Exam not concerning for Ludwig's  angina or pharyngeal abscess.  Will treat with PCN and Naproxen. Pt instructed to follow-up with dentist.  Discussed return precautions. Pt safe for discharge.  I personally performed the services described in this documentation, which was scribed in my presence. The recorded information has been reviewed and is accurate.   Final Clinical Impressions(s) / ED Diagnoses   Final diagnoses:  None    New Prescriptions New Prescriptions   No medications on file     Hyman Bible, PA-C 06/21/16 2318    Nat Christen, MD 06/23/16 1325

## 2016-06-18 NOTE — ED Notes (Signed)
Pt stable, understands discharge instructions, and reasons for return.   
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
## 2016-07-02 ENCOUNTER — Ambulatory Visit: Payer: Self-pay | Attending: Internal Medicine

## 2016-08-13 ENCOUNTER — Ambulatory Visit: Payer: Self-pay | Admitting: Family Medicine

## 2016-10-01 ENCOUNTER — Ambulatory Visit (HOSPITAL_COMMUNITY)
Admission: EM | Admit: 2016-10-01 | Discharge: 2016-10-01 | Disposition: A | Discharge status: Home / Self Care | Payer: Self-pay

## 2016-10-01 ENCOUNTER — Encounter (HOSPITAL_COMMUNITY): Payer: Self-pay | Admitting: Emergency Medicine

## 2016-10-01 DIAGNOSIS — S93602A Unspecified sprain of left foot, initial encounter: Secondary | ICD-10-CM

## 2016-10-01 NOTE — ED Provider Notes (Addendum)
[facility]    CSN: [id number] Arrival date & time: 10/01/16  1355     History   Chief Complaint No chief complaint on file.   HPI Brian [name] is a 31 y.o. male.  History of poor dentition treated with penicillin in the past in the emergency room 05/2016 Diagnosis of schizoaffective disorder--has had admissions in 2016 to behavioral [facility] for thoughts of suicide Patient has had history of overdose attempt History of severe alcohol abus Reflux  Presents to urgent care center 10/01/2016 with foot pain  Mom says he has been ill recently and patient was asked to taking his mother's antibiotics He fell out of bed and hurt himself by having his body followed the bed but his ankle was trapped between the bed O's and the bed He has redness over the lateral malleolus of the left foot  He is able to walk with a left  His mother has been giving him equal 8/generic ibuprofen 1 gram twice a day   HPI  Past Medical History:  Diagnosis Date  . Anxiety   . Bipolar 1 disorder ([place])   . Depression   . Hepatitis C   . PTSD (post-traumatic stress disorder)     Patient Active Problem List   Diagnosis Date Noted  . Essential hypertension 06/12/2015  . Esophageal reflux   . GERD (gastroesophageal reflux disease) 12/23/2014  . Fracture of ischium ([place]) 12/20/2014  . Alcohol use disorder, severe, dependence ([place]) 12/19/2014  . Schizoaffective disorder, unspecified type ([place]) 01/12/2014    Class: Acute    Past Surgical History:  Procedure Laterality Date  . LEG SURGERY         Home Medications    Prior to Admission medications   Medication Sig Start Date End Date Taking? Authorizing Provider  acetaminophen (TYLENOL) 500 MG tablet Take 1 tablet (500 mg total) by mouth every 6 (six) hours as needed. 06/12/15   Dorena Dew, FNP  benztropine (COGENTIN) 0.5 MG tablet Take 1 tablet (0.5 mg total) by mouth 2 (two) times daily. 12/24/14   Niel Hummer, NP    cloNIDine (CATAPRES) 0.1 MG tablet Take 1 tablet (0.1 mg total) by mouth 2 (two) times daily. 06/16/15   Dorena Dew, FNP  divalproex (DEPAKOTE) 500 MG DR tablet Take 1,000 mg by mouth at bedtime.     Historical Provider, MD  haloperidol (HALDOL) 5 MG tablet Take 1 tablet (5 mg total) by mouth 2 (two) times daily. 12/24/14   Niel Hummer, NP  HYDROcodone-acetaminophen (NORCO/VICODIN) 5-325 MG tablet Take 1 tablet by mouth every 6 (six) hours as needed for severe pain. Patient not taking: Reported on 06/18/2016 01/14/16   Harvel Quale, MD  hydrOXYzine (ATARAX/VISTARIL) 25 MG tablet Take 25 mg by mouth 3 (three) times daily.    Historical Provider, MD  ibuprofen (ADVIL,MOTRIN) 200 MG tablet Take 800 mg by mouth every 6 (six) hours as needed for mild pain or moderate pain.    Historical Provider, MD  lamoTRIgine (LAMICTAL) 25 MG tablet Take 1 tablet (25 mg total) by mouth daily. Patient not taking: Reported on 06/18/2016 12/24/14   Niel Hummer, NP  Multiple Vitamin (MULTIVITAMIN WITH MINERALS) TABS tablet Take 1 tablet by mouth daily. May purchase over the counter to improve general health. Patient not taking: Reported on 06/18/2016 12/24/14   Niel Hummer, NP  naproxen (NAPROSYN) 500 MG tablet Take 1 tablet (500 mg total) by mouth 2 (two) times daily.  06/18/16   Hyman Bible, PA-C  Omega-3 Fatty Acids (FISH OIL) 1000 MG CAPS Take 1 capsule (1,000 mg total) by mouth daily. Patient not taking: Reported on 06/18/2016 12/24/14   Niel Hummer, NP  thiamine 100 MG tablet Take 1 tablet (100 mg total) by mouth daily. Patient not taking: Reported on 06/18/2016 12/24/14   Niel Hummer, NP  traZODone (DESYREL) 150 MG tablet Take 1 tablet (150 mg total) by mouth at bedtime. 12/24/14   Niel Hummer, NP  vitamin E 400 UNIT capsule Take 1 capsule (400 Units total) by mouth daily. Patient not taking: Reported on 06/18/2016 12/24/14   Niel Hummer, NP    Family History No family history on file.  Social  History Social History  Substance Use Topics  . Smoking status: Current Every Day Smoker    Packs/day: 1.00    Types: Cigarettes  . Smokeless tobacco: Never Used  . Alcohol use No     Comment: 12-18 packs of beer/day     Allergies   Phenytoin sodium extended; Pineapple; and Risperdal [risperidone]   Review of Systems Review of Systems No fever No chills No nausea No vomiting No blurred vision No double vision No dark stool No taRRY stool  Physical Exam Triage Vital Signs ED Triage Vitals  Enc Vitals Group     BP      Pulse      Resp      Temp      Temp src      SpO2      Weight      Height      Head Circumference      Peak Flow      Pain Score      Pain Loc      Pain Edu?      Excl. in [place]?    No data found.   Updated Vital Signs There were no vitals taken for this visit.  Visual Acuity Right Eye Distance:   Left Eye Distance:   Bilateral Distance:    Right Eye Near:   Left Eye Near:    Bilateral Near:     Physical Exam Alert pleasant oriented no apparent distress EOMI flat affect No lymphadenopathy in the submandibular region Chest is clear S1-S2 no murmur rub or gallop Lateral malleolus of the left leg appears bruised patient does not have any palpable tenderness in the malleolus home and has diffuse swelling but no tenderness He is able to dorsi and plantar flex and he was able to walk at least 5-6 feet in the room without overt tenderness  UC Treatments / Results  Labs (all labs ordered are listed, but only abnormal results are displayed) Labs Reviewed - No data to display  EKG  EKG Interpretation None       Radiology No results found.  Procedures Procedures (including critical care time)  Medications Ordered in UC Medications - No data to display   Initial Impression / Assessment and Plan / UC Course  I have reviewed the triage vital signs and the nursing notes.  Pertinent labs & imaging results that were available  during my care of the patient were reviewed by me and considered in my medical decision making (see chart for details).     Patient has a probable sprain of the lateral ankle involving the anteroir Tibiofibular/Deltoid ligaments  Final Clinical Impressions(s) / UC Diagnoses   Final diagnoses:  None   L Lat sprain Patient has  been advised to ice and elevate the L leg He will be given an air stirrup brace and if he does not feel any better over the next 5-6 days and would recommend imaging of this area.  I have had long discussion with his mother who he lives with and she understands the plan  New Prescriptions New Prescriptions   No medications on file     Nita Sells, MD 10/01/16 [place], MD 10/01/16 1446

## 2016-10-01 NOTE — ED Triage Notes (Signed)
Pt reports he fell off bed 2 days ago 2-3 ft from carpet flooring  Sx today include: swelling, bruising... Pain increases w/activity.   Slow gait... A&O x4... NAD

## 2016-10-01 NOTE — Discharge Instructions (Signed)
You have been diagnosed with an ankle sprain of the left leg which probably is a mild sprain I would suggest icing the area as much as possible [get a bucket filled with 1 cup of water and for the rest of it with ice and put the foot in this 10 minutes at a time and remove it ] I do think that if however your pain is worse, you would benefit from x-rays of this area You may use the equivalent of 750 mg of ibuprofen 3 times a day instead of 1 g twice a day for continued relief Please place the brace on the leg every time he moves around as this will prevent you from worsening and it is recommended that you move around to customize your body to mobility

## 2016-10-03 ENCOUNTER — Encounter (HOSPITAL_COMMUNITY): Payer: Self-pay

## 2016-10-03 ENCOUNTER — Emergency Department (HOSPITAL_COMMUNITY)
Admission: EM | Admit: 2016-10-03 | Discharge: 2016-10-03 | Disposition: A | Discharge status: Home / Self Care | Payer: Self-pay | Attending: Emergency Medicine | Admitting: Emergency Medicine

## 2016-10-03 ENCOUNTER — Emergency Department (HOSPITAL_COMMUNITY): Payer: Self-pay

## 2016-10-03 DIAGNOSIS — I10 Essential (primary) hypertension: Secondary | ICD-10-CM | POA: Insufficient documentation

## 2016-10-03 DIAGNOSIS — W06XXXA Fall from bed, initial encounter: Secondary | ICD-10-CM | POA: Insufficient documentation

## 2016-10-03 DIAGNOSIS — Z79899 Other long term (current) drug therapy: Secondary | ICD-10-CM | POA: Insufficient documentation

## 2016-10-03 DIAGNOSIS — Y929 Unspecified place or not applicable: Secondary | ICD-10-CM | POA: Insufficient documentation

## 2016-10-03 DIAGNOSIS — F17210 Nicotine dependence, cigarettes, uncomplicated: Secondary | ICD-10-CM | POA: Insufficient documentation

## 2016-10-03 DIAGNOSIS — S92345A Nondisplaced fracture of fourth metatarsal bone, left foot, initial encounter for closed fracture: Secondary | ICD-10-CM | POA: Insufficient documentation

## 2016-10-03 DIAGNOSIS — Y999 Unspecified external cause status: Secondary | ICD-10-CM | POA: Insufficient documentation

## 2016-10-03 DIAGNOSIS — Y939 Activity, unspecified: Secondary | ICD-10-CM | POA: Insufficient documentation

## 2016-10-03 MED ORDER — IBUPROFEN 800 MG PO TABS: 800.0000 mg | ORAL_TABLET | Freq: Three times a day (TID) | ORAL | 0 refills | Status: DC

## 2016-10-03 NOTE — Discharge Instructions (Signed)
You have evidence of a fracture in the foot. Wear the boot for support and the crutches to stay off of the extremity. Keep the extremity elevated whenever possible. May use ibuprofen or naproxen to reduce pain and inflammation. Follow up with orthopedist as soon as possible for chronic management of this issue.

## 2016-10-03 NOTE — ED Provider Notes (Signed)
[facility] DEPT Provider Note   CSN: [id number] Arrival date & time: 10/03/16  1348     History   Chief Complaint Chief Complaint  Patient presents with  . Ankle Pain    LEFT    HPI Brian [name] is a 31 y.o. male.  HPI   Brian [name] is a 31 y.o. male, with a history of Hepatitis C and bipolar disorder, presenting to the ED with Left foot injury that occurred 5 days ago. Patient states that he was drinking alcohol, tripped, and caught his foot on something. His pain is moderate, aching/throbbing, located on the dorsal lateral surface of the left foot, nonradiating. He has intermittently been taking ibuprofen with some relief. He has been walking on it. He has not been elevating it. He denies neuro deficits, head injury, neck/back pain, or any other complaints.      Past Medical History:  Diagnosis Date  . Anxiety   . Bipolar 1 disorder ([place])   . Depression   . Hepatitis C   . PTSD (post-traumatic stress disorder)     Patient Active Problem List   Diagnosis Date Noted  . Sprain of left foot 10/01/2016  . Essential hypertension 06/12/2015  . Esophageal reflux   . GERD (gastroesophageal reflux disease) 12/23/2014  . Fracture of ischium ([place]) 12/20/2014  . Alcohol use disorder, severe, dependence ([place]) 12/19/2014  . Schizoaffective disorder, unspecified type ([place]) 01/12/2014    Class: Acute    Past Surgical History:  Procedure Laterality Date  . LEG SURGERY         Home Medications    Prior to Admission medications   Medication Sig Start Date End Date Taking? Authorizing Provider  acetaminophen (TYLENOL) 500 MG tablet Take 1 tablet (500 mg total) by mouth every 6 (six) hours as needed. 06/12/15   Dorena Dew, FNP  benztropine (COGENTIN) 0.5 MG tablet Take 1 tablet (0.5 mg total) by mouth 2 (two) times daily. 12/24/14   Niel Hummer, NP  cloNIDine (CATAPRES) 0.1 MG tablet Take 1 tablet (0.1 mg total) by mouth 2 (two) times daily. 06/16/15   Dorena Dew, FNP  divalproex (DEPAKOTE) 500 MG DR tablet Take 1,000 mg by mouth at bedtime.     Historical Provider, MD  haloperidol (HALDOL) 5 MG tablet Take 1 tablet (5 mg total) by mouth 2 (two) times daily. 12/24/14   Niel Hummer, NP  HYDROcodone-acetaminophen (NORCO/VICODIN) 5-325 MG tablet Take 1 tablet by mouth every 6 (six) hours as needed for severe pain. Patient not taking: Reported on 06/18/2016 01/14/16   Harvel Quale, MD  hydrOXYzine (ATARAX/VISTARIL) 25 MG tablet Take 25 mg by mouth 3 (three) times daily.    Historical Provider, MD  ibuprofen (ADVIL,MOTRIN) 200 MG tablet Take 800 mg by mouth every 6 (six) hours as needed for mild pain or moderate pain.    Historical Provider, MD  ibuprofen (ADVIL,MOTRIN) 800 MG tablet Take 1 tablet (800 mg total) by mouth 3 (three) times daily. 10/03/16   Tomicka Lover C [place] [employer], PA-C  lamoTRIgine (LAMICTAL) 25 MG tablet Take 1 tablet (25 mg total) by mouth daily. 12/24/14   Niel Hummer, NP  Multiple Vitamin (MULTIVITAMIN WITH MINERALS) TABS tablet Take 1 tablet by mouth daily. May purchase over the counter to improve general health. 12/24/14   Niel Hummer, NP  naproxen (NAPROSYN) 500 MG tablet Take 1 tablet (500 mg total) by mouth 2 (two) times daily. 06/18/16   Hyman Bible, PA-C  Omega-3  Fatty Acids (FISH OIL) 1000 MG CAPS Take 1 capsule (1,000 mg total) by mouth daily. Patient not taking: Reported on 06/18/2016 12/24/14   Niel Hummer, NP  thiamine 100 MG tablet Take 1 tablet (100 mg total) by mouth daily. Patient not taking: Reported on 06/18/2016 12/24/14   Niel Hummer, NP  traZODone (DESYREL) 150 MG tablet Take 1 tablet (150 mg total) by mouth at bedtime. 12/24/14   Niel Hummer, NP  vitamin E 400 UNIT capsule Take 1 capsule (400 Units total) by mouth daily. Patient not taking: Reported on 06/18/2016 12/24/14   Niel Hummer, NP    Family History History reviewed. No pertinent family history.  Social History Social History  Substance Use Topics  .  Smoking status: Current Every Day Smoker    Packs/day: 1.00    Types: Cigarettes  . Smokeless tobacco: Never Used  . Alcohol use Yes     Comment: 12-18 packs of beer/ OCCASIONALLY     Allergies   Phenytoin sodium extended; Pineapple; and Risperdal [risperidone]   Review of Systems Review of Systems  Musculoskeletal: Positive for arthralgias. Negative for back pain and neck pain.  Neurological: Negative for weakness and numbness.     Physical Exam Updated Vital Signs BP 126/75 (BP Location: Left Arm)   Pulse 100   Temp 98 F (36.7 C) (Oral)   Resp 16   Ht 5\' 9"  (1.753 m)   Wt 119.7 kg   SpO2 97%   BMI 38.99 kg/m   Physical Exam  Constitutional: He appears well-developed and well-nourished. No distress.  HENT:  Head: Normocephalic and atraumatic.  Eyes: Conjunctivae are normal.  Neck: Neck supple.  Cardiovascular: Normal rate, regular rhythm and intact distal pulses.   Pulmonary/Chest: Effort normal.  Musculoskeletal: He exhibits edema and tenderness.  Point tenderness over the proximal fourth metatarsal with associated swelling and ecchymosis. Ecchymosis extends into the distal foot. Range of motion in the left ankle. Motor function intact in the left toes.  Neurological: He is alert.  No sensory deficits in the left foot. Strength 5 out of 5 in the left ankle and left foot.  Skin: Skin is warm and dry. Capillary refill takes less than 2 seconds. He is not diaphoretic.  Psychiatric: He has a normal mood and affect. His behavior is normal.  Nursing note and vitals reviewed.    ED Treatments / Results  Labs (all labs ordered are listed, but only abnormal results are displayed) Labs Reviewed - No data to display  EKG  EKG Interpretation None       Radiology Dg Ankle Complete Left  Result Date: 10/03/2016 CLINICAL DATA:  Pain after trauma EXAM: LEFT ANKLE COMPLETE - 3+ VIEW COMPARISON:  None. FINDINGS: Soft tissue swelling is seen, particularly laterally.  No acute fractures are identified. IMPRESSION: Soft tissue swelling.  No visualized fracture. Electronically Signed   By: Dorise Bullion III M.D   On: 10/03/2016 14:26   Dg Foot Complete Left  Result Date: 10/03/2016 CLINICAL DATA:  Pain after fall EXAM: LEFT FOOT - COMPLETE 3+ VIEW COMPARISON:  None. FINDINGS: There is a lucency through the base of the fourth metatarsal on AP imaging with an unusual contour more on oblique imaging. No other evidence of fracture. Swelling over the dorsum of the foot. IMPRESSION: Apparent fracture through the base of the fourth metatarsal. Electronically Signed   By: Dorise Bullion III M.D   On: 10/03/2016 14:31    Procedures Procedures (including critical  care time)  Medications Ordered in ED Medications - No data to display   Initial Impression / Assessment and Plan / ED Course  I have reviewed the triage vital signs and the nursing notes.  Pertinent labs & imaging results that were available during my care of the patient were reviewed by me and considered in my medical decision making (see chart for details).      Patient presents with a left foot injury. Fourth metatarsal fracture on x-ray. Orthopedic follow-up. Home care and return precautions discussed. Patient voices understanding of all instructions and is comfortable with discharge.    Final Clinical Impressions(s) / ED Diagnoses   Final diagnoses:  Closed nondisplaced fracture of fourth metatarsal bone of left foot, initial encounter    New Prescriptions New Prescriptions   IBUPROFEN (ADVIL,MOTRIN) 800 MG TABLET    Take 1 tablet (800 mg total) by mouth 3 (three) times daily.     Lorayne Bender, PA-C 10/03/16 1615    Virgel Manifold, MD 10/11/16 [phone]

## 2016-10-03 NOTE — ED Triage Notes (Signed)
PT C/O LEFT ANKLE PAIN AND SWELLING X3-4 DAYS AGO.PT STS HE WAS DRINKING AND FELL OFF OF HIS BED, GETTING HIS ANKLE CAUGHT IN THE RAILING. PT HAS A LACE WRAP ON IT AT PRESENT.

## 2016-10-21 ENCOUNTER — Ambulatory Visit (INDEPENDENT_AMBULATORY_CARE_PROVIDER_SITE_OTHER): Payer: Self-pay | Admitting: Physician Assistant

## 2016-10-21 ENCOUNTER — Ambulatory Visit (INDEPENDENT_AMBULATORY_CARE_PROVIDER_SITE_OTHER): Payer: Self-pay

## 2016-10-21 ENCOUNTER — Encounter (INDEPENDENT_AMBULATORY_CARE_PROVIDER_SITE_OTHER): Payer: Self-pay | Admitting: Physician Assistant

## 2016-10-21 DIAGNOSIS — S92345A Nondisplaced fracture of fourth metatarsal bone, left foot, initial encounter for closed fracture: Secondary | ICD-10-CM

## 2016-10-21 NOTE — Progress Notes (Signed)
Office Visit Note   Patient: Brian [name]           Date of Birth: 1986/05/08           MRN: [id number] Visit Date: 10/21/2016              Requested by: No referring provider defined for this encounter. PCP: No PCP Per Patient   Assessment & Plan: Visit Diagnoses:  1. Nondisplaced fracture of fourth metatarsal bone, left foot, initial encounter for closed fracture     Plan: We will have him stay in the [facility] for another 2 weeks. Then transition to a regular shoe with the lace up ASO that he has for 2 weeks when outside of the [facility] inside the home he does not need to wear the brace. Elevation ice encouraged. He did ask about pain medicine I feel at this point time and sees had no pain medicine for almost a month at that I would not I would prefer not to start him on any type of narcotic he will continue his Tylenol and ibuprofen. We'll see him back in 1 month check progress lack of. Would Not obtain x-rays unless clinically indicated  Follow-Up Instructions: Return in about 4 weeks (around 11/18/2016).   Orders:  Orders Placed This Encounter  Procedures  . XR Foot Complete Left   No orders of the defined types were placed in this encounter.     Procedures: No procedures performed   Clinical Data: No additional findings.   Subjective: Chief Complaint  Patient presents with  . Left Foot - Follow-up, Fracture    Brian [name] is here to follow up on hi left 4th metatarsal fracture. On 09/27/2016 he was in his bed and fell out of his bed caught his foot in the bed rail. Denies any other injury outside of foot and ankle. He was seen on 10/03/2016 at the ED for his ankle.  He states that it is swollen and its painful.  He was placed in fracture boot but he is not wearing it at night.      Review of Systems Pain swelling left foot. Left ankle pain No Fevers or chills.  Objective: Vital Signs: There were no vitals taken for this visit.  Physical Exam    Constitutional: He is oriented to person, place, and time. He appears well-developed and well-nourished. No distress.  Neurological: He is alert and oriented to person, place, and time.  Skin: Skin is warm and dry. No rash noted. No erythema.    Ortho Exam Left foot he has tenderness over the base of the fourth metatarsal. Some tenderness over the lesser metatarsal basis also pain over the lateral malleolus and talofibular ligament region. Is able to dorsiflex plantar flex the ankle without significant pain. Positive PNA Key tests fourth metatarsal only. Remainder of the foot and ankle nontender. Specialty Comments:  No specialty comments available.  Imaging: Xr Foot Complete Left  Result Date: 10/21/2016 3 views of the left foot: Displaced base of the fourth metatarsal fracture was some callous formation lateral aspect of the base metatarsal. Otherwise no bony abnormalities seen throughout the foot. The ankle mortise appears grossly intact on the on the non- dedicated views of the ankle.    PMFS History: Patient Active Problem List   Diagnosis Date Noted  . Sprain of left foot 10/01/2016  . Essential hypertension 06/12/2015  . Esophageal reflux   . GERD (gastroesophageal reflux disease) 12/23/2014  . Fracture of ischium (  [place]) 12/20/2014  . Alcohol use disorder, severe, dependence ([place]) 12/19/2014  . Schizoaffective disorder, unspecified type ([place]) 01/12/2014    Class: Acute   Past Medical History:  Diagnosis Date  . Anxiety   . Bipolar 1 disorder ([place])   . Depression   . Hepatitis C   . PTSD (post-traumatic stress disorder)     No family history on file.  Past Surgical History:  Procedure Laterality Date  . LEG SURGERY     Social History   Occupational History  . Not on file.   Social History Main Topics  . Smoking status: Current Every Day Smoker    Packs/day: 1.00    Types: Cigarettes  . Smokeless tobacco: Never Used  . Alcohol use Yes     Comment: 12-18 packs  of beer/ OCCASIONALLY  . Drug use: Yes    Types: Methamphetamines, Heroin, Marijuana, IV     Comment: "huffs" air canisters   . Sexual activity: Not Currently

## 2016-11-15 ENCOUNTER — Ambulatory Visit (INDEPENDENT_AMBULATORY_CARE_PROVIDER_SITE_OTHER): Payer: Self-pay | Admitting: Physician Assistant

## 2017-02-22 ENCOUNTER — Emergency Department (HOSPITAL_COMMUNITY): Payer: Self-pay

## 2017-02-22 ENCOUNTER — Encounter (HOSPITAL_COMMUNITY): Payer: Self-pay | Admitting: Emergency Medicine

## 2017-02-22 ENCOUNTER — Emergency Department (HOSPITAL_COMMUNITY)
Admission: EM | Admit: 2017-02-22 | Discharge: 2017-02-22 | Disposition: A | Discharge status: Home / Self Care | Payer: Self-pay | Attending: Emergency Medicine | Admitting: Emergency Medicine

## 2017-02-22 DIAGNOSIS — M549 Dorsalgia, unspecified: Secondary | ICD-10-CM | POA: Insufficient documentation

## 2017-02-22 DIAGNOSIS — I10 Essential (primary) hypertension: Secondary | ICD-10-CM | POA: Insufficient documentation

## 2017-02-22 DIAGNOSIS — Y999 Unspecified external cause status: Secondary | ICD-10-CM | POA: Insufficient documentation

## 2017-02-22 DIAGNOSIS — F10920 Alcohol use, unspecified with intoxication, uncomplicated: Secondary | ICD-10-CM | POA: Insufficient documentation

## 2017-02-22 DIAGNOSIS — Z79899 Other long term (current) drug therapy: Secondary | ICD-10-CM | POA: Insufficient documentation

## 2017-02-22 DIAGNOSIS — F17210 Nicotine dependence, cigarettes, uncomplicated: Secondary | ICD-10-CM | POA: Insufficient documentation

## 2017-02-22 DIAGNOSIS — Z791 Long term (current) use of non-steroidal anti-inflammatories (NSAID): Secondary | ICD-10-CM | POA: Insufficient documentation

## 2017-02-22 DIAGNOSIS — W19XXXA Unspecified fall, initial encounter: Secondary | ICD-10-CM | POA: Insufficient documentation

## 2017-02-22 DIAGNOSIS — Y9389 Activity, other specified: Secondary | ICD-10-CM | POA: Insufficient documentation

## 2017-02-22 DIAGNOSIS — R4182 Altered mental status, unspecified: Secondary | ICD-10-CM | POA: Insufficient documentation

## 2017-02-22 DIAGNOSIS — Y92009 Unspecified place in unspecified non-institutional (private) residence as the place of occurrence of the external cause: Secondary | ICD-10-CM | POA: Insufficient documentation

## 2017-02-22 LAB — CBC WITH DIFFERENTIAL/PLATELET
BASOS ABS: 0 10*3/uL (ref 0.0–0.1)
BASOS PCT: 0 %
EOS ABS: 0.1 10*3/uL (ref 0.0–0.7)
Eosinophils Relative: 2 %
HCT: 44.1 % (ref 39.0–52.0)
HEMOGLOBIN: 16.1 g/dL (ref 13.0–17.0)
Lymphocytes Relative: 30 %
Lymphs Abs: 1.9 10*3/uL (ref 0.7–4.0)
MCH: 33.1 pg (ref 26.0–34.0)
MCHC: 36.5 g/dL — AB (ref 30.0–36.0)
MCV: 90.6 fL (ref 78.0–100.0)
MONOS PCT: 8 %
Monocytes Absolute: 0.5 10*3/uL (ref 0.1–1.0)
NEUTROS PCT: 60 %
Neutro Abs: 3.8 10*3/uL (ref 1.7–7.7)
Platelets: 250 10*3/uL (ref 150–400)
RBC: 4.87 MIL/uL (ref 4.22–5.81)
RDW: 12.2 % (ref 11.5–15.5)
WBC: 6.4 10*3/uL (ref 4.0–10.5)

## 2017-02-22 LAB — COMPREHENSIVE METABOLIC PANEL
ALK PHOS: 64 U/L (ref 38–126)
ALT: 41 U/L (ref 17–63)
ANION GAP: 10 (ref 5–15)
AST: 56 U/L — ABNORMAL HIGH (ref 15–41)
Albumin: 3.6 g/dL (ref 3.5–5.0)
BILIRUBIN TOTAL: 0.9 mg/dL (ref 0.3–1.2)
BUN: <5 mg/dL — ABNORMAL LOW (ref 6–20)
CALCIUM: 8.7 mg/dL — AB (ref 8.9–10.3)
CO2: 23 mmol/L (ref 22–32)
CREATININE: 0.81 mg/dL (ref 0.61–1.24)
Chloride: 100 mmol/L — ABNORMAL LOW (ref 101–111)
GFR calc non Af Amer: >60 mL/min (ref >60)
Glucose, Bld: 106 mg/dL — ABNORMAL HIGH (ref 65–99)
Potassium: 4.1 mmol/L (ref 3.5–5.1)
SODIUM: 133 mmol/L — AB (ref 135–145)
TOTAL PROTEIN: 6.2 g/dL — AB (ref 6.5–8.1)

## 2017-02-22 LAB — RAPID URINE DRUG SCREEN, HOSP PERFORMED
Amphetamines: NOT DETECTED
BENZODIAZEPINES: POSITIVE — AB
Barbiturates: NOT DETECTED
COCAINE: NOT DETECTED
OPIATES: NOT DETECTED
Tetrahydrocannabinol: POSITIVE — AB

## 2017-02-22 LAB — ETHANOL: Alcohol, Ethyl (B): 174 mg/dL — ABNORMAL HIGH (ref <5)

## 2017-02-22 MED ORDER — ONDANSETRON 8 MG PO TBDP: 8.0000 mg | ORAL_TABLET | Freq: Once | ORAL | Status: DC

## 2017-02-22 MED ORDER — LORAZEPAM 2 MG/ML IJ SOLN: 2.0000 mg | Freq: Once | INTRAMUSCULAR | Status: DC

## 2017-02-22 MED ORDER — ACETAMINOPHEN 325 MG PO TABS: 650.0000 mg | ORAL_TABLET | Freq: Once | ORAL | Status: DC

## 2017-02-22 NOTE — ED Notes (Signed)
Patient transported to X-ray 

## 2017-02-22 NOTE — ED Triage Notes (Signed)
Pt comes from his mother's house via EMS with complaints of a fall related to alcohol intoxication. Drank 30 40's today.  Pt has no obvious injuries or deformities.  Pt has chronic neck and bilateral shoulder pain and is complaining of that. Vitals WNL.

## 2017-02-22 NOTE — Discharge Instructions (Signed)
Read the information below.  You may return to the Emergency Department at any time for worsening condition or any new symptoms that concern you. °

## 2017-02-22 NOTE — ED Provider Notes (Signed)
[facility] DEPT Provider Note   CSN: [id number] Arrival date & time: 02/22/17  1641     History   Chief Complaint Chief Complaint  Patient presents with  . Fall  . Alcohol Intoxication    HPI Brian [name] is a 31 y.o. male.  HPI   Pt with hx anxiety, bipolar disorder, PTSD, Hep C p/w neck pain and alcohol intoxication.  Per mother, she found him when she came home from work and found out he had fallen.  He drinks alcohol heavily on a regular basis, drank 3 - 40 oz beers today.  She thinks he fell outside.  She does not have any other details of the fall or his day.  Pt states his neck hurts.  His other remarks are slurred an incomprehensible.  Level V caveat for alcohol intoxication.   Past Medical History:  Diagnosis Date  . Anxiety   . Bipolar 1 disorder ([place])   . Depression   . Hepatitis C   . PTSD (post-traumatic stress disorder)     Patient Active Problem List   Diagnosis Date Noted  . Sprain of left foot 10/01/2016  . Essential hypertension 06/12/2015  . Esophageal reflux   . GERD (gastroesophageal reflux disease) 12/23/2014  . Fracture of ischium ([place]) 12/20/2014  . Alcohol use disorder, severe, dependence ([place]) 12/19/2014  . Schizoaffective disorder, unspecified type ([place]) 01/12/2014    Class: Acute    Past Surgical History:  Procedure Laterality Date  . LEG SURGERY         Home Medications    Prior to Admission medications   Medication Sig Start Date End Date Taking? Authorizing Provider  acetaminophen (TYLENOL) 500 MG tablet Take 1 tablet (500 mg total) by mouth every 6 (six) hours as needed. Patient not taking: Reported on 10/21/2016 06/12/15   Dorena Dew, FNP  benztropine (COGENTIN) 0.5 MG tablet Take 1 tablet (0.5 mg total) by mouth 2 (two) times daily. 12/24/14   Niel Hummer, NP  cloNIDine (CATAPRES) 0.1 MG tablet Take 1 tablet (0.1 mg total) by mouth 2 (two) times daily. 06/16/15   Dorena Dew, FNP  divalproex (DEPAKOTE) 500  MG DR tablet Take 1,000 mg by mouth at bedtime.     [provider]  haloperidol (HALDOL) 5 MG tablet Take 1 tablet (5 mg total) by mouth 2 (two) times daily. 12/24/14   Niel Hummer, NP  HYDROcodone-acetaminophen (NORCO/VICODIN) 5-325 MG tablet Take 1 tablet by mouth every 6 (six) hours as needed for severe pain. Patient not taking: Reported on 06/18/2016 01/14/16   Harvel Quale, MD  hydrOXYzine (ATARAX/VISTARIL) 25 MG tablet Take 25 mg by mouth 3 (three) times daily.    [provider]  ibuprofen (ADVIL,MOTRIN) 200 MG tablet Take 800 mg by mouth every 6 (six) hours as needed for mild pain or moderate pain.    [provider]  ibuprofen (ADVIL,MOTRIN) 800 MG tablet Take 1 tablet (800 mg total) by mouth 3 (three) times daily. Patient not taking: Reported on 10/21/2016 10/03/16   Lorayne Bender, PA-C  lamoTRIgine (LAMICTAL) 25 MG tablet Take 1 tablet (25 mg total) by mouth daily. 12/24/14   Niel Hummer, NP  Multiple Vitamin (MULTIVITAMIN WITH MINERALS) TABS tablet Take 1 tablet by mouth daily. May purchase over the counter to improve general health. Patient not taking: Reported on 10/21/2016 12/24/14   Niel Hummer, NP  naproxen (NAPROSYN) 500 MG tablet Take 1 tablet (500 mg total) by mouth  2 (two) times daily. 06/18/16   Hyman Bible, PA-C  Omega-3 Fatty Acids (FISH OIL) 1000 MG CAPS Take 1 capsule (1,000 mg total) by mouth daily. 12/24/14   Niel Hummer, NP  thiamine 100 MG tablet Take 1 tablet (100 mg total) by mouth daily. Patient not taking: Reported on 06/18/2016 12/24/14   Niel Hummer, NP  traZODone (DESYREL) 150 MG tablet Take 1 tablet (150 mg total) by mouth at bedtime. 12/24/14   Niel Hummer, NP  vitamin E 400 UNIT capsule Take 1 capsule (400 Units total) by mouth daily. Patient not taking: Reported on 06/18/2016 12/24/14   Niel Hummer, NP    Family History No family history on file.  Social History Social History  Substance Use Topics  . Smoking  status: Current Every Day Smoker    Packs/day: 1.00    Types: Cigarettes  . Smokeless tobacco: Never Used  . Alcohol use Yes     Comment: 12-18 packs of beer/ OCCASIONALLY     Allergies   Phenytoin sodium extended; Pineapple; and Risperdal [risperidone]   Review of Systems Review of Systems  Unable to perform ROS: Other     Physical Exam Updated Vital Signs BP 124/61 (BP Location: Right Arm)   Pulse 84   Temp 97.8 F (36.6 C) (Oral)   Resp 12   Ht 6\' 1"  (1.854 m)   Wt 117.9 kg (260 lb)   SpO2 96%   BMI 34.30 kg/m   Physical Exam  Constitutional: He appears well-developed and well-nourished. No distress.  Pt lying flat on stretcher with towel around neck.  Speech is slurred.    HENT:  Head: Normocephalic.  Neck:  Towel wrapped and taped around neck.    Cardiovascular: Normal rate and regular rhythm.   Pulmonary/Chest: Effort normal and breath sounds normal. No respiratory distress. He has no wheezes. He has no rales.  Abdominal: Soft. He exhibits no distension and no mass. There is tenderness (mild, diffuse ). There is no rebound and no guarding.  Musculoskeletal:       Arms: Neurological: He is alert. He exhibits normal muscle tone.  Skin: He is not diaphoretic.  Nursing note and vitals reviewed.    ED Treatments / Results  Labs (all labs ordered are listed, but only abnormal results are displayed) Labs Reviewed  COMPREHENSIVE METABOLIC PANEL - Abnormal; Notable for the following:       Result Value   Sodium 133 (*)    Chloride 100 (*)    Glucose, Bld 106 (*)    BUN <5 (*)    Calcium 8.7 (*)    Total Protein 6.2 (*)    AST 56 (*)    All other components within normal limits  CBC WITH DIFFERENTIAL/PLATELET - Abnormal; Notable for the following:    MCHC 36.5 (*)    All other components within normal limits  RAPID URINE DRUG SCREEN, HOSP PERFORMED - Abnormal; Notable for the following:    Benzodiazepines POSITIVE (*)    Tetrahydrocannabinol POSITIVE  (*)    All other components within normal limits  ETHANOL - Abnormal; Notable for the following:    Alcohol, Ethyl (B) 174 (*)    All other components within normal limits    EKG  EKG Interpretation None       Radiology Dg Chest 2 View  Result Date: 02/22/2017 CLINICAL DATA:  Fall EXAM: CHEST  2 VIEW COMPARISON:  CT 01/14/2016 FINDINGS: Heart and mediastinal contours are within normal  limits. No focal opacities or effusions. No acute bony abnormality. IMPRESSION: No active cardiopulmonary disease. Electronically Signed   By: Rolm Baptise M.D.   On: 02/22/2017 19:33   Dg Thoracic Spine 2 View  Result Date: 02/22/2017 CLINICAL DATA:  Fall. EXAM: THORACIC SPINE 2 VIEWS COMPARISON:  02/22/2017 FINDINGS: There is no evidence of thoracic spine fracture. Alignment is normal. No other significant bone abnormalities are identified. IMPRESSION: Negative. Electronically Signed   By: Rolm Baptise M.D.   On: 02/22/2017 19:32   Ct Head Wo Contrast  Result Date: 02/22/2017 CLINICAL DATA:  31 year old male with fall today with head and neck injury, headache and neck pain. Initial encounter. EXAM: CT HEAD WITHOUT CONTRAST CT CERVICAL SPINE WITHOUT CONTRAST TECHNIQUE: Multidetector CT imaging of the head and cervical spine was performed following the standard protocol without intravenous contrast. Multiplanar CT image reconstructions of the cervical spine were also generated. COMPARISON:  06/25/2012 CTs FINDINGS: CT HEAD FINDINGS Brain: No evidence of acute infarction, hemorrhage, hydrocephalus, extra-axial collection or mass lesion/mass effect. Mild generalized cerebral volume loss noted. Vascular: No hyperdense vessel or unexpected calcification. Skull: Normal. Negative for fracture or focal lesion. Sinuses/Orbits: No acute finding. Other: None. CT CERVICAL SPINE FINDINGS Alignment: Straightening of the normal cervical lordosis is unchanged. Alignment normal without evidence of subluxation. Skull base and  vertebrae: No acute fracture. No primary bone lesion or focal pathologic process. Soft tissues and spinal canal: No prevertebral fluid or swelling. No visible canal hematoma. Disc levels:  Unremarkable Upper chest: Negative. Other: None IMPRESSION: No evidence of acute intracranial abnormality. No static evidence of acute injury to the cervical spine. Electronically Signed   By: Margarette Canada M.D.   On: 02/22/2017 18:44   Ct Cervical Spine Wo Contrast  Result Date: 02/22/2017 CLINICAL DATA:  32 year old male with fall today with head and neck injury, headache and neck pain. Initial encounter. EXAM: CT HEAD WITHOUT CONTRAST CT CERVICAL SPINE WITHOUT CONTRAST TECHNIQUE: Multidetector CT imaging of the head and cervical spine was performed following the standard protocol without intravenous contrast. Multiplanar CT image reconstructions of the cervical spine were also generated. COMPARISON:  06/25/2012 CTs FINDINGS: CT HEAD FINDINGS Brain: No evidence of acute infarction, hemorrhage, hydrocephalus, extra-axial collection or mass lesion/mass effect. Mild generalized cerebral volume loss noted. Vascular: No hyperdense vessel or unexpected calcification. Skull: Normal. Negative for fracture or focal lesion. Sinuses/Orbits: No acute finding. Other: None. CT CERVICAL SPINE FINDINGS Alignment: Straightening of the normal cervical lordosis is unchanged. Alignment normal without evidence of subluxation. Skull base and vertebrae: No acute fracture. No primary bone lesion or focal pathologic process. Soft tissues and spinal canal: No prevertebral fluid or swelling. No visible canal hematoma. Disc levels:  Unremarkable Upper chest: Negative. Other: None IMPRESSION: No evidence of acute intracranial abnormality. No static evidence of acute injury to the cervical spine. Electronically Signed   By: Margarette Canada M.D.   On: 02/22/2017 18:44    Procedures Procedures (including critical care time)  Medications Ordered in  ED Medications  acetaminophen (TYLENOL) tablet 650 mg (650 mg Oral Refused 02/22/17 2059)     Initial Impression / Assessment and Plan / ED Course  I have reviewed the triage vital signs and the nursing notes.  Pertinent labs & imaging results that were available during my care of the patient were reviewed by me and considered in my medical decision making (see chart for details).  Clinical Course as of Feb 22 2158  Tue Feb 22, 2017  2016 Pt  ambulatory to the bathroom.  He is able to speak fairly clearly, asks for medication to "loosen (my) muscles"  Pain is localized to the upper back. Not cervical neck pain.  Mother remains bedside, she agrees with discharge home at this time.  [EW]    Clinical Course User Index [EW] Clayton Bibles, [place]    Intoxicated pt with report of fall today.  C/O neck pain.  CT head, c-spine negative.   Labs significant for alcohol intoxication, mild dehydration.  Pt's tenderness is actually in the upper thoracic back, not neck.  Doubt significant injury.  Pt sobered well without withdrawal.  Able to eat and drink in ED.  Ambulated.  Mother to take home.  Pt given resources for alcohol/drug abuse and for primary care on discharge.  Discussed result, findings, treatment, and follow up  with patient.  Pt given return precautions.  Pt verbalizes understanding and agrees with plan.      Final Clinical Impressions(s) / ED Diagnoses   Final diagnoses:  Alcoholic intoxication without complication ([place])  Fall, initial encounter  Upper back pain    New Prescriptions Discharge Medication List as of 02/22/2017  8:19 PM       Clayton Bibles, Hershal Coria 02/22/17 2206    Lacretia Leigh, MD 02/23/17 2332

## 2017-02-22 NOTE — ED Notes (Signed)
Bed: [facility] Expected date:  Expected time:  Means of arrival:  Comments: EMS-fall r/t ETOH

## 2017-02-22 NOTE — ED Notes (Signed)
Patient ambulatory to restroom with steady gait and NAD.  

## 2017-08-31 ENCOUNTER — Emergency Department (HOSPITAL_COMMUNITY): Payer: Self-pay

## 2017-08-31 ENCOUNTER — Encounter (HOSPITAL_COMMUNITY): Payer: Self-pay | Admitting: Emergency Medicine

## 2017-08-31 ENCOUNTER — Emergency Department (HOSPITAL_COMMUNITY)
Admission: EM | Admit: 2017-08-31 | Discharge: 2017-09-01 | Disposition: A | Discharge status: Home / Self Care | Payer: Self-pay | Attending: Emergency Medicine | Admitting: Emergency Medicine

## 2017-08-31 DIAGNOSIS — F319 Bipolar disorder, unspecified: Secondary | ICD-10-CM | POA: Insufficient documentation

## 2017-08-31 DIAGNOSIS — F17210 Nicotine dependence, cigarettes, uncomplicated: Secondary | ICD-10-CM | POA: Insufficient documentation

## 2017-08-31 DIAGNOSIS — Z79899 Other long term (current) drug therapy: Secondary | ICD-10-CM | POA: Insufficient documentation

## 2017-08-31 DIAGNOSIS — R4182 Altered mental status, unspecified: Secondary | ICD-10-CM

## 2017-08-31 DIAGNOSIS — R270 Ataxia, unspecified: Secondary | ICD-10-CM

## 2017-08-31 DIAGNOSIS — F259 Schizoaffective disorder, unspecified: Secondary | ICD-10-CM | POA: Insufficient documentation

## 2017-08-31 DIAGNOSIS — I10 Essential (primary) hypertension: Secondary | ICD-10-CM | POA: Insufficient documentation

## 2017-08-31 DIAGNOSIS — F419 Anxiety disorder, unspecified: Secondary | ICD-10-CM | POA: Insufficient documentation

## 2017-08-31 LAB — CBC
HEMATOCRIT: 45.5 % (ref 39.0–52.0)
HEMOGLOBIN: 16.1 g/dL (ref 13.0–17.0)
MCH: 31.5 pg (ref 26.0–34.0)
MCHC: 35.4 g/dL (ref 30.0–36.0)
MCV: 89 fL (ref 78.0–100.0)
Platelets: 225 10*3/uL (ref 150–400)
RBC: 5.11 MIL/uL (ref 4.22–5.81)
RDW: 11.9 % (ref 11.5–15.5)
WBC: 6.2 10*3/uL (ref 4.0–10.5)

## 2017-08-31 LAB — COMPREHENSIVE METABOLIC PANEL
ALT: 14 U/L — ABNORMAL LOW (ref 17–63)
ANION GAP: 7 (ref 5–15)
AST: 22 U/L (ref 15–41)
Albumin: 4.5 g/dL (ref 3.5–5.0)
Alkaline Phosphatase: 54 U/L (ref 38–126)
BUN: 7 mg/dL (ref 6–20)
CHLORIDE: 100 mmol/L — AB (ref 101–111)
CO2: 25 mmol/L (ref 22–32)
Calcium: 9.7 mg/dL (ref 8.9–10.3)
Creatinine, Ser: 0.97 mg/dL (ref 0.61–1.24)
Glucose, Bld: 101 mg/dL — ABNORMAL HIGH (ref 65–99)
POTASSIUM: 4.1 mmol/L (ref 3.5–5.1)
Sodium: 132 mmol/L — ABNORMAL LOW (ref 135–145)
Total Bilirubin: 1.1 mg/dL (ref 0.3–1.2)
Total Protein: 7.4 g/dL (ref 6.5–8.1)

## 2017-08-31 LAB — AMMONIA: AMMONIA: 24 umol/L (ref 9–35)

## 2017-08-31 LAB — URINALYSIS, ROUTINE W REFLEX MICROSCOPIC
Bilirubin Urine: NEGATIVE
GLUCOSE, UA: NEGATIVE mg/dL
Hgb urine dipstick: NEGATIVE
Ketones, ur: 5 mg/dL — AB
LEUKOCYTES UA: NEGATIVE
NITRITE: NEGATIVE
PH: 6 (ref 5.0–8.0)
Protein, ur: NEGATIVE mg/dL
SPECIFIC GRAVITY, URINE: 1.014 (ref 1.005–1.030)

## 2017-08-31 LAB — LIPASE, BLOOD: LIPASE: 26 U/L (ref 11–51)

## 2017-08-31 LAB — ETHANOL: Alcohol, Ethyl (B): <10 mg/dL (ref <10)

## 2017-08-31 MED ORDER — NICOTINE 21 MG/24HR TD PT24
21.0000 mg | MEDICATED_PATCH | Freq: Once | TRANSDERMAL | Status: DC
  Administered 2017-08-31: 21 mg via TRANSDERMAL
  Filled 2017-08-31: qty 1

## 2017-08-31 MED ORDER — SODIUM CHLORIDE 0.9 % IV BOLUS (SEPSIS)
1000.0000 mL | Freq: Once | INTRAVENOUS | Status: AC
  Administered 2017-08-31: 1000 mL via INTRAVENOUS

## 2017-08-31 NOTE — ED Provider Notes (Signed)
[facility] DEPT Provider Note   CSN: [id number] Arrival date & time: 08/31/17  1416     History   Chief Complaint Chief Complaint  Patient presents with  . Abdominal Pain    HPI Brian [name] is a 32 y.o. male history of anxiety, bipolar 1 disorder, depression, PTSD, hepatitis C, and alcohol use disorder who presents to the emergency department with his mother with a chief complaint of altered mental status.  The patient's mother reports that the patient has had increased irritable mood, erratic behavior, and more somnolent over the last 3 days.  The patient's mother reports that she administers all of his medications, including benztropine, clonidine, divalproex, trazodone, doxazosin, Lamictal, and Haldol.  No recent medication changes in 3 months.  She reports that the only medication he has access to throughout the day is hydroxyzine.  She is concerned that he may have taken a few extra doses over the last few days.  The patient denies alcohol use, IV drug use, or recreational drug use over the last 3 days.    He also complains of a right-sided temporal HA that began yesterday with dizziness. No visual changes, tinnitus, or lightheadedness. He also endorses mild intermittent nausea and intermittent generalized abdominal pain. No emesis, diarrhea, constipation, hematochezia, melena, urinary symptoms, fever, or chills.  No other treatment prior to arrival.  The history is provided by the patient. No language interpreter was used.    Past Medical History:  Diagnosis Date  . Anxiety   . Bipolar 1 disorder ([place])   . Depression   . Hepatitis C   . PTSD (post-traumatic stress disorder)     Patient Active Problem List   Diagnosis Date Noted  . Sprain of left foot 10/01/2016  . Essential hypertension 06/12/2015  . Esophageal reflux   . GERD (gastroesophageal reflux disease) 12/23/2014  . Fracture of ischium ([place]) 12/20/2014  . Alcohol use disorder,  severe, dependence ([place]) 12/19/2014  . Schizoaffective disorder, unspecified type ([place]) 01/12/2014    Class: Acute    Past Surgical History:  Procedure Laterality Date  . LEG SURGERY         Home Medications    Prior to Admission medications   Medication Sig Start Date End Date Taking? Authorizing Provider  benztropine (COGENTIN) 0.5 MG tablet Take 1 tablet (0.5 mg total) by mouth 2 (two) times daily. 12/24/14  Yes Niel Hummer, NP  cloNIDine (CATAPRES) 0.1 MG tablet Take 1 tablet (0.1 mg total) by mouth 2 (two) times daily. 06/16/15  Yes Dorena Dew, FNP  divalproex (DEPAKOTE) 500 MG DR tablet Take 1,000 mg by mouth at bedtime.    Yes [provider]  haloperidol (HALDOL) 5 MG tablet Take 1 tablet (5 mg total) by mouth 2 (two) times daily. 12/24/14  Yes Niel Hummer, NP  hydrOXYzine (ATARAX/VISTARIL) 25 MG tablet Take 25 mg by mouth 3 (three) times daily.   Yes [provider]  ibuprofen (ADVIL,MOTRIN) 200 MG tablet Take 800 mg by mouth every 6 (six) hours as needed for mild pain or moderate pain.   Yes [provider]  lamoTRIgine (LAMICTAL) 25 MG tablet Take 1 tablet (25 mg total) by mouth daily. 12/24/14  Yes Niel Hummer, NP  traZODone (DESYREL) 150 MG tablet Take 1 tablet (150 mg total) by mouth at bedtime. 12/24/14  Yes Niel Hummer, NP  ibuprofen (ADVIL,MOTRIN) 800 MG tablet Take 1 tablet (800 mg total) by mouth 3 (three) times daily.  Patient not taking: Reported on 10/21/2016 10/03/16   Lorayne Bender, PA-C    Family History No family history on file.  Social History Social History   Tobacco Use  . Smoking status: Current Every Day Smoker    Packs/day: 1.00    Types: Cigarettes  . Smokeless tobacco: Never Used  Substance Use Topics  . Alcohol use: Yes    Comment: 12-18 packs of beer/ OCCASIONALLY  . Drug use: Yes    Types: Methamphetamines, Heroin, Marijuana, IV    Comment: "huffs" air canisters      Allergies   Phenytoin sodium  extended; Pineapple; and Risperdal [risperidone]   Review of Systems Review of Systems  Constitutional: Negative for chills and fever.  Eyes: Negative for visual disturbance.  Respiratory: Negative for shortness of breath.   Cardiovascular: Negative for chest pain.  Gastrointestinal: Positive for abdominal pain and nausea. Negative for vomiting.  Genitourinary: Negative for dysuria, penile pain, penile swelling, scrotal swelling and testicular pain.  Musculoskeletal: Negative for neck pain and neck stiffness.  Skin: Negative for rash.  Neurological: Positive for light-headedness and headaches. Negative for seizures and weakness.  Psychiatric/Behavioral: Positive for confusion and sleep disturbance.     Physical Exam Updated Vital Signs BP 117/69   Pulse (!) 52   Temp 97.8 F (36.6 C) (Oral)   Resp 15   SpO2 99%   Physical Exam  Constitutional: He is oriented to person, place, and time. He appears well-developed.  HENT:  Head: Normocephalic.  Eyes: Conjunctivae and EOM are normal. Pupils are equal, round, and reactive to light. No scleral icterus.  Neck: Normal range of motion. Neck supple.  Cardiovascular: Normal rate and regular rhythm.  No murmur heard. Pulmonary/Chest: Effort normal and breath sounds normal. No stridor. No respiratory distress. He has no wheezes. He has no rales. He exhibits no tenderness.  Abdominal: Soft. He exhibits no distension.  Neurological: He is alert and oriented to person, place, and time.  GCS 15. CN II-XII are grossly intact. Ataxic gait. Patient almost falls with Romberg. Slow to follow simple commands. He appears easily confused. Speech is slowed.  5 out of 5 strength of the bilateral upper and lower extremities.  Sensation is intact throughout.  No pronator drift.   Skin: Skin is warm and dry. Capillary refill takes less than 2 seconds.  Psychiatric: His behavior is normal.  Nursing note and vitals reviewed.  ED Treatments / Results    Labs (all labs ordered are listed, but only abnormal results are displayed) Labs Reviewed  COMPREHENSIVE METABOLIC PANEL - Abnormal; Notable for the following components:      Result Value   Sodium 132 (*)    Chloride 100 (*)    Glucose, Bld 101 (*)    ALT 14 (*)    All other components within normal limits  URINALYSIS, ROUTINE W REFLEX MICROSCOPIC - Abnormal; Notable for the following components:   Ketones, ur 5 (*)    All other components within normal limits  LIPASE, BLOOD  CBC  AMMONIA  ETHANOL  RAPID URINE DRUG SCREEN, HOSP PERFORMED  VALPROIC ACID LEVEL    EKG  EKG Interpretation None       Radiology Ct Head Wo Contrast  Result Date: 08/31/2017 CLINICAL DATA:  Altered mental status EXAM: CT HEAD WITHOUT CONTRAST TECHNIQUE: Contiguous axial images were obtained from the base of the skull through the vertex without intravenous contrast. COMPARISON:  His head CT 02/22/2017 FINDINGS: Brain: No mass lesion, intraparenchymal hemorrhage  or extra-axial collection. No evidence of acute cortical infarct. Brain parenchyma and CSF-containing spaces are normal for age. Vascular: No hyperdense vessel or unexpected calcification. Skull: Normal visualized skull base, calvarium and extracranial soft tissues. Sinuses/Orbits: No sinus fluid levels or advanced mucosal thickening. No mastoid effusion. Normal orbits. IMPRESSION: Normal head CT. Electronically Signed   By: Ulyses Jarred M.D.   On: 08/31/2017 22:28    Procedures Procedures (including critical care time)  Medications Ordered in ED Medications  nicotine (NICODERM CQ - dosed in mg/24 hours) patch 21 mg (21 mg Transdermal Patch Applied 08/31/17 2248)  sodium chloride 0.9 % bolus 1,000 mL (not administered)  sodium chloride 0.9 % bolus 1,000 mL (1,000 mLs Intravenous New Bag/Given 08/31/17 2238)     Initial Impression / Assessment and Plan / ED Course  I have reviewed the triage vital signs and the nursing notes.  Pertinent  labs & imaging results that were available during my care of the patient were reviewed by me and considered in my medical decision making (see chart for details).     32 year old male with a history of alcohol use disorder, anxiety, PTSD, bipolar 1 disorder, and hepatitis C presenting with altered mental status.  On physical exam, the patient has ataxic gait, mentation appears slowed, and the patient has intermittent difficulty following simple commands.  No focal neurologic deficits.  He is bradycardic, consistent with EKG. CT head is unremarkable. Mild hyponatremia of 132.  UA with minimal ketones.  Will add ethanol level, UDS, valproic acid. IVF given. Suspect symptoms are secondary to substance use. Patient care transferred to PA [place] at the end of my shift.  Plan to follow-up with pending labs, reexamine the patient and fluid challenge. If ataxia and mental status has improved, will plan to discharge the patient to home with follow up to [facility].  If the patient still appears ataxic, may need to be admitted for observation.  Patient presentation, ED course, and plan of care discussed with review of all pertinent labs and imaging. Please see his/her note for further details regarding further ED course and disposition.   Final Clinical Impressions(s) / ED Diagnoses   Final diagnoses:  Altered mental status, unspecified altered mental status type    ED Discharge Orders    None       Joanne Gavel, PA-C 09/01/17 0022    [place], Fredia Sorrow, MD 09/07/17 1535

## 2017-08-31 NOTE — ED Provider Notes (Signed)
Here with c/o AMS x 3 days Here with mom whom he lives with Hard to arouse, slurring speech, unsteady She manages home meds but he has access for possible additional dose  Exam: poor command following, ataxia Head CT neg Ammonia, UDS pending IVF's running Bradycardia to 40's here w/o history Smells of ETOH No recent meds changes from [facility] - f/u next week  Plan: hydrate, re-eval, review pending labs  Labs are essentially unremarkable. On re-check the patient is improved, alert, oriented and ambulatory with steady gait and coordination. UDS negative, however, pattern and symptoms suggest he ingested something to cause symptoms. He is considered stable for discharge home.    Charlann Lange, PA-C 09/01/17 Daryel Gerald, MD 09/02/17 [phone]

## 2017-08-31 NOTE — ED Triage Notes (Signed)
Patient here from home brought in by mother with complaints of weakness and dizziness. Denies alcohol and drugs x36 hours. Abdominal pain , nausea.

## 2017-09-01 LAB — RAPID URINE DRUG SCREEN, HOSP PERFORMED
AMPHETAMINES: NOT DETECTED
BARBITURATES: NOT DETECTED
BENZODIAZEPINES: NOT DETECTED
COCAINE: NOT DETECTED
Opiates: NOT DETECTED
Tetrahydrocannabinol: NOT DETECTED

## 2017-09-01 LAB — VALPROIC ACID LEVEL: Valproic Acid Lvl: 81 ug/mL (ref 50.0–100.0)

## 2017-09-01 MED ORDER — SODIUM CHLORIDE 0.9 % IV BOLUS (SEPSIS)
1000.0000 mL | Freq: Once | INTRAVENOUS | Status: AC
  Administered 2017-09-01: 1000 mL via INTRAVENOUS

## 2017-09-01 NOTE — Discharge Instructions (Signed)
Your lab studies are essentially normal and do not find a cause for your symptoms of altered mental status and difficulty walking, both of which have improved significantly while here. Follow up with your doctor as needed and return here with any worsening symptoms.

## 2018-02-17 ENCOUNTER — Other Ambulatory Visit: Payer: Self-pay

## 2018-02-17 ENCOUNTER — Encounter: Payer: Self-pay | Admitting: Nurse Practitioner

## 2018-02-17 ENCOUNTER — Ambulatory Visit: Payer: Self-pay | Attending: Nurse Practitioner | Admitting: Nurse Practitioner

## 2018-02-17 VITALS — BP 139/90 | HR 81 | Temp 98.1°F | Ht 72.0 in | Wt 226.0 lb

## 2018-02-17 DIAGNOSIS — F319 Bipolar disorder, unspecified: Secondary | ICD-10-CM | POA: Insufficient documentation

## 2018-02-17 DIAGNOSIS — R768 Other specified abnormal immunological findings in serum: Secondary | ICD-10-CM

## 2018-02-17 DIAGNOSIS — F4310 Post-traumatic stress disorder, unspecified: Secondary | ICD-10-CM | POA: Insufficient documentation

## 2018-02-17 DIAGNOSIS — Z8249 Family history of ischemic heart disease and other diseases of the circulatory system: Secondary | ICD-10-CM | POA: Insufficient documentation

## 2018-02-17 DIAGNOSIS — Z7689 Persons encountering health services in other specified circumstances: Secondary | ICD-10-CM | POA: Insufficient documentation

## 2018-02-17 DIAGNOSIS — I10 Essential (primary) hypertension: Secondary | ICD-10-CM | POA: Insufficient documentation

## 2018-02-17 DIAGNOSIS — Z79899 Other long term (current) drug therapy: Secondary | ICD-10-CM | POA: Insufficient documentation

## 2018-02-17 DIAGNOSIS — E785 Hyperlipidemia, unspecified: Secondary | ICD-10-CM | POA: Insufficient documentation

## 2018-02-17 DIAGNOSIS — F259 Schizoaffective disorder, unspecified: Secondary | ICD-10-CM | POA: Insufficient documentation

## 2018-02-17 DIAGNOSIS — Z888 Allergy status to other drugs, medicaments and biological substances status: Secondary | ICD-10-CM | POA: Insufficient documentation

## 2018-02-17 MED ORDER — AMLODIPINE BESYLATE 5 MG PO TABS: 5.0000 mg | ORAL_TABLET | Freq: Every day | ORAL | 3 refills | Status: DC

## 2018-02-17 NOTE — Progress Notes (Signed)
Assessment & Plan:  Brian [name] was seen today for establish care.  Diagnoses and all orders for this visit:  Essential hypertension -     amLODipine (NORVASC) 5 MG tablet; Take 1 tablet (5 mg total) by mouth daily. Continue all antihypertensives as prescribed.  Remember to bring in your blood pressure log with you for your follow up appointment.  DASH/Mediterranean Diets are healthier choices for HTN.    Schizoaffective disorder, unspecified type ([place]) Continue follow up with psychiatry. Do not skip or stop taking medication unless instructed by psychiatry.   Hepatitis C antibody positive in blood -     HCV RNA quant Patient reports history of hep c antibody and negative Hep C RNA however the last hep C RNA in patient's chart was canceled prior to completion. Will obtain today.    Patient has been counseled on age-appropriate routine health concerns for screening and prevention. These are reviewed and up-to-date. Referrals have been placed accordingly. Immunizations are up-to-date or declined.    Subjective:   Chief Complaint  Patient presents with  . Establish Care    Pt. is here to establish care for hypertension.    HPI Brian [name] 32 y.o. male presents to office today to establish care for his hypertension. He is accompanied by his mother today. He has a history of HTN, Anxiety, Bipolar /Schizoaffective Disorder, Depression, and PTSD. Currently seeing a psychiatrist every 3 months through [facility]. He does endorse increased anxiety today however I have instructed him to discuss this with his psychiatrist at his upcoming appointment.    Essential Hypertension Chronic. Endorses medication compliance taking amlodipine 5mg  daily. Denies chest pain, shortness of breath, palpitations, lightheadedness, dizziness, headaches or BLE edema. He is not diet or exercise compliant.  BP Readings from Last 3 Encounters:  02/17/18 (!) 137/97  09/01/17 112/64  02/22/17 124/61     Hyperlipidemia Chronic. Will check lipids today. May need to start statin. He denies any statin intolerance.  Lab Results  Component Value Date   CHOL 211 (H) 12/20/2014   Lab Results  Component Value Date   HDL 31 (L) 12/20/2014   Lab Results  Component Value Date   LDLCALC 151 (H) 12/20/2014   Lab Results  Component Value Date   TRIG 146 12/20/2014   Lab Results  Component Value Date   CHOLHDL 6.8 12/20/2014    Review of Systems  Constitutional: Negative for fever, malaise/fatigue and weight loss.  HENT: Negative.  Negative for nosebleeds.   Eyes: Negative.  Negative for blurred vision, double vision and photophobia.  Respiratory: Positive for cough. Negative for shortness of breath.   Cardiovascular: Negative.  Negative for chest pain, palpitations and leg swelling.  Gastrointestinal: Negative.  Negative for heartburn, nausea and vomiting.  Musculoskeletal: Negative.  Negative for myalgias.  Neurological: Negative.  Negative for dizziness, focal weakness, seizures and headaches.  Psychiatric/Behavioral: Positive for depression. Negative for suicidal ideas. The patient is nervous/anxious and has insomnia.     Past Medical History:  Diagnosis Date  . Anxiety   . Bipolar 1 disorder ([place])   . Depression   . Hepatitis C   . Hypertension   . PTSD (post-traumatic stress disorder)     Past Surgical History:  Procedure Laterality Date  . HIP SURGERY      Family History  Problem Relation Age of Onset  . Hypertension Father   . Stroke Maternal Grandmother   . Diabetes Paternal Grandmother     Social History Reviewed with no  changes to be made today.   Outpatient Medications Prior to Visit  Medication Sig Dispense Refill  . benztropine (COGENTIN) 0.5 MG tablet Take 1 tablet (0.5 mg total) by mouth 2 (two) times daily. 60 tablet 0  . divalproex (DEPAKOTE) 500 MG DR tablet Take 1,000 mg by mouth at bedtime.     . haloperidol (HALDOL) 5 MG tablet Take 1 tablet  (5 mg total) by mouth 2 (two) times daily. 60 tablet 0  . QUEtiapine (SEROQUEL) 50 MG tablet Take 50 mg by mouth 2 (two) times daily.    Marland Kitchen lamoTRIgine (LAMICTAL) 25 MG tablet Take 1 tablet (25 mg total) by mouth daily. 30 tablet 0  . traZODone (DESYREL) 150 MG tablet Take 1 tablet (150 mg total) by mouth at bedtime. 30 tablet 0  . ibuprofen (ADVIL,MOTRIN) 200 MG tablet Take 800 mg by mouth every 6 (six) hours as needed for mild pain or moderate pain.    Marland Kitchen ibuprofen (ADVIL,MOTRIN) 800 MG tablet Take 1 tablet (800 mg total) by mouth 3 (three) times daily. (Patient not taking: Reported on 10/21/2016) 21 tablet 0  . cloNIDine (CATAPRES) 0.1 MG tablet Take 1 tablet (0.1 mg total) by mouth 2 (two) times daily. (Patient not taking: Reported on 02/17/2018) 60 tablet 2  . hydrOXYzine (ATARAX/VISTARIL) 25 MG tablet Take 25 mg by mouth 3 (three) times daily.     No facility-administered medications prior to visit.     Allergies  Allergen Reactions  . Phenytoin Sodium Extended Other (See Comments)    Gets sick to his stomach, and closes throat up  . Pineapple Anaphylaxis  . Risperdal [Risperidone] Shortness Of Breath    'throat closes up'       Objective:    BP (!) 137/97 (BP Location: Right Arm, Patient Position: Sitting, Cuff Size: Normal)   Pulse 81   Temp 98.1 F (36.7 C) (Oral)   Ht 6' (1.829 m)   Wt 226 lb (102.5 kg)   SpO2 96%   BMI 30.65 kg/m  Wt Readings from Last 3 Encounters:  02/17/18 226 lb (102.5 kg)  02/22/17 260 lb (117.9 kg)  10/03/16 264 lb (119.7 kg)    Physical Exam  Constitutional: He is oriented to person, place, and time. He appears well-developed and well-nourished. He is cooperative.  HENT:  Head: Normocephalic and atraumatic.  Eyes: EOM are normal.  Neck: Normal range of motion.  Cardiovascular: Normal rate, regular rhythm, normal heart sounds and intact distal pulses. Exam reveals no gallop and no friction rub.  No murmur heard. Pulmonary/Chest: Effort  normal. No tachypnea. No respiratory distress. He has no decreased breath sounds. He has no wheezes. He has no rhonchi. He has no rales. He exhibits no tenderness.  Scattered rhonchi  Abdominal: Soft. Bowel sounds are normal.  Musculoskeletal: Normal range of motion. He exhibits no edema.  Neurological: He is alert and oriented to person, place, and time. Coordination normal.  Skin: Skin is warm and dry.     Psychiatric: He has a normal mood and affect. His behavior is normal. Judgment and thought content normal.  Nursing note and vitals reviewed.      Patient has been counseled extensively about nutrition and exercise as well as the importance of adherence with medications and regular follow-up. The patient was given clear instructions to go to ER or return to medical center if symptoms don't improve, worsen or new problems develop. The patient verbalized understanding.   Follow-up: Return in about 3 months (around  05/20/2018) for physical and HTN.   Gildardo Pounds, FNP-BC [facility] and [place], [place]   02/17/2018, 4:27 PM

## 2018-02-19 LAB — HCV RNA QUANT: HEPATITIS C QUANTITATION: NOT DETECTED [IU]/mL

## 2018-02-24 ENCOUNTER — Telehealth: Payer: Self-pay

## 2018-02-24 NOTE — Telephone Encounter (Signed)
-----   Message from Gildardo Pounds, NP sent at 02/19/2018  1:32 PM EDT ----- Hepatitis C virus not detected.

## 2018-02-24 NOTE — Telephone Encounter (Signed)
CMA attempt to call patient to inform on lab results. No answer and left a VM for patient to call back.  If patient call back, please inform:  Hepatitis C virus not detected.

## 2018-03-12 ENCOUNTER — Other Ambulatory Visit: Payer: Self-pay

## 2018-03-12 ENCOUNTER — Emergency Department (HOSPITAL_COMMUNITY)
Admission: EM | Admit: 2018-03-12 | Discharge: 2018-03-13 | Disposition: A | Discharge status: Home / Self Care | Payer: Self-pay | Attending: Emergency Medicine | Admitting: Emergency Medicine

## 2018-03-12 DIAGNOSIS — F250 Schizoaffective disorder, bipolar type: Secondary | ICD-10-CM | POA: Diagnosis present

## 2018-03-12 DIAGNOSIS — I10 Essential (primary) hypertension: Secondary | ICD-10-CM | POA: Insufficient documentation

## 2018-03-12 DIAGNOSIS — F1810 Inhalant abuse, uncomplicated: Secondary | ICD-10-CM

## 2018-03-12 DIAGNOSIS — F419 Anxiety disorder, unspecified: Secondary | ICD-10-CM | POA: Insufficient documentation

## 2018-03-12 DIAGNOSIS — F1820 Inhalant dependence, uncomplicated: Secondary | ICD-10-CM | POA: Insufficient documentation

## 2018-03-12 DIAGNOSIS — F331 Major depressive disorder, recurrent, moderate: Secondary | ICD-10-CM | POA: Insufficient documentation

## 2018-03-12 DIAGNOSIS — R440 Auditory hallucinations: Secondary | ICD-10-CM | POA: Insufficient documentation

## 2018-03-12 DIAGNOSIS — F4310 Post-traumatic stress disorder, unspecified: Secondary | ICD-10-CM | POA: Insufficient documentation

## 2018-03-12 DIAGNOSIS — F17210 Nicotine dependence, cigarettes, uncomplicated: Secondary | ICD-10-CM | POA: Insufficient documentation

## 2018-03-12 DIAGNOSIS — R634 Abnormal weight loss: Secondary | ICD-10-CM | POA: Insufficient documentation

## 2018-03-12 LAB — CBC
HCT: 44.2 % (ref 39.0–52.0)
Hemoglobin: 15.9 g/dL (ref 13.0–17.0)
MCH: 32.9 pg (ref 26.0–34.0)
MCHC: 36 g/dL (ref 30.0–36.0)
MCV: 91.3 fL (ref 78.0–100.0)
PLATELETS: 259 10*3/uL (ref 150–400)
RBC: 4.84 MIL/uL (ref 4.22–5.81)
RDW: 11.9 % (ref 11.5–15.5)
WBC: 5.7 10*3/uL (ref 4.0–10.5)

## 2018-03-12 LAB — COMPREHENSIVE METABOLIC PANEL
ALK PHOS: 60 U/L (ref 38–126)
ALT: 12 U/L (ref 0–44)
AST: 17 U/L (ref 15–41)
Albumin: 3.5 g/dL (ref 3.5–5.0)
Anion gap: 10 (ref 5–15)
BILIRUBIN TOTAL: 0.9 mg/dL (ref 0.3–1.2)
CALCIUM: 9 mg/dL (ref 8.9–10.3)
CHLORIDE: 98 mmol/L (ref 98–111)
CO2: 22 mmol/L (ref 22–32)
Creatinine, Ser: 0.75 mg/dL (ref 0.61–1.24)
GFR calc non Af Amer: >60 mL/min (ref >60)
Glucose, Bld: 110 mg/dL — ABNORMAL HIGH (ref 70–99)
Potassium: 4.1 mmol/L (ref 3.5–5.1)
Sodium: 130 mmol/L — ABNORMAL LOW (ref 135–145)
TOTAL PROTEIN: 6.5 g/dL (ref 6.5–8.1)

## 2018-03-12 LAB — RAPID URINE DRUG SCREEN, HOSP PERFORMED
AMPHETAMINES: NOT DETECTED
Barbiturates: NOT DETECTED
Benzodiazepines: NOT DETECTED
Cocaine: NOT DETECTED
OPIATES: NOT DETECTED
Tetrahydrocannabinol: NOT DETECTED

## 2018-03-12 LAB — ACETAMINOPHEN LEVEL: Acetaminophen (Tylenol), Serum: <10 ug/mL — ABNORMAL LOW (ref 10–30)

## 2018-03-12 LAB — SALICYLATE LEVEL

## 2018-03-12 LAB — ETHANOL: Alcohol, Ethyl (B): <10 mg/dL (ref <10)

## 2018-03-12 MED ORDER — TRAZODONE HCL 100 MG PO TABS
100.0000 mg | ORAL_TABLET | Freq: Every day | ORAL | Status: DC
  Administered 2018-03-12: 100 mg via ORAL
  Filled 2018-03-12: qty 1

## 2018-03-12 MED ORDER — QUETIAPINE FUMARATE 25 MG PO TABS
25.0000 mg | ORAL_TABLET | Freq: Every day | ORAL | Status: DC
  Administered 2018-03-12: 25 mg via ORAL
  Filled 2018-03-12: qty 1

## 2018-03-12 MED ORDER — DIVALPROEX SODIUM 500 MG PO DR TAB
1000.0000 mg | DELAYED_RELEASE_TABLET | Freq: Every day | ORAL | Status: DC
  Administered 2018-03-12: 1000 mg via ORAL
  Filled 2018-03-12: qty 2

## 2018-03-12 MED ORDER — HYDROXYZINE HCL 25 MG PO TABS: 25.0000 mg | ORAL_TABLET | Freq: Three times a day (TID) | ORAL | Status: DC | PRN

## 2018-03-12 NOTE — ED Provider Notes (Signed)
[place] DEPT Provider Note   CSN: [id number] Arrival date & time: 03/12/18  1247     History   Chief Complaint Chief Complaint  Patient presents with  . Suicidal  . Hallucinations    HPI Brian [name] is a 32 y.o. male.  32 year old male with prior history as below presents with complaint of auditory hallucinations.  Patient reports increasing auditory hallucinations over the last 1 to 2 weeks.  Patient reports that he hears a male voice.  He denies specific command instructions.  He denies active suicidal ideation.  He reports that today he was huffing compressed air in order to "get some sleep." When his mother discovered that he was Huffing she brought him to the ED for evaluation.  Patient reports that he has used huffing multiple times in the past.  Patient reports that he is followed by [name] for his psychiatric conditions.  Patient reports compliance with medications.  He is unable to tell me exactly what medicines he is taking.  The history is provided by the patient and medical records.  Mental Health Problem  Presenting symptoms: hallucinations   Degree of incapacity (severity):  Moderate Onset quality:  Unable to specify Duration:  2 weeks Timing:  Intermittent Progression:  Waxing and waning Chronicity:  Recurrent Context: drug abuse   Treatment compliance:  All of the time Relieved by:  Nothing   Past Medical History:  Diagnosis Date  . Anxiety   . Bipolar 1 disorder ([facility])   . Depression   . Hepatitis C   . Hypertension   . PTSD (post-traumatic stress disorder)     Patient Active Problem List   Diagnosis Date Noted  . Sprain of left foot 10/01/2016  . Essential hypertension 06/12/2015  . Esophageal reflux   . GERD (gastroesophageal reflux disease) 12/23/2014  . Fracture of ischium ([place]) 12/20/2014  . Alcohol use disorder, severe, dependence ([place]) 12/19/2014  . Schizoaffective disorder, unspecified type ([place])  01/12/2014    Class: Acute    Past Surgical History:  Procedure Laterality Date  . HIP SURGERY          Home Medications    Prior to Admission medications   Medication Sig Start Date End Date Taking? Authorizing Provider  acetaminophen (TYLENOL) 500 MG tablet Take 1,000 mg by mouth every 6 (six) hours as needed for mild pain or headache.   Yes [provider]  amLODipine (NORVASC) 5 MG tablet Take 1 tablet (5 mg total) by mouth daily. 02/17/18  Yes Gildardo Pounds, NP  benztropine (COGENTIN) 0.5 MG tablet Take 1 tablet (0.5 mg total) by mouth 2 (two) times daily. 12/24/14  Yes Niel Hummer, NP  diphenhydrAMINE (BENADRYL) 25 MG tablet Take 25 mg by mouth every 6 (six) hours as needed for itching or allergies.   Yes [provider]  divalproex (DEPAKOTE) 500 MG DR tablet Take 1,000 mg by mouth at bedtime.    Yes [provider]  haloperidol (HALDOL) 5 MG tablet Take 1 tablet (5 mg total) by mouth 2 (two) times daily. 12/24/14  Yes Niel Hummer, NP  hydrOXYzine (ATARAX/VISTARIL) 25 MG tablet Take 25 mg by mouth 3 (three) times daily as needed for anxiety.   Yes [provider]  ibuprofen (ADVIL,MOTRIN) 200 MG tablet Take 800 mg by mouth every 6 (six) hours as needed for mild pain or moderate pain.   Yes [provider]  naltrexone (DEPADE) 50 MG tablet Take 50 mg by mouth  daily.   Yes [provider]  omeprazole (PRILOSEC) 20 MG capsule Take 20 mg by mouth daily.   Yes [provider]  OVER THE COUNTER MEDICATION Take 1 Dose by mouth as needed (anxiety).   Yes [provider]  QUEtiapine (SEROQUEL) 25 MG tablet Take 25 mg by mouth at bedtime.    Yes [provider]  ibuprofen (ADVIL,MOTRIN) 800 MG tablet Take 1 tablet (800 mg total) by mouth 3 (three) times daily. Patient not taking: Reported on 10/21/2016 10/03/16   Lorayne Bender, PA-C    Family History Family History  Problem Relation Age of Onset  .  Hypertension Father   . Stroke Maternal Grandmother   . Diabetes Paternal Grandmother     Social History Social History   Tobacco Use  . Smoking status: Current Every Day Smoker    Packs/day: 1.00    Types: Cigarettes  . Smokeless tobacco: Former Network engineer Use Topics  . Alcohol use: Yes    Comment: one beer a day  . Drug use: Not Currently    Types: Methamphetamines, Heroin, Marijuana, IV    Comment: "huffs" air canisters      Allergies   Phenytoin sodium extended; Pineapple; and Risperdal [risperidone]   Review of Systems Review of Systems  Psychiatric/Behavioral: Positive for hallucinations.  All other systems reviewed and are negative.    Physical Exam Updated Vital Signs BP 130/89 (BP Location: Left Arm)   Pulse 88   Temp 97.8 F (36.6 C) (Oral)   Resp 14   Ht 6' (1.829 m)   Wt 104.3 kg (230 lb)   SpO2 96%   BMI 31.19 kg/m   Physical Exam  Constitutional: He is oriented to person, place, and time. He appears well-developed and well-nourished. No distress.  HENT:  Head: Normocephalic and atraumatic.  Mouth/Throat: Oropharynx is clear and moist.  Eyes: Pupils are equal, round, and reactive to light. Conjunctivae and EOM are normal.  Neck: Normal range of motion. Neck supple.  Cardiovascular: Normal rate, regular rhythm and normal heart sounds.  Pulmonary/Chest: Effort normal and breath sounds normal. No respiratory distress.  Abdominal: Soft. He exhibits no distension. There is no tenderness.  Musculoskeletal: Normal range of motion. He exhibits no edema or deformity.  Neurological: He is alert and oriented to person, place, and time.  Skin: Skin is warm and dry.  Psychiatric: He has a normal mood and affect.  Patient denies active SI or HI.  Nursing note and vitals reviewed.    ED Treatments / Results  Labs (all labs ordered are listed, but only abnormal results are displayed) Labs Reviewed  COMPREHENSIVE METABOLIC PANEL - Abnormal;  Notable for the following components:      Result Value   Sodium 130 (*)    Glucose, Bld 110 (*)    BUN <5 (*)    All other components within normal limits  ACETAMINOPHEN LEVEL - Abnormal; Notable for the following components:   Acetaminophen (Tylenol), Serum <10 (*)    All other components within normal limits  ETHANOL  SALICYLATE LEVEL  CBC  RAPID URINE DRUG SCREEN, HOSP PERFORMED    EKG None  Radiology No results found.  Procedures Procedures (including critical care time)  Medications Ordered in ED Medications  QUEtiapine (SEROQUEL) tablet 25 mg (has no administration in time range)  hydrOXYzine (ATARAX/VISTARIL) tablet 25 mg (has no administration in time range)  traZODone (DESYREL) tablet 100 mg (has no administration in time range)  divalproex (DEPAKOTE)  DR tablet 1,000 mg (has no administration in time range)     Initial Impression / Assessment and Plan / ED Course  I have reviewed the triage vital signs and the nursing notes.  Pertinent labs & imaging results that were available during my care of the patient were reviewed by me and considered in my medical decision making (see chart for details).     MDM  Screen complete  Patient is presenting for evaluation following reported inhalant abuse. He is without evidence of acute medical issue.  Patient will require evaluation by TTS for safety.    Patient is medically clear at this time for further evaluation by TTS and/or psychiatry.  Final disposition is dependent upon their evaluation and assessment.  Final Clinical Impressions(s) / ED Diagnoses   Final diagnoses:  Inhalant abuse [facility])    ED Discharge Orders    None       Valarie Merino, MD 03/12/18 1659

## 2018-03-12 NOTE — ED Triage Notes (Signed)
Per EMS: PT is having auditory hallucinations and is SI. Pt reports he took 2 cans worth of dust cleaner and huffed them. Pt is calm and cooperative per EMS and non-violent.

## 2018-03-12 NOTE — ED Notes (Signed)
Bed: [facility] Expected date:  Expected time:  Means of arrival:  Comments: [facility]

## 2018-03-12 NOTE — Patient Outreach (Signed)
CPSS met with the patient to provide substance use recovery support and help with recovery resources. Patient stated he does not know at this time if he would be interested in getting connected with recovery resources. CPSS talked to the patient about how we could help him get connected to resources to help him with his inhalent use, but patient still does not know at this time whether or not he wants help with recovery resources. CPSS talked to the patient in great detail about substance use recovery resources that are available in the [place] area including residential treatment, outpatient treatment, and support groups. Brian [name] CPSS will follow up with the patient tomorrow to engage the patient for substance use recovery support and for help with recovery resources.

## 2018-03-12 NOTE — ED Notes (Signed)
Pt A&O x 3, no distress noted, sleeping at present.  Calm & cooperative, monitoring for safety, Q 15 min checks in effect.

## 2018-03-12 NOTE — ED Notes (Signed)
Pt oriented to room and unit.  Patient is pleasant and cooperative.  Pt is very soft spoken and withdrawn.  Pt complains of hearing voices and that is why he tried to hurt himself.  Pt contracts for safety.  15 minute checks and video monitoring in place.
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
## 2018-03-13 DIAGNOSIS — F17210 Nicotine dependence, cigarettes, uncomplicated: Secondary | ICD-10-CM

## 2018-03-13 DIAGNOSIS — F1810 Inhalant abuse, uncomplicated: Secondary | ICD-10-CM

## 2018-03-13 DIAGNOSIS — R45851 Suicidal ideations: Secondary | ICD-10-CM

## 2018-03-13 DIAGNOSIS — F259 Schizoaffective disorder, unspecified: Secondary | ICD-10-CM

## 2018-03-13 NOTE — BH Assessment (Signed)
[facility] Assessment Progress Note  Per Buford Dresser, DO, this pt does not require psychiatric hospitalization at this time.  Pt is to be discharged from [facility] with recommendation to continue treatment with [facility].  This has been included in pt's discharge instructions.  Pt's nurse, Diane, has been notified.  Jalene Mullet, [place] Triage Specialist [phone]
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
## 2018-03-13 NOTE — Discharge Instructions (Signed)
For your mental health needs, you are advised to continue treatment with [facility]: ° °     [facility] °     [address] °     [place], [place] [address] °     [phone] °

## 2018-03-13 NOTE — ED Notes (Signed)
Pt discharged home. Discharged instructions read to pt who verbalized understanding. All belongings returned to pt who signed for same. Denies SI/HI, is not delusional and not responding to internal stimuli. Escorted pt to the ED exit.    

## 2018-03-13 NOTE — [facility] Suicide Risk Assessment (Cosign Needed)
Suicide Risk Assessment  Discharge Assessment   [facility] Discharge Suicide Risk Assessment   Principal Problem: Schizoaffective disorder, unspecified type [facility]) Discharge Diagnoses:  Patient Active Problem List   Diagnosis Date Noted  . Inhalant abuse ([place]) [F18.10]   . Sprain of left foot [S93.602A] 10/01/2016  . Essential hypertension [I10] 06/12/2015  . Esophageal reflux [K21.9]   . GERD (gastroesophageal reflux disease) [K21.9] 12/23/2014  . Fracture of ischium ([place]) [S32.609A] 12/20/2014  . Alcohol use disorder, severe, dependence ([place]) [F10.20] 12/19/2014  . Schizoaffective disorder, unspecified type ([place]) [F25.9] 01/12/2014    Class: Acute    Total Time spent with patient: 45 minutes  Musculoskeletal: Strength & Muscle Tone: within normal limits Gait & Station: normal Patient leans: N/A  Psychiatric Specialty Exam: Physical Exam  Constitutional: He is oriented to person, place, and time. He appears well-developed and well-nourished.  HENT:  Head: Normocephalic.  Respiratory: Effort normal.  Musculoskeletal: Normal range of motion.  Neurological: He is alert and oriented to person, place, and time.  Psychiatric: His speech is normal and behavior is normal. His mood appears anxious. Thought content is paranoid. Cognition and memory are normal. He expresses impulsivity. He exhibits a depressed mood.   Review of Systems  Psychiatric/Behavioral: Positive for depression and substance abuse. Negative for hallucinations, memory loss and suicidal ideas. The patient is nervous/anxious. The patient does not have insomnia.   All other systems reviewed and are negative.  Blood pressure 116/76, pulse 82, temperature 98.3 F (36.8 C), temperature source Oral, resp. rate 16, height 6' (1.829 m), weight 230 lb (104.3 kg), SpO2 97 %.Body mass index is 31.19 kg/m. General Appearance: Casual Eye Contact:  Good Speech:  Clear and Coherent and Normal Rate Volume:  Normal Mood:  Anxious  and Depressed Affect:  Congruent and Depressed Thought Process:  Coherent, Linear and Descriptions of Associations: Intact Orientation:  Full (Time, Place, and Person) Thought Content:  Logical Suicidal Thoughts:  No Homicidal Thoughts:  No Memory:  Immediate;   Good Recent;   Good Remote;   Fair Judgement:  Fair Insight:  Fair Psychomotor Activity:  Normal Concentration:  Concentration: Good and Attention Span: Good Recall:  Good Fund of Knowledge:  Good Language:  Good Akathisia:  No Handed:  Right AIMS (if indicated):    Assets:  Agricultural consultant Housing Social Support ADL's:  Intact Cognition:  WNL Sleep:   Good   Mental Status Per Nursing Assessment::   On Admission:   Suicidal ideation   Demographic Factors:  Male, Caucasian, Low socioeconomic status and Unemployed  Loss Factors: Financial problems/change in socioeconomic status  Historical Factors: Impulsivity  Risk Reduction Factors:   Sense of responsibility to family and Living with another person, especially a relative  Continued Clinical Symptoms:  Severe Anxiety and/or Agitation Depression:   Impulsivity Alcohol/Substance Abuse/Dependencies  Cognitive Features That Contribute To Risk:  Closed-mindedness    Suicide Risk:  Minimal: No identifiable suicidal ideation.  Patients presenting with no risk factors but with morbid ruminations; may be classified as minimal risk based on the severity of the depressive symptoms   Plan Of Care/Follow-up recommendations:  Activity:  as tolerated Diet:  Heart Healthy  Ethelene Hal, NP 03/13/2018, 11:59 AM

## 2018-03-20 ENCOUNTER — Encounter: Payer: Self-pay | Admitting: Nurse Practitioner

## 2018-03-20 ENCOUNTER — Ambulatory Visit: Payer: Self-pay | Attending: Nurse Practitioner | Admitting: Nurse Practitioner

## 2018-03-20 VITALS — BP 132/96 | HR 92 | Temp 98.1°F | Ht 72.0 in | Wt 226.4 lb

## 2018-03-20 DIAGNOSIS — Z79899 Other long term (current) drug therapy: Secondary | ICD-10-CM | POA: Insufficient documentation

## 2018-03-20 DIAGNOSIS — F4310 Post-traumatic stress disorder, unspecified: Secondary | ICD-10-CM | POA: Insufficient documentation

## 2018-03-20 DIAGNOSIS — R6889 Other general symptoms and signs: Secondary | ICD-10-CM

## 2018-03-20 DIAGNOSIS — Z888 Allergy status to other drugs, medicaments and biological substances status: Secondary | ICD-10-CM | POA: Insufficient documentation

## 2018-03-20 DIAGNOSIS — H5789 Other specified disorders of eye and adnexa: Secondary | ICD-10-CM | POA: Insufficient documentation

## 2018-03-20 DIAGNOSIS — Z0000 Encounter for general adult medical examination without abnormal findings: Secondary | ICD-10-CM

## 2018-03-20 DIAGNOSIS — K229 Disease of esophagus, unspecified: Secondary | ICD-10-CM | POA: Insufficient documentation

## 2018-03-20 DIAGNOSIS — I10 Essential (primary) hypertension: Secondary | ICD-10-CM | POA: Insufficient documentation

## 2018-03-20 DIAGNOSIS — F319 Bipolar disorder, unspecified: Secondary | ICD-10-CM | POA: Insufficient documentation

## 2018-03-20 DIAGNOSIS — Z8249 Family history of ischemic heart disease and other diseases of the circulatory system: Secondary | ICD-10-CM | POA: Insufficient documentation

## 2018-03-20 DIAGNOSIS — Z8619 Personal history of other infectious and parasitic diseases: Secondary | ICD-10-CM | POA: Insufficient documentation

## 2018-03-20 MED ORDER — OLOPATADINE HCL 0.2 % OP SOLN: 1.0000 [drp] | Freq: Every day | OPHTHALMIC | 1 refills | Status: DC

## 2018-03-20 NOTE — Progress Notes (Signed)
Assessment & Plan:  Brian [name] was seen today for annual exam.  Diagnoses and all orders for this visit:  Annual physical exam  Esophageal abnormality -     Ambulatory referral to ENT  Itchy eyes -     Olopatadine HCl 0.2 % SOLN; Apply 1 drop to eye daily.    Patient has been counseled on age-appropriate routine health concerns for screening and prevention. These are reviewed and up-to-date. Referrals have been placed accordingly. Immunizations are up-to-date or declined.    Subjective:   Chief Complaint  Patient presents with  . Annual Exam    Patient is here for a physical.    HPI Brian [name] 32 y.o. male presents to office today for annual physical. He is accompanied by his mother today.  He was seen in the ED on 03-12-2018 with complaints of auditory hallucination. It was also noted that he had been "huffing" prior to the ED visit. 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 He is currently being followed by [facility] for [facility] services. He does not attend and NA or AA groups nor does he have a sponsor. Reports one day he was going to a meeting and his sponsor picked him up "wasted". So he does not have a positive history with sponsors being supportive. In the ED he was treated with seroquel, vistaril, trazodone and depakote. He was transferred to [facility] for follow up and then discharged home in stable condition.    Review of Systems  Constitutional: Negative for fever, malaise/fatigue and weight loss.  HENT: Negative.  Negative for nosebleeds.        "gagging" on food  Eyes: Positive for discharge. Negative for blurred vision, double vision and photophobia.       Itchy, watery eyes  Respiratory: Negative.  Negative for cough and shortness of breath.   Cardiovascular: Negative.  Negative for chest pain, palpitations and leg swelling.  Gastrointestinal: Negative.  Negative for heartburn, nausea and vomiting.  Genitourinary: Negative.   Musculoskeletal: Negative.   Negative for myalgias.  Skin: Negative.   Neurological: Negative.  Negative for dizziness, focal weakness, seizures and headaches.  Endo/Heme/Allergies: Negative.   Psychiatric/Behavioral: Positive for substance abuse. Negative for suicidal ideas. The patient has insomnia.     Past Medical History:  Diagnosis Date  . Anxiety   . Bipolar 1 disorder ([place])   . Depression   . Hepatitis C   . Hypertension   . PTSD (post-traumatic stress disorder)     Past Surgical History:  Procedure Laterality Date  . HIP SURGERY      Family History  Problem Relation Age of Onset  . Hypertension Father   . Stroke Maternal Grandmother   . Diabetes Paternal Grandmother     Social History Reviewed with no changes to be made today.   Outpatient Medications Prior to Visit  Medication Sig Dispense Refill  . acetaminophen (TYLENOL) 500 MG tablet Take 1,000 mg by mouth every 6 (six) hours as needed for mild pain or headache.    Marland Kitchen amLODipine (NORVASC) 5 MG tablet Take 1 tablet (5 mg total) by mouth daily. 90 tablet 3  . benztropine (COGENTIN) 0.5 MG tablet Take 1 tablet (0.5 mg total) by mouth 2 (two) times daily. 60 tablet 0  . diphenhydrAMINE (BENADRYL) 25 MG tablet Take 25 mg by mouth every 6 (six) hours as needed for itching or allergies.    Marland Kitchen divalproex (DEPAKOTE) 500 MG DR tablet Take 1,000 mg by mouth at  bedtime.     . haloperidol (HALDOL) 5 MG tablet Take 1 tablet (5 mg total) by mouth 2 (two) times daily. 60 tablet 0  . hydrOXYzine (ATARAX/VISTARIL) 25 MG tablet Take 25 mg by mouth 3 (three) times daily as needed for anxiety.    Marland Kitchen ibuprofen (ADVIL,MOTRIN) 200 MG tablet Take 800 mg by mouth every 6 (six) hours as needed for mild pain or moderate pain.    . naltrexone (DEPADE) 50 MG tablet Take 50 mg by mouth daily.    Marland Kitchen omeprazole (PRILOSEC) 20 MG capsule Take 20 mg by mouth daily.    . QUEtiapine (SEROQUEL) 25 MG tablet Take 25 mg by mouth at bedtime.     Marland Kitchen ibuprofen (ADVIL,MOTRIN) 800 MG  tablet Take 1 tablet (800 mg total) by mouth 3 (three) times daily. (Patient not taking: Reported on 10/21/2016) 21 tablet 0  . OVER THE COUNTER MEDICATION Take 1 Dose by mouth as needed (anxiety).     No facility-administered medications prior to visit.     Allergies  Allergen Reactions  . Phenytoin Sodium Extended Other (See Comments)    Gets sick to his stomach, and closes throat up  . Pineapple Anaphylaxis  . Risperdal [Risperidone] Shortness Of Breath    'throat closes up'       Objective:    BP (!) 132/96 (BP Location: Right Arm, Patient Position: Sitting, Cuff Size: Large)   Pulse 92   Temp 98.1 F (36.7 C) (Oral)   Ht 6' (1.829 m)   Wt 226 lb 6.4 oz (102.7 kg)   SpO2 96%   BMI 30.71 kg/m  Wt Readings from Last 3 Encounters:  03/20/18 226 lb 6.4 oz (102.7 kg)  03/12/18 230 lb (104.3 kg)  02/17/18 226 lb (102.5 kg)    Physical Exam  Constitutional: He is oriented to person, place, and time. He appears well-developed and well-nourished.  HENT:  Head: Normocephalic and atraumatic.  Right Ear: Hearing, tympanic membrane, external ear and ear canal normal.  Left Ear: Hearing, tympanic membrane, external ear and ear canal normal.  Nose: Nose normal. No mucosal edema or rhinorrhea.  Mouth/Throat: Uvula is midline and mucous membranes are normal. He has dentures (full set). Posterior oropharyngeal edema present. Tonsils are 2+ on the right. Tonsils are 2+ on the left. No tonsillar exudate.  Eyes: Pupils are equal, round, and reactive to light. Conjunctivae, EOM and lids are normal. Right conjunctiva is not injected. Right conjunctiva has no hemorrhage. Left conjunctiva is not injected. Left conjunctiva has no hemorrhage. No scleral icterus.  Neck: Normal range of motion. Neck supple. No tracheal deviation present. No thyromegaly present.  Cardiovascular: Normal rate, regular rhythm, normal heart sounds and intact distal pulses. Exam reveals no gallop and no friction rub.  No  murmur heard. Pulses:      Dorsalis pedis pulses are 2+ on the right side, and 1+ on the left side.       Posterior tibial pulses are 2+ on the right side, and 1+ on the left side.  Pulmonary/Chest: Effort normal and breath sounds normal. No respiratory distress. He has no wheezes. He has no rales. He exhibits no mass and no tenderness. Right breast exhibits no inverted nipple, no mass, no nipple discharge, no skin change and no tenderness. Left breast exhibits no inverted nipple, no mass, no nipple discharge, no skin change and no tenderness.  Abdominal: Soft. Bowel sounds are normal. He exhibits no distension and no mass. There is no tenderness. There  is no rebound and no guarding. Hernia confirmed negative in the right inguinal area and confirmed negative in the left inguinal area.  Genitourinary: Testes normal and penis normal. Right testis shows no mass, no swelling and no tenderness. Right testis is descended. Cremasteric reflex is not absent on the right side. Left testis shows no mass, no swelling and no tenderness. Left testis is descended. Cremasteric reflex is not absent on the left side.  Musculoskeletal: Normal range of motion. He exhibits no edema, tenderness or deformity.  Feet:  Right Foot:  Skin Integrity: Negative for ulcer, blister or skin breakdown.  Left Foot:  Skin Integrity: Negative for ulcer, blister or skin breakdown.  Lymphadenopathy:    He has no cervical adenopathy. No inguinal adenopathy noted on the right or left side.       Right: No inguinal adenopathy present.       Left: No inguinal adenopathy present.  Neurological: He is alert and oriented to person, place, and time. He displays normal reflexes. No cranial nerve deficit. He exhibits normal muscle tone. Coordination normal.  Skin: Skin is warm and dry. Capillary refill takes less than 2 seconds. No erythema.  Psychiatric: He has a normal mood and affect. His behavior is normal. Judgment and thought content  normal.         Patient has been counseled extensively about nutrition and exercise as well as the importance of adherence with medications and regular follow-up. The patient was given clear instructions to go to ER or return to medical center if symptoms don't improve, worsen or new problems develop. The patient verbalized understanding.   Follow-up: Return in about 6 weeks (around 05/01/2018) for Fasting lab appointment .   Gildardo Pounds, FNP-BC [facility] and [place], [place]   03/20/2018, 1:39 PM

## 2018-03-20 NOTE — Patient Instructions (Signed)
Addiction and the Family What is addiction? Addiction is a complex disease of the brain. It causes an uncontrollable (compulsive) need for a substance. You can be addicted to alcohol or illegal drugs or to prescription medicines, such as painkillers. Addiction can also be a behavior, like gambling or shopping. The need for the drug or activity can become so strong that you think about it all the time. You can also become physically dependent on a substance. Addiction can change the way your brain works. Because of these changes, getting more of whatever you are addicted to becomes the most important thing to you and feels better than other activities or relationships. Addiction can lead to changes in health, behavior, emotions, relationships, and choices that affect you and everyone around you. What are common signs of addiction? Addiction can cause behavioral and emotional signs, as well as physical signs. Behavioral signs of addiction may include:  Having an intense craving for whatever you are addicted to.  Always thinking about your addiction.  Planning your life around your addiction.  Being unable to stop using a substance or participating in a behavior.  Devoting more time to the addiction. This might mean you no longer go to school or work or spend time with people you enjoy.  Having an increasing need for money. An addiction might make you ask people for unusual loans or steal items to sell.  Having exaggerated emotional responses to difficult situations. These may include: ? Feeling anxious or stressed out. ? Extreme irritability. ? Aggression. ? Lying.  Continuing with the addiction even after it has caused a bad outcome, such as: ? Poor health. ? Damaged relationships. ? Money loss. ? Job loss. ? Getting hurt or arrested.  Having trouble being realistic about the negative effects of addiction.  Physical signs of addiction may include:  Bloodshot  eyes.  Nosebleeds.  Poor hygiene.  Changing sleep patterns.  Shakes and tremors.  Slurred speech.  Confusion.  Unconsciousness.  How can addiction affect family members? Addiction affects everyone in a family. It touches all aspects of family life, including finances, communication, work, school, and free time. Children of an addict might have:  Birth defects, if the mother was addicted during pregnancy.  Physical, emotional, and behavioral problems.  Trouble in school.  Injury from violence, abuse, or accidents.  Problems because of neglect.  A greater risk for addiction later in life.  Parents of an addict might experience:  Confusion.  Worry.  Guilt.  Fear.  Sadness.  A desire to make the addiction seem less of a problem than it is.  Financial strain.  Feeling isolated from family and friends.  Physical health changes from stress or from violent confrontations.  Brothers, sisters, and extended family members of an addict might experience:  Worry.  Anger.  Fear.  Resentment.  Confusion.  A desire to hide the addiction and its impact.  Financial strain.  How do I know if treatment for addiction is needed? Addiction is a progressive disease. Without treatment, addiction can get worse. Living with addiction puts you at higher risk for injury, poor health, lost employment, loss of money, and even death. You might need treatment for addiction if:  You have tried to stop or cut down, and you cannot.  Your addiction is causing physical health problems.  You find it annoying that your friends and family are concerned about your alcohol or substance use.  You feel guilty about substance abuse or a behavior.  You have  lied or tried to hide your addiction.  You need a particular substance to start your day or calm down.  You are getting in trouble at school, work, home, or with the police.  You have done something illegal to support your  addiction.  You are running out of money because of your addiction.  You have no time for anything other than your addiction.  What types of treatment options are available?  Treatment for the addict The treatment program that is right for you will depend on many factors, including the type of addiction you have. Treatment programs can be outpatient or inpatient. In an outpatient program, you live at home and go to work but also go to a clinic for treatment. With an inpatient program, you sleep and live at the program facility during treatment. After treatment, you might need a plan for support during recovery. Other treatment options include:  Medicine. ? Some addictions may be treated with prescription medicines. ? You might also need medicine to treat anxiety or depression.  Counseling and behavior therapy. Therapy can help individuals and families behave and relate more effectively.  Support groups. Confidential group therapy, such as twelve-step program, can help individuals and families during treatment and recovery.  Treatment for family members Addiction affects the entire family. Ask your health care provider or counselor to recommend resources for family members. You can call [employer] at 531-838-4584 or visit their website at http://www.nar-anon.org/find-a-group/ Where else can I get help?  Ask your health care provider for help finding addiction treatment. These discussions are confidential.  The [employer] on Alcoholism and Drug Dependence (NCADD). This group has information on treatment centers and programs for people who have an addiction and for family members. ? Their telephone number is 1-800-NCA-CALL. ? Their website is https://ncadd.org/affiliate-network/find-an-affiliate  The [facility] Mental [facility]). This group will help you find publicly funded treatment centers, help hotlines, and counseling services near  you. ? Their telephone number is 1-800-662-HELP (4357). ? Their website is www.findtreatment.[employer].com.cy This information is not intended to replace advice given to you by your health care provider. Make sure you discuss any questions you have with your health care provider. Document Released: 04/14/2004 Document Revised: 07/26/2016 Document Reviewed: 10/29/2013 Elsevier Interactive Patient Education  2017 [employer].  Finding Treatment for Addiction What is addiction? Addiction is a complex disease of the brain. It causes an uncontrollable (compulsive) need for a substance. You can be addicted to alcohol, illegal drugs, or prescription medicines such as painkillers. Addiction can also be a behavior, like gambling or shopping. The need for the drug or activity can become so strong that you think about it all the time. You can also become physically dependent on a substance. Addiction can change the way your brain works. Because of these changes, getting more of whatever you are addicted to becomes the most important thing to you and feels better than other activities or relationships. Addiction can lead to changes in health, behavior, emotions, relationships, and choices that affect you and everyone around you. How do I know if I need treatment for addiction? Addiction is a progressive disease. Without treatment, addiction can get worse. Living with addiction puts you at higher risk for injury, poor health, lost employment, loss of money, and even death. You might need treatment for addiction if:  You have tried to stop or cut down, but you cannot.  Your addiction is causing physical health problems.  You find it annoying that  your friends and family are concerned about your alcohol or substance use.  You feel guilty about substance abuse or a compulsive behavior.  You have lied or tried to hide your addiction.  You need a particular substance or activity to start your day or to calm  down.  You are getting in trouble at school, work, home, or with the police.  You have done something illegal to support your addiction.  You are running out of money because of your addiction.  You have no time for anything other than your addiction.  What types of treatment are available? The treatment program that is right for you will depend on many factors, including the type of addiction you have. Treatment programs can be outpatient or inpatient. In an outpatient program, you live at home and go to work or school, but you also go to a clinic for treatment. With an inpatient program, you live and sleep at the program facility during treatment. After treatment, you might need a plan for support during recovery. Other treatment options include:  Medicine. ? Some addictions may be treated with prescription medicines. ? You might also need medicine to treat anxiety or depression.  Counseling and behavior therapy. Therapy can help individuals and families behave in healthier ways and relate more effectively.  Support groups. Confidential group therapy, such as a 12-step program, can help individuals and families during treatment and recovery.  No single type of program is right for everyone. Many treatment programs involve a combination of education, counseling, and a 12-step, spiritually-based approach. Some treatment programs are government sponsored. They are geared for patients who do not have private insurance. Treatment programs can vary in many respects, such as:  Cost and types of insurance that are accepted.  Types of on-site medical services that are offered.  Length of stay, setting, and size.  Overall philosophy of treatment.  What should I consider when selecting a treatment program? It is important to think about your individual requirements when selecting a treatment program. There are a number of things to consider, such as:  If the program is certified by the  appropriate government agency. Even private programs must be certified and employ certified professionals.  If the program is covered by your insurance. If finances are a concern, the first call you should make is to your insurance company, if you have health insurance. Ask for a list of treatment programs that are in your network, and confirm any copayments and deductibles that you may have to pay. ? If you do not have insurance, or if you choose to attend a program that does not accept your insurance, discuss whether a payment plan can be set up.  If treatment is available in languages other than English, if needed.  If the program offers detoxification treatment, if needed.  If 12-step meetings are held at the center or if transport is available for patients to attend meetings at other locations.  If the program is professional, organized, and clean.  If the program meets all of your needs, including physical and cultural needs.  If the facility offers specific treatment for your particular addiction.  If support continues to be offered after you have left the program.  If your treatment plan is continually looked at to make sure you are receiving the right treatment at the right time.  If mental health counseling is part of your treatment.  If medicine is included in treatment, if needed.  If your family is included  in your treatment plan and if support is offered to them throughout the treatment process.  How the treatment works to prevent relapse.  Where else can I get help?  Your health care provider. Ask him or her to help you find addiction treatment. These discussions are confidential.  The [employer] on Alcoholism and Drug Dependence (NCADD). This group has information about treatment centers and programs for people who have an addiction and for family members. ? The telephone number is 1-800-NCA-CALL (334-215-5427). ? The website is  https://ncadd.org/about-ncadd/our-affiliates  The [facility] and Mental [facility]). This group will help you find publicly funded treatment centers, help hotlines, and counseling services near you. ? The telephone number is 1-800-662-HELP 660 448 3923). ? The website is www.findtreatment.SamedayNews.com.cy In countries outside of the [place] and [place], look in [employer] for contact information for services in your area. This information is not intended to replace advice given to you by your health care provider. Make sure you discuss any questions you have with your health care provider. Document Released: 07/08/2005 Document Revised: 07/05/2016 Document Reviewed: 05/28/2014 Elsevier Interactive Patient Education  2017 [place].  Inhalant Use Disorder Inhalant use disorder is a mental disorder. It is repeated use of inhalants that causes problems. People with this disorder breathe in (inhale) harmful fumes from common household products on purpose to obtain a feeling of well-being (euphoria). These products commonly include:  Solvents. These are cleaning fluids such as paint thinner, nail polish remover, carpet cleaner, and degreasers.  Aerosols. These include hair spray, spray deodorants, spray paint, air fresheners, and computer air dusters.  Glues and adhesives.  Fuels, such as gasoline, lighter fluid (butane), and propane.  Other products including nail polish, shoe polish, liquid correction fluid, and felt-tip markers.  The fumes may be inhaled through the nose, mouth, or both in the following ways:  Directly from the container. This is known as "glading" with air freshener and "dusting" with computer air dusters.  From a bag filled with fumes from the container.  From a rag soaked with liquid (huffing).  Inhalants cause short-term effects of intoxication like alcohol does. These may include euphoria, dizziness, loss of coordination, slurred  speech, slowing of movement or thinking, shaking, muscle weakness, and changes in vision. The effects start rapidly and do not last long. Some people use inhalants repeatedly or continuously to maintain the effects. Severe inhalant intoxication may cause death due to impaired driving, coma, suffocation, or sudden abnormal heartbeats. Continued use of inhalants over time may interfere with normal life activities or cause health problems. What increases the risk? Inhalant use disorder is most common in the early teenage years. It affects females and males equally and usually stops in young adulthood. Inhalant use disorder is more likely to develop if you have:  A family history of inhalant abuse.  A history of drug use.  A mental illness, such as depression, anxiety, or antisocial personality disorder.  What are the signs or symptoms? You may have inhalant use disorder if you have two or more of the following signs and symptoms within a 40-month period:  You use inhalants in larger amounts or over a longer period of time than planned.  You try to cut down or control inhalant use, but you are not able to.  You spend a lot of time obtaining or using inhalants or recovering from the effects.  You have a strong desire or urge (craving) to use inhalants.  You continue to use inhalants even  if you have major problems at work, school, or home because of inhalant use.  You continue to use inhalants even if you have relationship problems because of inhalant use.  You give up or cut down on important life activities because of inhalant use.  You use inhalants repeatedly in situations when it is unsafe, such as while driving a car.  You continue to use inhalants even though you know that you have: ? A physical problem that could be related to inhalant use. Physical problems may include sores around the nose or mouth ("glue-sniffer's rash"), chronic runny nose or cough, lung problems, vision loss,  chronic pain due to nerve damage, abnormal movements due to brain damage, and liver or kidney damage. ? A mental problem that could be related to inhalant use. Mental problems may include depression, anxiety, seeing things that are not there (hallucinations) or feeling paranoid (psychosis), and problems with memory or thinking.  You have to use higher amounts of inhalants to get the same or desired effect (tolerance).  How is this diagnosed? Inhalant use disorder is diagnosed through an assessment by your health care provider. Your health care provider may:  Ask questions about your inhalant use and any problems that it may be causing.  Perform a physical exam and order drug screens or other lab tests.  Refer you to a mental health professional for evaluation.  The severity of inhalant use disorder depends on the number of signs and symptoms you have:  Mild-2 or 3 symptoms.  Moderate-4 or 5 symptoms.  Severe-6 or more symptoms.  How is this treated? Treatment is usually provided by mental health specialists. The following options are available:  Counseling or talk therapy. Talk therapy addresses the reasons why you use inhalants and teaches you ways to stop from using again. Goals of talk therapy include: ? Identifying and avoiding triggers for inhalant use. ? Handling cravings. ? Replacing inhalant use with healthy activities.  Support groups. These are led by people who have quit using inhalants or other drugs. They provide emotional support, advice, and guidance.  Medicine.Certain medicines may lessen cravings and inhalant use. Medicines are also used to treat mental problems that are caused by inhalant use.  The most effective treatment is usually a combination of talk therapy, support groups, and medicine.  Follow these instructions at home:  Take medicines only as directed by your health care provider. Check with your health care provider before starting any new  prescription or over-the-counter medicines.  Keep all follow-up visits as directed by your health care provider. This is important. Contact a health care provider if:  Your symptoms get worse or you develop new symptoms.  You are not able to take medicines as directed. Get help right away if:  You have serious thoughts about hurting yourself or someone else.  You have trouble breathing, you cough up blood, or you pass out.  You develop pain that is getting worse or is not controlled with medicines. This information is not intended to replace advice given to you by your health care provider. Make sure you discuss any questions you have with your health care provider. Document Released: 11/15/2000 Document Revised: 01/15/2016 Document Reviewed: 10/12/2013 Elsevier Interactive Patient Education  [employer].

## 2018-03-31 ENCOUNTER — Ambulatory Visit: Payer: Self-pay

## 2018-04-19 ENCOUNTER — Ambulatory Visit: Payer: Self-pay | Attending: Nurse Practitioner

## 2018-04-29 ENCOUNTER — Encounter (HOSPITAL_COMMUNITY): Payer: Self-pay | Admitting: Emergency Medicine

## 2018-04-29 ENCOUNTER — Emergency Department (HOSPITAL_COMMUNITY)
Admission: EM | Admit: 2018-04-29 | Discharge: 2018-04-30 | Disposition: A | Discharge status: Home / Self Care | Payer: Self-pay | Attending: Emergency Medicine | Admitting: Emergency Medicine

## 2018-04-29 DIAGNOSIS — R45850 Homicidal ideations: Secondary | ICD-10-CM | POA: Insufficient documentation

## 2018-04-29 DIAGNOSIS — Z4800 Encounter for change or removal of nonsurgical wound dressing: Secondary | ICD-10-CM | POA: Insufficient documentation

## 2018-04-29 DIAGNOSIS — I10 Essential (primary) hypertension: Secondary | ICD-10-CM | POA: Insufficient documentation

## 2018-04-29 DIAGNOSIS — F17210 Nicotine dependence, cigarettes, uncomplicated: Secondary | ICD-10-CM | POA: Insufficient documentation

## 2018-04-29 DIAGNOSIS — F4310 Post-traumatic stress disorder, unspecified: Secondary | ICD-10-CM | POA: Insufficient documentation

## 2018-04-29 DIAGNOSIS — R6883 Chills (without fever): Secondary | ICD-10-CM | POA: Insufficient documentation

## 2018-04-29 DIAGNOSIS — Z5189 Encounter for other specified aftercare: Secondary | ICD-10-CM

## 2018-04-29 DIAGNOSIS — Z79899 Other long term (current) drug therapy: Secondary | ICD-10-CM | POA: Insufficient documentation

## 2018-04-29 DIAGNOSIS — F250 Schizoaffective disorder, bipolar type: Secondary | ICD-10-CM | POA: Diagnosis present

## 2018-04-29 DIAGNOSIS — R45851 Suicidal ideations: Secondary | ICD-10-CM | POA: Insufficient documentation

## 2018-04-29 DIAGNOSIS — F1810 Inhalant abuse, uncomplicated: Secondary | ICD-10-CM | POA: Diagnosis present

## 2018-04-29 LAB — BASIC METABOLIC PANEL
Anion gap: 13 (ref 5–15)
CO2: 21 mmol/L — ABNORMAL LOW (ref 22–32)
CREATININE: 0.77 mg/dL (ref 0.61–1.24)
Calcium: 9.7 mg/dL (ref 8.9–10.3)
Chloride: 103 mmol/L (ref 98–111)
GFR calc Af Amer: >60 mL/min (ref >60)
GLUCOSE: 101 mg/dL — AB (ref 70–99)
POTASSIUM: 4.5 mmol/L (ref 3.5–5.1)
Sodium: 137 mmol/L (ref 135–145)

## 2018-04-29 LAB — CBC WITH DIFFERENTIAL/PLATELET
Basophils Absolute: 0 10*3/uL (ref 0.0–0.1)
Basophils Relative: 0 %
EOS PCT: 1 %
Eosinophils Absolute: 0 10*3/uL (ref 0.0–0.7)
HCT: 46.4 % (ref 39.0–52.0)
Hemoglobin: 17 g/dL (ref 13.0–17.0)
LYMPHS ABS: 1.8 10*3/uL (ref 0.7–4.0)
Lymphocytes Relative: 25 %
MCH: 31.8 pg (ref 26.0–34.0)
MCHC: 36.6 g/dL — AB (ref 30.0–36.0)
MCV: 86.9 fL (ref 78.0–100.0)
MONO ABS: 0.7 10*3/uL (ref 0.1–1.0)
MONOS PCT: 9 %
Neutro Abs: 4.8 10*3/uL (ref 1.7–7.7)
Neutrophils Relative %: 65 %
PLATELETS: 301 10*3/uL (ref 150–400)
RBC: 5.34 MIL/uL (ref 4.22–5.81)
RDW: 12.1 % (ref 11.5–15.5)
WBC: 7.3 10*3/uL (ref 4.0–10.5)

## 2018-04-29 LAB — RAPID URINE DRUG SCREEN, HOSP PERFORMED
AMPHETAMINES: NOT DETECTED
Barbiturates: NOT DETECTED
Benzodiazepines: NOT DETECTED
Cocaine: NOT DETECTED
Opiates: NOT DETECTED
TETRAHYDROCANNABINOL: NOT DETECTED

## 2018-04-29 LAB — ACETAMINOPHEN LEVEL

## 2018-04-29 LAB — SALICYLATE LEVEL: Salicylate Lvl: <7 mg/dL (ref 2.8–30.0)

## 2018-04-29 LAB — ETHANOL: ALCOHOL ETHYL (B): 85 mg/dL — AB (ref <10)

## 2018-04-29 MED ORDER — LORAZEPAM 1 MG PO TABS: 0.0000 mg | ORAL_TABLET | Freq: Two times a day (BID) | ORAL | Status: DC

## 2018-04-29 MED ORDER — THIAMINE HCL 100 MG/ML IJ SOLN: 100.0000 mg | Freq: Every day | INTRAMUSCULAR | Status: DC

## 2018-04-29 MED ORDER — LORAZEPAM 2 MG/ML IJ SOLN: 0.0000 mg | Freq: Two times a day (BID) | INTRAMUSCULAR | Status: DC

## 2018-04-29 MED ORDER — AMLODIPINE BESYLATE 5 MG PO TABS
5.0000 mg | ORAL_TABLET | Freq: Every day | ORAL | Status: DC
  Administered 2018-04-30: 5 mg via ORAL
  Filled 2018-04-29: qty 1

## 2018-04-29 MED ORDER — LORAZEPAM 1 MG PO TABS
0.0000 mg | ORAL_TABLET | Freq: Four times a day (QID) | ORAL | Status: DC
  Administered 2018-04-29: 1 mg via ORAL
  Filled 2018-04-29: qty 1

## 2018-04-29 MED ORDER — BACITRACIN ZINC 500 UNIT/GM EX OINT
TOPICAL_OINTMENT | Freq: Two times a day (BID) | CUTANEOUS | Status: DC
  Administered 2018-04-29 – 2018-04-30 (×2): 1 via TOPICAL
  Filled 2018-04-29 (×3): qty 0.9

## 2018-04-29 MED ORDER — BENZTROPINE MESYLATE 0.5 MG PO TABS
0.5000 mg | ORAL_TABLET | Freq: Two times a day (BID) | ORAL | Status: DC
  Administered 2018-04-29 – 2018-04-30 (×2): 0.5 mg via ORAL
  Filled 2018-04-29 (×2): qty 1

## 2018-04-29 MED ORDER — DIVALPROEX SODIUM 500 MG PO DR TAB
500.0000 mg | DELAYED_RELEASE_TABLET | Freq: Two times a day (BID) | ORAL | Status: DC
  Administered 2018-04-29 – 2018-04-30 (×2): 500 mg via ORAL
  Filled 2018-04-29 (×2): qty 1

## 2018-04-29 MED ORDER — LORAZEPAM 2 MG/ML IJ SOLN: 0.0000 mg | Freq: Four times a day (QID) | INTRAMUSCULAR | Status: DC

## 2018-04-29 MED ORDER — VITAMIN B-1 100 MG PO TABS
100.0000 mg | ORAL_TABLET | Freq: Every day | ORAL | Status: DC
  Administered 2018-04-29 – 2018-04-30 (×2): 100 mg via ORAL
  Filled 2018-04-29 (×2): qty 1

## 2018-04-29 MED ORDER — HALOPERIDOL 5 MG PO TABS
10.0000 mg | ORAL_TABLET | Freq: Two times a day (BID) | ORAL | Status: DC
  Administered 2018-04-29 – 2018-04-30 (×2): 10 mg via ORAL
  Filled 2018-04-29 (×2): qty 2

## 2018-04-29 MED ORDER — IBUPROFEN 800 MG PO TABS: 800.0000 mg | ORAL_TABLET | Freq: Four times a day (QID) | ORAL | Status: DC | PRN

## 2018-04-29 MED ORDER — HYDROXYZINE HCL 25 MG PO TABS
25.0000 mg | ORAL_TABLET | Freq: Two times a day (BID) | ORAL | Status: DC | PRN
  Administered 2018-04-30: 25 mg via ORAL
  Filled 2018-04-29: qty 1

## 2018-04-29 MED ORDER — NALTREXONE HCL 50 MG PO TABS
50.0000 mg | ORAL_TABLET | Freq: Every day | ORAL | Status: DC
  Administered 2018-04-30: 50 mg via ORAL
  Filled 2018-04-29: qty 1

## 2018-04-29 MED ORDER — PANTOPRAZOLE SODIUM 40 MG PO TBEC
40.0000 mg | DELAYED_RELEASE_TABLET | Freq: Every day | ORAL | Status: DC
  Administered 2018-04-29 – 2018-04-30 (×2): 40 mg via ORAL
  Filled 2018-04-29 (×2): qty 1
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
## 2018-04-29 NOTE — ED Notes (Signed)
Pt ambulatory from fast track w/o difficulty. Sitter present, supper given

## 2018-04-29 NOTE — ED Notes (Signed)
Pt's mother into see 
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
## 2018-04-29 NOTE — ED Notes (Addendum)
Pt reports that he has been off his medications for about 3 days and is having AVH.  Pt reports that the AVH has gotten worse since his MD stopped his AM dose of seroquel and since he stopped taking his medications.  Pt reports that the visions are of people and he can see their faces.  Pt also reports that the hears voices, but did not share what if anything they are telling him.  Pt did not answer when asked about SI, and answered he wouldn't hurt any body when asked about HI.  Pt reports that he has been having difficulty sleeping at night due to the vision and has been up since 0100 last night.  Pt reports that he lives with his mother. wound lt leg was treated in triage.

## 2018-04-29 NOTE — ED Triage Notes (Addendum)
Patient here with [employer]. Reports that he tried to check in at [facility] but they would not take him due to open wound on left leg. HI, voices telling him to kill people.

## 2018-04-29 NOTE — ED Notes (Signed)
Bed: LR37 Expected date:  Expected time:  Means of arrival:  Comments: Triage 5

## 2018-04-29 NOTE — BH Assessment (Signed)
[facility] Assessment Progress Note Case was staffed with Lord DNP who recommended patient be monitored and observed for safety. Patient will be seen by psychiatry in the a.m.        

## 2018-04-30 DIAGNOSIS — F1810 Inhalant abuse, uncomplicated: Secondary | ICD-10-CM

## 2018-04-30 DIAGNOSIS — F250 Schizoaffective disorder, bipolar type: Secondary | ICD-10-CM

## 2018-04-30 DIAGNOSIS — F17210 Nicotine dependence, cigarettes, uncomplicated: Secondary | ICD-10-CM

## 2018-04-30 MED ORDER — HALOPERIDOL 10 MG PO TABS: 10.0000 mg | ORAL_TABLET | Freq: Two times a day (BID) | ORAL | 0 refills | Status: DC

## 2018-04-30 NOTE — BH Assessment (Signed)
[facility] Assessment Progress Note    Per Waylan Boga, NP, Patient can be discharged to follow up with [facility]

## 2018-04-30 NOTE — Consult Note (Addendum)
[facility] ED Discharge  04/30/2018 10:04 AM Brian [name]  MRN:  [id number] Principal Problem: Schizoaffective disorder, bipolar type [facility]) Discharge Diagnoses:  Patient Active Problem List   Diagnosis Date Noted  . Inhalant abuse ([place]) [F18.10]     Priority: High  . Schizoaffective disorder, bipolar type ([place]) [F25.0] 01/12/2014    Priority: High    Class: Acute  . Sprain of left foot [S93.602A] 10/01/2016  . Essential hypertension [I10] 06/12/2015  . Esophageal reflux [K21.9]   . GERD (gastroesophageal reflux disease) [K21.9] 12/23/2014  . Fracture of ischium ([place]) [S32.609A] 12/20/2014    Subjective: 32 yo male who presented to the ED with suicidal ideations after "huffing". Medications were given and today he is clear and coherent.  No suicidal/homicidal ideations, hallucinations, or withdrawal symptoms.  Some attitude during the assessment.  Reports he does take medications from [facility] but huffs daily.  This started a couple of years ago and increases with stress and decreases with lack of money.  0/10 depression today.  Stable for discharge.  Total Time spent with patient: 45 minutes  Past Psychiatric History: schizoaffective disorder  Past Medical History:  Past Medical History:  Diagnosis Date  . Anxiety   . Bipolar 1 disorder ([place])   . Depression   . Hepatitis C   . Hypertension   . PTSD (post-traumatic stress disorder)    Past Surgical History:  Procedure Laterality Date  . HIP SURGERY     Family History:  Family History  Problem Relation Age of Onset  . Hypertension Father   . Stroke Maternal Grandmother   . Diabetes Paternal Grandmother    Family Psychiatric  History: none Social History:  Social History   Substance and Sexual Activity  Alcohol Use Yes   Comment: one beer a day    Social History   Substance and Sexual Activity  Drug Use Not Currently  . Types: Methamphetamines, Heroin, Marijuana, IV, Solvent inhalants   Comment: "huffs" air  canisters    Social History   Socioeconomic History  . Marital status: Divorced    Spouse name: Not on file  . Number of children: Not on file  . Years of education: Not on file  . Highest education level: Not on file  Occupational History  . Not on file  Social Needs  . Financial resource strain: Not on file  . Food insecurity:    Worry: Not on file    Inability: Not on file  . Transportation needs:    Medical: Not on file    Non-medical: Not on file  Tobacco Use  . Smoking status: Current Every Day Smoker    Packs/day: 1.00    Types: Cigarettes  . Smokeless tobacco: Former Network engineer and Sexual Activity  . Alcohol use: Yes    Comment: one beer a day  . Drug use: Not Currently    Types: Methamphetamines, Heroin, Marijuana, IV, Solvent inhalants    Comment: "huffs" air canisters   . Sexual activity: Not Currently  Lifestyle  . Physical activity:    Days per week: Not on file    Minutes per session: Not on file  . Stress: Not on file  Relationships  . Social connections:    Talks on phone: Not on file    Gets together: Not on file    Attends religious service: Not on file    Active member of club or organization: Not on file    Attends meetings of clubs or organizations: Not  on file    Relationship status: Not on file  Other Topics Concern  . Not on file  Social History Narrative  . Not on file    Has this patient used any form of tobacco in the last 30 days? (Cigarettes, Smokeless Tobacco, Cigars, and/or Pipes) A prescription for an FDA-approved tobacco cessation medication was offered at discharge and the patient refused  Current Medications: Current Facility-Administered Medications  Medication Dose Route Frequency Provider Last Rate Last Dose  . amLODipine (NORVASC) tablet 5 mg  5 mg Oral Daily Patrecia Pour, NP   5 mg at 04/30/18 0905  . bacitracin ointment   Topical BID Couture, Cortni S, PA-C   1 application at 21/30/86 5784  . benztropine  (COGENTIN) tablet 0.5 mg  0.5 mg Oral BID Patrecia Pour, NP   0.5 mg at 04/30/18 0904  . divalproex (DEPAKOTE) DR tablet 500 mg  500 mg Oral Q12H Patrecia Pour, NP   500 mg at 04/30/18 6962  . haloperidol (HALDOL) tablet 10 mg  10 mg Oral BID Patrecia Pour, NP   10 mg at 04/30/18 0904  . hydrOXYzine (ATARAX/VISTARIL) tablet 25 mg  25 mg Oral BID PRN Patrecia Pour, NP   25 mg at 04/30/18 0905  . ibuprofen (ADVIL,MOTRIN) tablet 800 mg  800 mg Oral Q6H PRN Couture, Cortni S, PA-C      . LORazepam (ATIVAN) injection 0-4 mg  0-4 mg Intravenous Q6H Couture, Cortni S, PA-C       Or  . LORazepam (ATIVAN) tablet 0-4 mg  0-4 mg Oral Q6H Couture, Cortni S, PA-C   1 mg at 04/29/18 1733  . [START ON 05/02/2018] LORazepam (ATIVAN) injection 0-4 mg  0-4 mg Intravenous Q12H Couture, Cortni S, PA-C       Or  . [START ON 05/02/2018] LORazepam (ATIVAN) tablet 0-4 mg  0-4 mg Oral Q12H Couture, Cortni S, PA-C      . naltrexone (DEPADE) tablet 50 mg  50 mg Oral Daily Couture, Cortni S, PA-C   50 mg at 04/30/18 0903  . pantoprazole (PROTONIX) EC tablet 40 mg  40 mg Oral Daily Couture, Cortni S, PA-C   40 mg at 04/30/18 0904  . thiamine (VITAMIN B-1) tablet 100 mg  100 mg Oral Daily Couture, Cortni S, PA-C   100 mg at 04/30/18 0908   Or  . thiamine (B-1) injection 100 mg  100 mg Intravenous Daily Couture, Cortni S, PA-C       Current Outpatient Medications  Medication Sig Dispense Refill  . acetaminophen (TYLENOL) 500 MG tablet Take 1,000 mg by mouth every 6 (six) hours as needed for mild pain or headache.    Marland Kitchen amLODipine (NORVASC) 5 MG tablet Take 1 tablet (5 mg total) by mouth daily. 90 tablet 3  . benztropine (COGENTIN) 0.5 MG tablet Take 1 tablet (0.5 mg total) by mouth 2 (two) times daily. 60 tablet 0  . diphenhydrAMINE (BENADRYL) 25 MG tablet Take 25 mg by mouth every 6 (six) hours as needed for itching or allergies.    Marland Kitchen divalproex (DEPAKOTE) 500 MG DR tablet Take 1,000 mg by mouth at bedtime.     .  haloperidol (HALDOL) 5 MG tablet Take 1 tablet (5 mg total) by mouth 2 (two) times daily. 60 tablet 0  . hydrOXYzine (ATARAX/VISTARIL) 25 MG tablet Take 25 mg by mouth 2 (two) times daily as needed for anxiety.     Marland Kitchen ibuprofen (ADVIL,MOTRIN) 200 MG tablet  Take 800 mg by mouth every 6 (six) hours as needed for mild pain or moderate pain.    . naltrexone (DEPADE) 50 MG tablet Take 50 mg by mouth daily.    Marland Kitchen omeprazole (PRILOSEC) 20 MG capsule Take 20 mg by mouth daily.    Marland Kitchen OVER THE COUNTER MEDICATION Take 1 Dose by mouth as needed (anxiety).    . QUEtiapine (SEROQUEL) 25 MG tablet Take 25 mg by mouth at bedtime.     Marland Kitchen ibuprofen (ADVIL,MOTRIN) 800 MG tablet Take 1 tablet (800 mg total) by mouth 3 (three) times daily. (Patient not taking: Reported on 10/21/2016) 21 tablet 0  . Olopatadine HCl 0.2 % SOLN Apply 1 drop to eye daily. (Patient not taking: Reported on 04/29/2018) 2.5 mL 1   PTA Medications:  (Not in a hospital admission)  Musculoskeletal: Strength & Muscle Tone: within normal limits Gait & Station: normal Patient leans: N/A  Psychiatric Specialty Exam: Physical Exam  Nursing note and vitals reviewed. Constitutional: He is oriented to person, place, and time. He appears well-developed and well-nourished.  HENT:  Head: Normocephalic.  Neck: Normal range of motion.  Respiratory: Effort normal.  Musculoskeletal: Normal range of motion.  Neurological: He is alert and oriented to person, place, and time.  Psychiatric: His speech is normal and behavior is normal. Judgment and thought content normal. His mood appears anxious. His affect is blunt. Cognition and memory are normal.    Review of Systems  Psychiatric/Behavioral: Positive for substance abuse. The patient is nervous/anxious.   All other systems reviewed and are negative.   Blood pressure 110/71, pulse 69, temperature 98.6 F (37 C), temperature source Oral, resp. rate 15, SpO2 96 %.There is no height or weight on file to  calculate BMI.  General Appearance: Casual  Eye Contact:  Good  Speech:  Clear and Coherent  Volume:  Normal  Mood:  Anxious  Affect:  Congruent  Thought Process:  Coherent and Descriptions of Associations: Intact  Orientation:  Full (Time, Place, and Person)  Thought Content:  WDL and Logical  Suicidal Thoughts:  No  Homicidal Thoughts:  No  Memory:  Immediate;   Good Recent;   Good Remote;   Good  Judgement:  Fair  Insight:  Fair  Psychomotor Activity:  Normal  Concentration:  Concentration: Good and Attention Span: Good  Recall:  [place] of Knowledge:  Good  Language:  Good  Akathisia:  No  Handed:  Right  AIMS (if indicated):     Assets:  Housing Leisure Time Physical Health Resilience Social Support  ADL's:  Intact  Cognition:  WNL  Sleep:        Demographic Factors:  Male and Caucasian  Loss Factors: NA  Historical Factors: NA  Risk Reduction Factors:   Sense of responsibility to family, Living with another person, especially a relative, Positive social support and Positive therapeutic relationship  Continued Clinical Symptoms:  Anxiety, mild  Cognitive Features That Contribute To Risk:  None    Suicide Risk:  Minimal: No identifiable suicidal ideation.  Patients presenting with no risk factors but with morbid ruminations; may be classified as minimal risk based on the severity of the depressive symptoms    Plan Of Care/Follow-up recommendations:  Activity:  as tolerated Diet:  heart healthy diet  Disposition: discharge home Waylan Boga, NP 04/30/2018, 10:04 AM  Patient seen face-to-face for psychiatric evaluation, chart reviewed and case discussed with the physician extender and developed treatment plan. Reviewed the information documented  and agree with the treatment plan. Corena Pilgrim, MD

## 2018-04-30 NOTE — ED Notes (Addendum)
Patient calm and cooperative. Reports that he is "feeling weird" and need to take his medications.

## 2018-05-01 ENCOUNTER — Ambulatory Visit: Payer: Self-pay | Attending: Family Medicine

## 2018-05-01 DIAGNOSIS — E669 Obesity, unspecified: Secondary | ICD-10-CM

## 2018-05-02 ENCOUNTER — Telehealth: Payer: Self-pay

## 2018-05-02 LAB — CMP14+EGFR
ALK PHOS: 67 IU/L (ref 39–117)
ALT: 16 IU/L (ref 0–44)
AST: 26 IU/L (ref 0–40)
Albumin/Globulin Ratio: 2.1 (ref 1.2–2.2)
Albumin: 4 g/dL (ref 3.5–5.5)
BUN/Creatinine Ratio: 6 — ABNORMAL LOW (ref 9–20)
BUN: 5 mg/dL — AB (ref 6–20)
Bilirubin Total: 0.5 mg/dL (ref 0.0–1.2)
CO2: 21 mmol/L (ref 20–29)
CREATININE: 0.78 mg/dL (ref 0.76–1.27)
Calcium: 9.4 mg/dL (ref 8.7–10.2)
Chloride: 95 mmol/L — ABNORMAL LOW (ref 96–106)
GFR calc Af Amer: 136 mL/min/{1.73_m2} (ref >59)
GFR calc non Af Amer: 118 mL/min/{1.73_m2} (ref >59)
GLUCOSE: 76 mg/dL (ref 65–99)
Globulin, Total: 1.9 g/dL (ref 1.5–4.5)
Potassium: 4.3 mmol/L (ref 3.5–5.2)
Sodium: 133 mmol/L — ABNORMAL LOW (ref 134–144)
TOTAL PROTEIN: 5.9 g/dL — AB (ref 6.0–8.5)

## 2018-05-02 LAB — LIPID PANEL
CHOLESTEROL TOTAL: 165 mg/dL (ref 100–199)
Chol/HDL Ratio: 4.3 ratio (ref 0.0–5.0)
HDL: 38 mg/dL — AB (ref >39)
LDL CALC: 107 mg/dL — AB (ref 0–99)
Triglycerides: 98 mg/dL (ref 0–149)
VLDL CHOLESTEROL CAL: 20 mg/dL (ref 5–40)

## 2018-05-02 NOTE — Telephone Encounter (Signed)
CMA attempt to call patient to inform on results.  No answer and left a VM for patient to call back.

## 2018-05-02 NOTE — Telephone Encounter (Signed)
-----   Message from Gildardo Pounds, NP sent at 05/02/2018  8:46 AM EDT ----- Sodium is slightly low. This could be related to one of the medications prescribed by your psychiatrist. We will continue to monitor. Please let your psychiatrist know we are monitoring your sodium levels. They may want to make some adjustments with your medications. It is not critically low at this time. I will retest at your next office visit. Cholesterol levels are somewhat normal however you need to exercise more by working out at least 150 minutes per week; 5 days a week-30 minutes per day. Work on a low fat, heart healthy diet and participate in regular aerobic exercise program  Avoid red meat, fried foods. junk foods, sodas, sugary drinks, unhealthy snacking, alcohol and smoking.  Drink at least 48oz of water per day and monitor your carbohydrate intake daily.

## 2018-05-09 MED FILL — HALOPERIDOL 10 MG TABLET: 10 | 30 days supply | Qty: 60 | Fill #0

## 2018-05-15 MED FILL — NALTREXONE 50 MG TABLET: 50 | 30 days supply | Qty: 30 | Fill #0

## 2018-05-15 MED FILL — QUETIAPINE FUMARATE 25 MG T: 25 | 30 days supply | Qty: 30 | Fill #0

## 2018-05-15 MED FILL — FLUoxetine HCL 10 MG CAPS: 10 | 30 days supply | Qty: 30 | Fill #0

## 2018-05-15 MED FILL — DIVALPROEX SOD ER 500 MG TA: 500 | 30 days supply | Qty: 60 | Fill #0

## 2018-05-15 MED FILL — hydrOXYzine PAMOATE 25 MG C: 25 | 30 days supply | Qty: 90 | Fill #0

## 2018-05-15 MED FILL — BENZTROPINE MES 0.5 MG TAB: 0.5 | 30 days supply | Qty: 60 | Fill #0

## 2018-05-17 MED FILL — OLOPATADINE HCL 0.2% EYE DR: 0.2 | 37 days supply | Qty: 3 | Fill #0

## 2018-05-17 MED FILL — AMLODIPINE BESYLATE 5 MG TA: 5 | 30 days supply | Qty: 30 | Fill #0

## 2018-05-22 ENCOUNTER — Ambulatory Visit: Payer: Self-pay | Admitting: Nurse Practitioner

## 2018-05-23 ENCOUNTER — Ambulatory Visit: Payer: Self-pay | Admitting: Nurse Practitioner

## 2018-05-31 ENCOUNTER — Ambulatory Visit: Payer: Self-pay | Admitting: Nurse Practitioner

## 2018-06-02 ENCOUNTER — Ambulatory Visit: Payer: Self-pay | Attending: Nurse Practitioner | Admitting: Nurse Practitioner

## 2018-06-02 ENCOUNTER — Encounter: Payer: Self-pay | Admitting: Nurse Practitioner

## 2018-06-02 VITALS — BP 131/89 | HR 109 | Temp 98.7°F | Ht 72.0 in | Wt 224.0 lb

## 2018-06-02 DIAGNOSIS — I10 Essential (primary) hypertension: Secondary | ICD-10-CM | POA: Insufficient documentation

## 2018-06-02 DIAGNOSIS — Z888 Allergy status to other drugs, medicaments and biological substances status: Secondary | ICD-10-CM | POA: Insufficient documentation

## 2018-06-02 DIAGNOSIS — F4310 Post-traumatic stress disorder, unspecified: Secondary | ICD-10-CM | POA: Insufficient documentation

## 2018-06-02 DIAGNOSIS — F17210 Nicotine dependence, cigarettes, uncomplicated: Secondary | ICD-10-CM | POA: Insufficient documentation

## 2018-06-02 DIAGNOSIS — Z8249 Family history of ischemic heart disease and other diseases of the circulatory system: Secondary | ICD-10-CM | POA: Insufficient documentation

## 2018-06-02 DIAGNOSIS — Z716 Tobacco abuse counseling: Secondary | ICD-10-CM | POA: Insufficient documentation

## 2018-06-02 DIAGNOSIS — B1920 Unspecified viral hepatitis C without hepatic coma: Secondary | ICD-10-CM | POA: Insufficient documentation

## 2018-06-02 DIAGNOSIS — F17200 Nicotine dependence, unspecified, uncomplicated: Secondary | ICD-10-CM

## 2018-06-02 DIAGNOSIS — Z79899 Other long term (current) drug therapy: Secondary | ICD-10-CM | POA: Insufficient documentation

## 2018-06-02 DIAGNOSIS — F319 Bipolar disorder, unspecified: Secondary | ICD-10-CM | POA: Insufficient documentation

## 2018-06-02 MED ORDER — AMLODIPINE BESYLATE 5 MG PO TABS: 5.0000 mg | ORAL_TABLET | Freq: Every day | ORAL | 3 refills | Status: DC

## 2018-06-02 MED FILL — ?AMLODIPINE BESYLATE 5 MG T: 5 MG | 30 days supply | Qty: 30 | Fill #0

## 2018-06-02 NOTE — Progress Notes (Signed)
Assessment & Plan:  Brian [name] was seen today for follow-up.  Diagnoses and all orders for this visit:  Essential hypertension -     amLODipine (NORVASC) 5 MG tablet; Take 1 tablet (5 mg total) by mouth daily. -     CMP14+EGFR Continue all antihypertensives as prescribed.  Remember to bring in your blood pressure log with you for your follow up appointment.  DASH/Mediterranean Diets are healthier choices for HTN.   Tobacco dependence Brian [name] was counseled on the dangers of tobacco use, and was advised to quit. Reviewed strategies to maximize success, including removing cigarettes and smoking materials from environment, stress management and support of family/friends as well as pharmacological alternatives including: Wellbutrin, Chantix, Nicotine patch, Nicotine gum or lozenges. Smoking cessation support: smoking cessation hotline: 1-800-QUIT-NOW.  Smoking cessation classes are also available through [facility] and [facility]. Call [phone] or visit our website at [email or web address]   A total of 3 minutes was spent on counseling for smoking cessation and Brian [name] is not ready to quit.   Patient has been counseled on age-appropriate routine health concerns for screening and prevention. These are reviewed and up-to-date. Referrals have been placed accordingly. Immunizations are up-to-date or declined.    Subjective:   Chief Complaint  Patient presents with  . Follow-up    Pt. is here for 3 months follow-up. Pt. stated he took his medication and had cracket to eat this morning.    HPI Brian [name] 32 y.o. male presents to office today for follow up to HTN. He is accompanied by his mother who reports he continues to drink alcohol and has not stopped huffing chemicals and aerosols/inhalants. Brian [name] did not deny his mother's accusations today.    CHRONIC HYPERTENSION Disease Monitoring Blood pressure is well controlled today. He does not monitor his blood pressure at home.    Blood pressure range BP Readings from Last 3 Encounters:  06/02/18 131/89  04/30/18 110/71  03/20/18 (!) 132/96    Chest pain: no   Dyspnea: no   Claudication: no  Medication compliance: yes, taking amlodipine 5 mg daily Medication Side Effects  Lightheadedness: no   Urinary frequency: no   Edema: no   Impotence: no  Preventitive Healthcare:  Exercise: no   Diet Pattern: diet: general  Salt Restriction:  no  BP Readings from Last 3 Encounters:  06/02/18 131/89  04/30/18 110/71  03/20/18 (!) 132/96    Review of Systems  Constitutional: Negative for fever, malaise/fatigue and weight loss.  HENT: Negative.  Negative for nosebleeds.   Eyes: Negative.  Negative for blurred vision, double vision and photophobia.  Respiratory: Negative.  Negative for cough and shortness of breath.   Cardiovascular: Negative.  Negative for chest pain, palpitations and leg swelling.  Gastrointestinal: Negative.  Negative for heartburn, nausea and vomiting.  Musculoskeletal: Negative.  Negative for myalgias.  Neurological: Negative.  Negative for dizziness, focal weakness, seizures and headaches.  Psychiatric/Behavioral: Positive for depression, hallucinations, memory loss and substance abuse. Negative for suicidal ideas. The patient is nervous/anxious and has insomnia.     Past Medical History:  Diagnosis Date  . Anxiety   . Bipolar 1 disorder ([place])   . Depression   . Hepatitis C   . Hypertension   . PTSD (post-traumatic stress disorder)     Past Surgical History:  Procedure Laterality Date  . HIP SURGERY      Family History  Problem Relation Age of Onset  . Hypertension Father   . Stroke Maternal Grandmother   .  Diabetes Paternal Grandmother     Social History Reviewed with no changes to be made today.   Outpatient Medications Prior to Visit  Medication Sig Dispense Refill  . benztropine (COGENTIN) 0.5 MG tablet Take 1 tablet (0.5 mg total) by mouth 2 (two) times daily. 60  tablet 0  . divalproex (DEPAKOTE) 500 MG DR tablet Take 1,000 mg by mouth at bedtime.     . haloperidol (HALDOL) 10 MG tablet Take 1 tablet (10 mg total) by mouth 2 (two) times daily. 60 tablet 0  . naltrexone (DEPADE) 50 MG tablet Take 50 mg by mouth daily.    Marland Kitchen omeprazole (PRILOSEC) 20 MG capsule Take 20 mg by mouth daily.    . QUEtiapine (SEROQUEL) 25 MG tablet Take 25 mg by mouth at bedtime.     Marland Kitchen amLODipine (NORVASC) 5 MG tablet Take 1 tablet (5 mg total) by mouth daily. 90 tablet 3  . diphenhydrAMINE (BENADRYL) 25 MG tablet Take 25 mg by mouth every 6 (six) hours as needed for itching or allergies.    Marland Kitchen OVER THE COUNTER MEDICATION Take 1 Dose by mouth as needed (anxiety).     No facility-administered medications prior to visit.     Allergies  Allergen Reactions  . Phenytoin Sodium Extended Other (See Comments)    Gets sick to his stomach, and closes throat up  . Pineapple Anaphylaxis  . Risperdal [Risperidone] Shortness Of Breath    'throat closes up'       Objective:    BP 131/89 (BP Location: Right Arm, Patient Position: Sitting, Cuff Size: Normal)   Pulse (!) 109   Temp 98.7 F (37.1 C) (Oral)   Ht 6' (1.829 m)   Wt 224 lb (101.6 kg)   SpO2 95%   BMI 30.38 kg/m  Wt Readings from Last 3 Encounters:  06/02/18 224 lb (101.6 kg)  03/20/18 226 lb 6.4 oz (102.7 kg)  03/12/18 230 lb (104.3 kg)    Physical Exam  Constitutional: He is oriented to person, place, and time. He appears well-developed and well-nourished. He is cooperative.  HENT:  Head: Normocephalic and atraumatic.  Eyes: EOM are normal.  Neck: Normal range of motion.  Cardiovascular: Regular rhythm and normal heart sounds. Tachycardia present. Exam reveals no gallop and no friction rub.  No murmur heard. Pulmonary/Chest: Effort normal and breath sounds normal. No tachypnea. No respiratory distress. He has no decreased breath sounds. He has no wheezes. He has no rhonchi. He has no rales. He exhibits no  tenderness.  Abdominal: Bowel sounds are normal.  Musculoskeletal: Normal range of motion. He exhibits no edema.  Neurological: He is alert and oriented to person, place, and time. Coordination normal.  Skin: Skin is warm and dry.  Psychiatric: He has a normal mood and affect. Judgment and thought content normal. He is slowed.  Nursing note and vitals reviewed.      Patient has been counseled extensively about nutrition and exercise as well as the importance of adherence with medications and regular follow-up. The patient was given clear instructions to go to ER or return to medical center if symptoms don't improve, worsen or new problems develop. The patient verbalized understanding.   Follow-up: Return in about 3 months (around 09/02/2018).   Gildardo Pounds, FNP-BC [facility] [place], [place]   06/04/2018, 9:23 PM

## 2018-06-03 LAB — CMP14+EGFR
ALT: 11 IU/L (ref 0–44)
AST: 18 IU/L (ref 0–40)
Albumin/Globulin Ratio: 2.6 — ABNORMAL HIGH (ref 1.2–2.2)
Albumin: 4.4 g/dL (ref 3.5–5.5)
Alkaline Phosphatase: 57 IU/L (ref 39–117)
BUN / CREAT RATIO: 5 — AB (ref 9–20)
BUN: 4 mg/dL — AB (ref 6–20)
Bilirubin Total: 0.8 mg/dL (ref 0.0–1.2)
CALCIUM: 9.8 mg/dL (ref 8.7–10.2)
CHLORIDE: 97 mmol/L (ref 96–106)
CO2: 22 mmol/L (ref 20–29)
CREATININE: 0.86 mg/dL (ref 0.76–1.27)
GFR calc Af Amer: 131 mL/min/{1.73_m2} (ref >59)
GFR calc non Af Amer: 113 mL/min/{1.73_m2} (ref >59)
GLOBULIN, TOTAL: 1.7 g/dL (ref 1.5–4.5)
GLUCOSE: 91 mg/dL (ref 65–99)
POTASSIUM: 4.2 mmol/L (ref 3.5–5.2)
SODIUM: 133 mmol/L — AB (ref 134–144)
Total Protein: 6.1 g/dL (ref 6.0–8.5)

## 2018-06-04 ENCOUNTER — Encounter: Payer: Self-pay | Admitting: Nurse Practitioner

## 2018-06-05 MED FILL — HALOPERIDOL 10 MG TABLET: 10 | 30 days supply | Qty: 60 | Fill #0

## 2018-06-07 ENCOUNTER — Telehealth: Payer: Self-pay

## 2018-06-07 NOTE — Telephone Encounter (Signed)
-----   Message from Gildardo Pounds, NP sent at 06/04/2018  9:38 PM EDT ----- Sodium continues to be slightly below normal. It is only mildly decreased. Will continue to monitor. No treatment is required at this time.

## 2018-06-07 NOTE — Telephone Encounter (Signed)
CMA attempt to reach patient to inform on results.  No answer and left a VM for patient to call back.  If patient call back, please inform:  Sodium continues to be slightly below normal. It is only mildly decreased. Will continue to monitor. No treatment is required at this time.

## 2018-06-12 MED FILL — NALTREXONE 50 MG TABLET: 50 | 30 days supply | Qty: 30 | Fill #1

## 2018-06-12 MED FILL — DIVALPROEX SOD ER 500 MG TA: 500 | 30 days supply | Qty: 60 | Fill #1

## 2018-06-12 MED FILL — hydrOXYzine PAMOATE 25 MG C: 25 | 30 days supply | Qty: 90 | Fill #1

## 2018-06-12 MED FILL — BENZTROPINE MES 0.5 MG TAB: 0.5 | 30 days supply | Qty: 60 | Fill #1

## 2018-06-12 MED FILL — QUETIAPINE FUMARATE 25 MG T: 25 | 30 days supply | Qty: 30 | Fill #1

## 2018-06-12 MED FILL — FLUoxetine HCL 10 MG CAPS: 10 | 30 days supply | Qty: 30 | Fill #1

## 2018-07-10 MED FILL — HALOPERIDOL 10 MG TABLET: 10 | 30 days supply | Qty: 60 | Fill #1

## 2018-07-24 MED FILL — DIVALPROEX SOD ER 500 MG TA: 500 | 30 days supply | Qty: 60 | Fill #2

## 2018-07-24 MED FILL — hydrOXYzine PAMOATE 25 MG C: 25 | 30 days supply | Qty: 90 | Fill #2

## 2018-07-24 MED FILL — NALTREXONE 50 MG TABLET: 50 | 30 days supply | Qty: 30 | Fill #2

## 2018-07-24 MED FILL — BENZTROPINE MES 0.5 MG TAB: 0.5 | 30 days supply | Qty: 60 | Fill #2

## 2018-07-24 MED FILL — FLUoxetine HCL 10 MG CAPS: 10 | 30 days supply | Qty: 30 | Fill #2

## 2018-07-24 MED FILL — QUETIAPINE FUMARATE 25 MG T: 25 | 30 days supply | Qty: 30 | Fill #2

## 2018-08-07 MED FILL — HALOPERIDOL 10 MG TABLET: 10 | 30 days supply | Qty: 60 | Fill #2

## 2018-08-21 MED FILL — hydrOXYzine PAMOATE 25 MG C: 25 | 30 days supply | Qty: 90 | Fill #0

## 2018-08-21 MED FILL — NALTREXONE 50 MG TABLET: 50 | 30 days supply | Qty: 30 | Fill #0

## 2018-08-21 MED FILL — FLUoxetine HCL 10 MG CAPS: 10 | 30 days supply | Qty: 30 | Fill #0

## 2018-08-21 MED FILL — BENZTROPINE MES 0.5 MG TAB: 0.5 | 30 days supply | Qty: 60 | Fill #0

## 2018-08-21 MED FILL — QUETIAPINE FUMARATE 25 MG T: 25 | 30 days supply | Qty: 30 | Fill #0

## 2018-08-21 MED FILL — DIVALPROEX SOD ER 500 MG TA: 500 | 30 days supply | Qty: 60 | Fill #0

## 2018-08-30 ENCOUNTER — Other Ambulatory Visit: Payer: Self-pay

## 2018-08-30 ENCOUNTER — Encounter: Payer: Self-pay | Admitting: Nurse Practitioner

## 2018-08-30 ENCOUNTER — Ambulatory Visit: Payer: Medicaid Other | Attending: Nurse Practitioner | Admitting: Nurse Practitioner

## 2018-08-30 VITALS — BP 119/87 | HR 92 | Temp 97.8°F | Ht 72.0 in | Wt 239.2 lb

## 2018-08-30 DIAGNOSIS — Z888 Allergy status to other drugs, medicaments and biological substances status: Secondary | ICD-10-CM | POA: Insufficient documentation

## 2018-08-30 DIAGNOSIS — Z882 Allergy status to sulfonamides status: Secondary | ICD-10-CM | POA: Diagnosis not present

## 2018-08-30 DIAGNOSIS — R1909 Other intra-abdominal and pelvic swelling, mass and lump: Secondary | ICD-10-CM | POA: Diagnosis not present

## 2018-08-30 DIAGNOSIS — Z833 Family history of diabetes mellitus: Secondary | ICD-10-CM | POA: Insufficient documentation

## 2018-08-30 DIAGNOSIS — F329 Major depressive disorder, single episode, unspecified: Secondary | ICD-10-CM | POA: Diagnosis not present

## 2018-08-30 DIAGNOSIS — Z8249 Family history of ischemic heart disease and other diseases of the circulatory system: Secondary | ICD-10-CM | POA: Diagnosis not present

## 2018-08-30 DIAGNOSIS — Z8043 Family history of malignant neoplasm of testis: Secondary | ICD-10-CM | POA: Diagnosis not present

## 2018-08-30 DIAGNOSIS — F250 Schizoaffective disorder, bipolar type: Secondary | ICD-10-CM | POA: Insufficient documentation

## 2018-08-30 DIAGNOSIS — Z5181 Encounter for therapeutic drug level monitoring: Secondary | ICD-10-CM

## 2018-08-30 DIAGNOSIS — F4310 Post-traumatic stress disorder, unspecified: Secondary | ICD-10-CM | POA: Diagnosis not present

## 2018-08-30 DIAGNOSIS — I10 Essential (primary) hypertension: Secondary | ICD-10-CM | POA: Diagnosis not present

## 2018-08-30 DIAGNOSIS — E871 Hypo-osmolality and hyponatremia: Secondary | ICD-10-CM | POA: Insufficient documentation

## 2018-08-30 DIAGNOSIS — Z91018 Allergy to other foods: Secondary | ICD-10-CM | POA: Insufficient documentation

## 2018-08-30 DIAGNOSIS — Z79899 Other long term (current) drug therapy: Secondary | ICD-10-CM | POA: Diagnosis not present

## 2018-08-30 NOTE — Patient Instructions (Signed)
Lymphadenopathy    Lymphadenopathy means that your lymph glands are swollen or larger than normal (enlarged). Lymph glands, also called lymph nodes, are collections of tissue that filter bacteria, viruses, and waste from your bloodstream. They are part of your body's disease-fighting system (immune system), which protects your body from germs.  There may be different causes of lymphadenopathy, depending on where it is in your body. Some types go away on their own. Lymphadenopathy can occur anywhere that you have lymph glands, including these areas:  · Neck (cervical lymphadenopathy).  · Chest (mediastinal lymphadenopathy).  · Lungs (hilar lymphadenopathy).  · Underarms (axillary lymphadenopathy).  · Groin (inguinal lymphadenopathy).  When your immune system responds to germs, infection-fighting cells and fluid build up in your lymph glands. This causes some swelling and enlargement. If the lymph glands do not go back to normal after you have an infection or disease, your health care provider may do tests. These tests help to monitor your condition and find the reason why the glands are still swollen and enlarged.  Follow these instructions at home:  · Get plenty of rest.  · Take over-the-counter and prescription medicines only as told by your health care provider. Your health care provider may recommend over-the-counter medicines for pain.  · If directed, apply heat to swollen lymph glands as often as told by your health care provider. Use the heat source that your health care provider recommends, such as a moist heat pack or a heating pad.  ? Place a towel between your skin and the heat source.  ? Leave the heat on for 20-30 minutes.  ? Remove the heat if your skin turns bright red. This is especially important if you are unable to feel pain, heat, or cold. You may have a greater risk of getting burned.  · Check your affected lymph glands every day for changes. Check other lymph gland areas as told by your health  care provider. Check for changes such as:  ? More swelling.  ? Sudden increase in size.  ? Redness or pain.  ? Hardness.  · Keep all follow-up visits as told by your health care provider. This is important.  Contact a health care provider if you have:  · Swelling that gets worse or spreads to other areas.  · Problems with breathing.  · Lymph glands that:  ? Are still swollen after 2 weeks.  ? Have suddenly gotten bigger.  ? Are red, painful, or hard.  · A fever or chills.  · Fatigue.  · A sore throat.  · Pain in your abdomen.  · Weight loss.  · Night sweats.  Get help right away if you have:  · Fluid leaking from an enlarged lymph gland.  · Severe pain.  · Chest pain.  · Shortness of breath.  Summary  · Lymphadenopathy means that your lymph glands are swollen or larger than normal (enlarged).  · Lymph glands (also called lymph nodes) are collections of tissue that filter bacteria, viruses, and waste from the bloodstream. They are part of your body's disease-fighting system (immune system).  · Lymphadenopathy can occur anywhere that you have lymph glands.  · If your enlarged and swollen lymph glands do not go back to normal after you have an infection or disease, your health care provider may do tests to monitor your condition and find the reason why the glands are still swollen and enlarged.  · Check your affected lymph glands every day for changes. Check other lymph   gland areas as told by your health care provider.  This information is not intended to replace advice given to you by your health care provider. Make sure you discuss any questions you have with your health care provider.  Document Released: 05/18/2008 Document Revised: 06/24/2017 Document Reviewed: 06/24/2017  [employer]vier Interactive Patient Education © 2019 [employer]

## 2018-08-30 NOTE — Progress Notes (Signed)
Assessment & Plan:  Brian [name] was seen today for follow-up.  Diagnoses and all orders for this visit:  Groin lump -     US SCROTUM W/DOPPLER; Future  Essential hypertension Continue all antihypertensives as prescribed.  Remember to bring in your blood pressure log with you for your follow up appointment.  DASH/Mediterranean Diets are healthier choices for HTN.    Hyponatremia -     CMP14+EGFR  Medication monitoring encounter -     Valproic acid level  Schizoaffective disorder, bipolar type (HCC) Chronic. Endorses medication compliance.  Receiving telehealth medicine from [facility]. One of his psychiatric medications includes Depakote.  I will have a valproic acid level drawn today.   Patient has been counseled on age-appropriate routine health concerns for screening and prevention. These are reviewed and up-to-date. Referrals have been placed accordingly. Immunizations are up-to-date or declined.    Subjective:   Chief Complaint  Patient presents with  . Follow-up    HTN   HPI Brian [name] 33 y.o. male presents to office today for follow up to HTN. He is receiving behavioral health services at [facility] with last office visit one month ago.  He is accompanied by his mother today.  He endorses a painless hard lump in the left inguinal area with onset approximately 6 months ago.  Denies any testicular lumps or masses.  He does have a family history (uncle) testicular cancer.  He currently denies any GU symptoms.   CHRONIC HYPERTENSION Disease Monitoring  Blood pressure range BP Readings from Last 3 Encounters:  08/30/18 119/87  06/02/18 131/89  04/30/18 110/71  Blood pressure is well controlled today and he endorses medication compliance taking amlodipine 5 mg daily.  Chest pain: no   Dyspnea: no   Claudication: no  Medication compliance: yes  Medication Side Effects  Lightheadedness: no   Urinary frequency: no   Edema: no   Impotence: no    Preventitive Healthcare:  Exercise: no   Diet Pattern: diet: general  Salt Restriction:  no     Review of Systems  Constitutional: Negative for fever, malaise/fatigue and weight loss.  HENT: Negative.  Negative for nosebleeds.   Eyes: Negative.  Negative for blurred vision, double vision and photophobia.  Respiratory: Negative.  Negative for cough and shortness of breath.   Cardiovascular: Negative.  Negative for chest pain, palpitations and leg swelling.  Gastrointestinal: Negative.  Negative for heartburn, nausea and vomiting.  Genitourinary:       See HPI  Musculoskeletal: Negative.  Negative for myalgias.  Neurological: Negative.  Negative for dizziness, focal weakness, seizures and headaches.  Psychiatric/Behavioral: Positive for hallucinations and substance abuse. Negative for suicidal ideas. The patient is nervous/anxious and has insomnia.        See HPI    Past Medical History:  Diagnosis Date  . Anxiety   . Bipolar 1 disorder ([place])   . Depression   . Hepatitis C   . Hypertension   . PTSD (post-traumatic stress disorder)     Past Surgical History:  Procedure Laterality Date  . HIP SURGERY      Family History  Problem Relation Age of Onset  . Hypertension Father   . Stroke Maternal Grandmother   . Diabetes Paternal Grandmother     Social History Reviewed with no changes to be made today.   Outpatient Medications Prior to Visit  Medication Sig Dispense Refill  . amLODipine (NORVASC) 5 MG tablet Take 1 tablet (5 mg total) by mouth  daily. 90 tablet 3  . benztropine (COGENTIN) 0.5 MG tablet Take 1 tablet (0.5 mg total) by mouth 2 (two) times daily. 60 tablet 0  . divalproex (DEPAKOTE) 500 MG DR tablet Take 1,000 mg by mouth at bedtime.     . haloperidol (HALDOL) 10 MG tablet Take 1 tablet (10 mg total) by mouth 2 (two) times daily. 60 tablet 0  . naltrexone (DEPADE) 50 MG tablet Take 50 mg by mouth daily.    Marland Kitchen omeprazole (PRILOSEC) 20 MG capsule Take 20 mg by  mouth daily.    Marland Kitchen OVER THE COUNTER MEDICATION Take 1 Dose by mouth as needed (anxiety).    . QUEtiapine (SEROQUEL) 25 MG tablet Take 25 mg by mouth at bedtime.     . diphenhydrAMINE (BENADRYL) 25 MG tablet Take 25 mg by mouth every 6 (six) hours as needed for itching or allergies.     No facility-administered medications prior to visit.     Allergies  Allergen Reactions  . Phenytoin Sodium Extended Other (See Comments)    Gets sick to his stomach, and closes throat up  . Pineapple Anaphylaxis  . Risperdal [Risperidone] Shortness Of Breath    'throat closes up'       Objective:    BP 119/87 (BP Location: Right Arm, Patient Position: Sitting, Cuff Size: Large)   Pulse 92   Temp 97.8 F (36.6 C) (Oral)   Ht 6' (1.829 m)   Wt 239 lb 3.2 oz (108.5 kg)   SpO2 96%   BMI 32.44 kg/m  Wt Readings from Last 3 Encounters:  08/30/18 239 lb 3.2 oz (108.5 kg)  06/02/18 224 lb (101.6 kg)  03/20/18 226 lb 6.4 oz (102.7 kg)    Physical Exam Vitals signs and nursing note reviewed. Chaperone present: Mother present.  Constitutional:      Appearance: He is well-developed.  HENT:     Head: Normocephalic and atraumatic.  Neck:     Musculoskeletal: Normal range of motion.  Cardiovascular:     Rate and Rhythm: Normal rate and regular rhythm.     Heart sounds: Normal heart sounds. No murmur. No friction rub. No gallop.   Pulmonary:     Effort: Pulmonary effort is normal. No tachypnea or respiratory distress.     Breath sounds: Normal breath sounds. No decreased breath sounds, wheezing, rhonchi or rales.  Chest:     Chest wall: No tenderness.  Abdominal:     General: Bowel sounds are normal.     Palpations: Abdomen is soft.  Genitourinary:    Penis: Normal.      Scrotum/Testes: Normal.  Musculoskeletal: Normal range of motion.  Lymphadenopathy:     Lower Body: Left inguinal adenopathy present.  Skin:    General: Skin is warm and dry.  Neurological:     Mental Status: He is alert  and oriented to person, place, and time.     Coordination: Coordination normal.  Psychiatric:        Behavior: Behavior normal. Behavior is cooperative.        Thought Content: Thought content normal.        Judgment: Judgment normal.          Patient has been counseled extensively about nutrition and exercise as well as the importance of adherence with medications and regular follow-up. The patient was given clear instructions to go to ER or return to medical center if symptoms don't improve, worsen or new problems develop. The patient verbalized understanding.  Follow-up: Return in about 6 months (around 02/28/2019) for BP recheck physical.   Gildardo Pounds, FNP-BC [facility] [place], [place]   08/30/2018, 12:18 PM

## 2018-08-31 LAB — CMP14+EGFR
ALT: 16 IU/L (ref 0–44)
AST: 17 IU/L (ref 0–40)
Albumin/Globulin Ratio: 2.4 — ABNORMAL HIGH (ref 1.2–2.2)
Albumin: 4.5 g/dL (ref 3.5–5.5)
Alkaline Phosphatase: 61 IU/L (ref 39–117)
BUN/Creatinine Ratio: 4 — ABNORMAL LOW (ref 9–20)
BUN: 4 mg/dL — ABNORMAL LOW (ref 6–20)
Bilirubin Total: 0.7 mg/dL (ref 0.0–1.2)
CO2: 22 mmol/L (ref 20–29)
Calcium: 9.8 mg/dL (ref 8.7–10.2)
Chloride: 95 mmol/L — ABNORMAL LOW (ref 96–106)
Creatinine, Ser: 0.92 mg/dL (ref 0.76–1.27)
GFR calc Af Amer: 125 mL/min/{1.73_m2} (ref >59)
GFR calc non Af Amer: 108 mL/min/{1.73_m2} (ref >59)
Globulin, Total: 1.9 g/dL (ref 1.5–4.5)
Glucose: 73 mg/dL (ref 65–99)
Potassium: 4.5 mmol/L (ref 3.5–5.2)
Sodium: 134 mmol/L (ref 134–144)
Total Protein: 6.4 g/dL (ref 6.0–8.5)

## 2018-08-31 LAB — VALPROIC ACID LEVEL: Valproic Acid Lvl: 86 ug/mL (ref 50–100)

## 2018-09-06 ENCOUNTER — Ambulatory Visit (HOSPITAL_COMMUNITY): Admission: RE | Admit: 2018-09-06 | Payer: Self-pay | Source: Ambulatory Visit

## 2018-09-06 MED FILL — HALOPERIDOL 10 MG TABLET: 10 | 30 days supply | Qty: 90 | Fill #0

## 2018-09-08 ENCOUNTER — Telehealth: Payer: Self-pay | Admitting: *Deleted

## 2018-09-08 NOTE — Telephone Encounter (Signed)
Patients mother called requesting patients lab results, Nurse was not available. Please follow up [phone] p

## 2018-09-08 NOTE — Telephone Encounter (Signed)
Left message on voicemail to return call.   Notes recorded by Nat Christen, CMA on 09/08/2018 at 9:02 AM EST Left voicemail asking patient to return call to clinic. Nat Christen, CMA ------  Notes recorded by Gildardo Pounds, NP on 09/07/2018 at 9:15 PM EST Depakote level is normal. You have a few minimally abnormal labs that are slightly out of the normal range. They do not require any additional work up at this time.

## 2018-09-11 NOTE — Telephone Encounter (Signed)
Pt called for lab results. Pt states he will complete a DPR. Pt also confirmed phone numbers on acct are correct & his mother will always answer when we call because pt cannot hear well. Pt assured he is with his phone now for a return call for lab results.

## 2018-09-11 NOTE — Telephone Encounter (Signed)
Patient mother called to get lab results. Patient mother was informed of no DPR on file to speak to her.   Mother expressed patient will not answer the phone and has no patience to answer calls. Patients mother suggested VM be left detailing what needs to be done for the patient because she says patient will not return call.  Please follow up.

## 2018-09-11 NOTE — Telephone Encounter (Signed)
Attempt to call patient to relay lab results. Left message to return call. NO DPR in chart to speak to mother.

## 2018-09-11 NOTE — Telephone Encounter (Signed)
Name and DOB verified. Pt aware of results. Verbalized understanding. No further questions at this time.

## 2018-09-18 MED FILL — NALTREXONE 50 MG TABLET: 50 | 30 days supply | Qty: 30 | Fill #0

## 2018-09-18 MED FILL — BENZTROPINE MES 0.5 MG TAB: 0.5 | 30 days supply | Qty: 60 | Fill #1

## 2018-09-18 MED FILL — FLUoxetine HCL 10 MG CAPS: 10 | 30 days supply | Qty: 30 | Fill #1

## 2018-09-18 MED FILL — QUETIAPINE FUMARATE 25 MG T: 25 | 30 days supply | Qty: 30 | Fill #0

## 2018-09-20 MED FILL — DIVALPROEX SOD ER 500 MG TA: 500 | 30 days supply | Qty: 60 | Fill #1

## 2018-10-12 MED FILL — DIVALPROEX SOD ER 500 MG TA: 500 | 30 days supply | Qty: 60 | Fill #0

## 2018-10-12 MED FILL — hydrOXYzine PAMOATE 25 MG C: 25 | 30 days supply | Qty: 90 | Fill #0

## 2018-10-12 MED FILL — QUETIAPINE FUMARATE 25 MG T: 25 | 30 days supply | Qty: 30 | Fill #0

## 2018-10-12 MED FILL — BENZTROPINE MES 0.5 MG TAB: 0.5 | 30 days supply | Qty: 60 | Fill #0

## 2018-10-12 MED FILL — NALTREXONE 50 MG TABLET: 50 | 30 days supply | Qty: 30 | Fill #0

## 2018-10-12 MED FILL — FLUoxetine HCL 20 MG CAPS: 20 | 30 days supply | Qty: 30 | Fill #0

## 2018-10-12 MED FILL — HALOPERIDOL 10 MG TABLET: 10 | 30 days supply | Qty: 90 | Fill #0

## 2018-10-19 ENCOUNTER — Ambulatory Visit: Payer: Self-pay | Attending: Nurse Practitioner

## 2018-10-27 ENCOUNTER — Ambulatory Visit (HOSPITAL_COMMUNITY)
Admission: RE | Admit: 2018-10-27 | Discharge: 2018-10-27 | Disposition: A | Discharge status: Home / Self Care | Payer: Medicaid Other | Source: Ambulatory Visit | Attending: Nurse Practitioner | Admitting: Nurse Practitioner

## 2018-10-27 DIAGNOSIS — R1909 Other intra-abdominal and pelvic swelling, mass and lump: Secondary | ICD-10-CM | POA: Diagnosis not present

## 2018-10-30 ENCOUNTER — Telehealth: Payer: Self-pay

## 2018-10-30 NOTE — Telephone Encounter (Signed)
CMA spoke to patient to inform on ultrasound results.  Pt. Verified DOB. Pt. Understood.

## 2018-10-30 NOTE — Telephone Encounter (Signed)
CMA attempt to reach patient to inform on results.  No answer and left a VM for a call back.  

## 2018-10-30 NOTE — Telephone Encounter (Signed)
-----   Message from Gildardo Pounds, NP sent at 10/29/2018  8:34 PM EDT ----- No mass seen on [place]

## 2018-11-07 MED FILL — NALTREXONE 50 MG TABLET: 50 | 30 days supply | Qty: 30 | Fill #1

## 2018-11-07 MED FILL — DIVALPROEX SOD ER 500 MG TA: 500 | 30 days supply | Qty: 60 | Fill #1

## 2018-11-07 MED FILL — BENZTROPINE MES 0.5 MG TAB: 0.5 | 30 days supply | Qty: 60 | Fill #1

## 2018-11-07 MED FILL — HALOPERIDOL 10 MG TABLET: 10 | 30 days supply | Qty: 90 | Fill #1

## 2018-11-07 MED FILL — hydrOXYzine PAMOATE 25 MG C: 25 | 30 days supply | Qty: 90 | Fill #1

## 2018-11-07 MED FILL — QUETIAPINE FUMARATE 25 MG T: 25 | 30 days supply | Qty: 30 | Fill #1

## 2018-11-07 MED FILL — FLUoxetine HCL 20 MG CAPS: 20 | 30 days supply | Qty: 30 | Fill #1

## 2018-12-05 MED FILL — QUETIAPINE FUMARATE 25 MG T: 25 | 30 days supply | Qty: 30 | Fill #2

## 2018-12-05 MED FILL — NALTREXONE 50 MG TABLET: 50 | 30 days supply | Qty: 30 | Fill #2

## 2018-12-05 MED FILL — FLUoxetine HCL 20 MG CAPS: 20 | 30 days supply | Qty: 30 | Fill #2

## 2018-12-05 MED FILL — DIVALPROEX SOD ER 500 MG TA: 500 | 30 days supply | Qty: 60 | Fill #2

## 2018-12-05 MED FILL — BENZTROPINE MES 0.5 MG TAB: 0.5 | 30 days supply | Qty: 60 | Fill #2

## 2018-12-19 ENCOUNTER — Other Ambulatory Visit: Payer: Self-pay | Admitting: Nurse Practitioner

## 2018-12-19 DIAGNOSIS — I10 Essential (primary) hypertension: Secondary | ICD-10-CM

## 2019-01-01 MED FILL — NALTREXONE 50 MG TABLET: 50 | 30 days supply | Qty: 30 | Fill #0

## 2019-01-01 MED FILL — DIVALPROEX SOD ER 500 MG TA: 500 | 30 days supply | Qty: 60 | Fill #0

## 2019-01-01 MED FILL — BENZTROPINE MES 0.5 MG TAB: 0.5 | 30 days supply | Qty: 60 | Fill #0

## 2019-01-01 MED FILL — ?HALOPERIDOL 10MG TABLET: 10 | 30 days supply | Qty: 90 | Fill #0

## 2019-01-01 MED FILL — hydrOXYzine PAMOATE 25 MG C: 25 | 30 days supply | Qty: 90 | Fill #2

## 2019-01-01 MED FILL — QUETIAPINE FUMARATE 25 MG T: 25 | 30 days supply | Qty: 30 | Fill #0

## 2019-01-01 MED FILL — FLUoxetine HCL 20 MG TABS: 20 | 30 days supply | Qty: 30 | Fill #0

## 2019-02-01 MED FILL — FLUoxetine HCL 20 MG TABS: 20 | 30 days supply | Qty: 30 | Fill #1

## 2019-02-01 MED FILL — QUETIAPINE FUMARATE 25 MG T: 25 | 30 days supply | Qty: 30 | Fill #1

## 2019-02-01 MED FILL — DIVALPROEX SOD ER 500 MG TA: 500 | 30 days supply | Qty: 60 | Fill #1

## 2019-02-01 MED FILL — ?HALOPERIDOL 10MG TABLET: 10 | 30 days supply | Qty: 90 | Fill #1

## 2019-02-01 MED FILL — hydrOXYzine PAMOATE 25 MG C: 25 | 30 days supply | Qty: 90 | Fill #0

## 2019-02-01 MED FILL — NALTREXONE 50 MG TABLET: 50 | 30 days supply | Qty: 30 | Fill #1

## 2019-02-01 MED FILL — BENZTROPINE MES 0.5 MG TAB: 0.5 | 30 days supply | Qty: 60 | Fill #1

## 2019-02-09 ENCOUNTER — Ambulatory Visit: Payer: Medicaid Other | Attending: Nurse Practitioner | Admitting: Nurse Practitioner

## 2019-02-09 ENCOUNTER — Encounter: Payer: Self-pay | Admitting: Nurse Practitioner

## 2019-02-09 ENCOUNTER — Other Ambulatory Visit: Payer: Self-pay

## 2019-02-09 DIAGNOSIS — Z8249 Family history of ischemic heart disease and other diseases of the circulatory system: Secondary | ICD-10-CM | POA: Diagnosis not present

## 2019-02-09 DIAGNOSIS — Z8619 Personal history of other infectious and parasitic diseases: Secondary | ICD-10-CM | POA: Insufficient documentation

## 2019-02-09 DIAGNOSIS — I10 Essential (primary) hypertension: Secondary | ICD-10-CM | POA: Diagnosis not present

## 2019-02-09 DIAGNOSIS — Z79899 Other long term (current) drug therapy: Secondary | ICD-10-CM | POA: Insufficient documentation

## 2019-02-09 DIAGNOSIS — F319 Bipolar disorder, unspecified: Secondary | ICD-10-CM | POA: Insufficient documentation

## 2019-02-09 DIAGNOSIS — F17210 Nicotine dependence, cigarettes, uncomplicated: Secondary | ICD-10-CM | POA: Insufficient documentation

## 2019-02-09 DIAGNOSIS — F4310 Post-traumatic stress disorder, unspecified: Secondary | ICD-10-CM | POA: Insufficient documentation

## 2019-02-09 DIAGNOSIS — F17200 Nicotine dependence, unspecified, uncomplicated: Secondary | ICD-10-CM | POA: Diagnosis not present

## 2019-02-09 MED ORDER — BLOOD PRESSURE MONITOR DEVI: 0 refills | Status: DC

## 2019-02-09 NOTE — Progress Notes (Signed)
Virtual Visit via Telephone Note Due to national recommendations of social distancing due to [place] 19, telehealth visit is felt to be most appropriate for this patient at this time.  I discussed the limitations, risks, security and privacy concerns of performing an evaluation and management service by telephone and the availability of in person appointments. I also discussed with the patient that there may be a patient responsible charge related to this service. The patient expressed understanding and agreed to proceed.    I connected with Brian [name] on 02/09/19  at   3:50 PM EDT  EDT by telephone and verified that I am speaking with the correct person using two identifiers.   Consent I discussed the limitations, risks, security and privacy concerns of performing an evaluation and management service by telephone and the availability of in person appointments. I also discussed with the patient that there may be a patient responsible charge related to this service. The patient expressed understanding and agreed to proceed.   Location of Patient: Private Residence   Location of Provider: [place] [facility] [place] participating in Telemedicine visit: Geryl Rankins FNP-BC [place]    History of Present Illness: Telemedicine visit for: Essential Hypertension   Essential Hypertension He does not monitor his blood pressure at home as he does not have BP cuff. Will order one through his insurance. Denies chest pain, shortness of breath, palpitations, lightheadedness, dizziness, headaches or BLE edema. He endorses medication compliance taking amlodipine 5 mg daily.  BP Readings from Last 3 Encounters:  08/30/18 119/87  06/02/18 131/89  04/30/18 110/71    Past Medical History:  Diagnosis Date  . Anxiety   . Bipolar 1 disorder ([place])   . Depression   . Hepatitis C   . Hypertension   . PTSD (post-traumatic stress disorder)     Past  Surgical History:  Procedure Laterality Date  . HIP SURGERY      Family History  Problem Relation Age of Onset  . Hypertension Father   . Stroke Maternal Grandmother   . Diabetes Paternal Grandmother     Social History   Socioeconomic History  . Marital status: Divorced    Spouse name: Not on file  . Number of children: Not on file  . Years of education: Not on file  . Highest education level: Not on file  Occupational History  . Not on file  Social Needs  . Financial resource strain: Not on file  . Food insecurity    Worry: Not on file    Inability: Not on file  . Transportation needs    Medical: Not on file    Non-medical: Not on file  Tobacco Use  . Smoking status: Current Every Day Smoker    Packs/day: 1.00    Types: Cigarettes  . Smokeless tobacco: Former Network engineer and Sexual Activity  . Alcohol use: Yes    Comment: one beer a day  . Drug use: Not Currently    Types: Methamphetamines, Heroin, Marijuana, IV, Solvent inhalants    Comment: "huffs" air canisters   . Sexual activity: Not Currently  Lifestyle  . Physical activity    Days per week: Not on file    Minutes per session: Not on file  . Stress: Not on file  Relationships  . Social Herbalist on phone: Not on file    Gets together: Not on file    Attends religious service: Not  on file    Active member of club or organization: Not on file    Attends meetings of clubs or organizations: Not on file    Relationship status: Not on file  Other Topics Concern  . Not on file  Social History Narrative  . Not on file     Observations/Objective: Awake, alert and oriented x 3   Review of Systems  Constitutional: Negative for fever, malaise/fatigue and weight loss.  HENT: Negative.  Negative for nosebleeds.   Eyes: Negative.  Negative for blurred vision, double vision and photophobia.  Respiratory: Negative.  Negative for cough and shortness of breath.   Cardiovascular: Negative.   Negative for chest pain, palpitations and leg swelling.  Gastrointestinal: Negative.  Negative for heartburn, nausea and vomiting.  Musculoskeletal: Negative.  Negative for myalgias.  Neurological: Negative.  Negative for dizziness, focal weakness, seizures and headaches.  Psychiatric/Behavioral: Positive for depression. Negative for suicidal ideas. The patient is nervous/anxious.        PTSD    Assessment and Plan: Ahmed was evaluated today for medication refill.  Diagnoses and all orders for this visit:  Essential hypertension Continue amlodipine 5 mg daily as prescribed.  Remember to bring in your blood pressure log with you for your follow up appointment.  DASH/Mediterranean Diets are healthier choices for HTN.    Tobacco Dependence Geordan was counseled on the dangers of tobacco use, and was advised to quit. Reviewed strategies to maximize success, including removing cigarettes and smoking materials from environment, stress management and support of family/friends as well as pharmacological alternatives including: Wellbutrin, Chantix, Nicotine patch, Nicotine gum or lozenges. Smoking cessation support: smoking cessation hotline: 1-800-QUIT-NOW.  Smoking cessation classes are also available through [facility]. Call (419)532-3046 or visit our website at [email or web address]   A total of 3 minutes was spent on counseling for smoking cessation and Tamer is not ready to quit.   Follow Up Instructions Return in about 3 months (around 05/12/2019).     I discussed the assessment and treatment plan with the patient. The patient was provided an opportunity to ask questions and all were answered. The patient agreed with the plan and demonstrated an understanding of the instructions.   The patient was advised to call back or seek an in-person evaluation if the symptoms worsen or if the condition fails to improve as anticipated.  I provided 18 minutes of  non-face-to-face time during this encounter including median intraservice time, reviewing previous notes, labs, imaging, medications and explaining diagnosis and management.  Gildardo Pounds, FNP-BC

## 2019-02-12 ENCOUNTER — Other Ambulatory Visit: Payer: Self-pay | Admitting: Nurse Practitioner

## 2019-02-12 DIAGNOSIS — I10 Essential (primary) hypertension: Secondary | ICD-10-CM

## 2019-02-12 MED ORDER — AMLODIPINE BESYLATE 5 MG PO TABS: 5.0000 mg | ORAL_TABLET | Freq: Every day | ORAL | 0 refills | Status: DC

## 2019-02-12 MED FILL — ?AMLODIPINE BESYLATE 5MG TA: 5 | 30 days supply | Qty: 30 | Fill #0

## 2019-03-05 MED FILL — QUETIAPINE FUMARATE 25 MG T: 25 | 30 days supply | Qty: 30 | Fill #2

## 2019-03-05 MED FILL — ?HALOPERIDOL 10MG TABLET: 10 | 30 days supply | Qty: 90 | Fill #2

## 2019-03-05 MED FILL — DIVALPROEX SOD ER 500 MG TA: 500 | 30 days supply | Qty: 60 | Fill #2

## 2019-03-05 MED FILL — FLUoxetine HCL 20 MG TABS: 20 | 30 days supply | Qty: 30 | Fill #2

## 2019-03-05 MED FILL — NALTREXONE 50 MG TABLET: 50 | 30 days supply | Qty: 30 | Fill #2

## 2019-03-05 MED FILL — hydrOXYzine PAMOATE 25 MG C: 25 | 30 days supply | Qty: 90 | Fill #1

## 2019-03-05 MED FILL — BENZTROPINE MES 0.5 MG TAB: 0.5 | 30 days supply | Qty: 60 | Fill #2

## 2019-03-05 MED FILL — ?HALOPERIDOL 10MG TABLET: 10 | 30 days supply | Qty: 90 | Fill #0

## 2019-04-02 MED FILL — ?AMLODIPINE BESYLATE 5MG TA: 5 | 30 days supply | Qty: 30 | Fill #1

## 2019-04-02 MED FILL — QUETIAPINE FUMARATE 25 MG T: 25 | 30 days supply | Qty: 30 | Fill #1

## 2019-04-02 MED FILL — BENZTROPINE MES 0.5 MG TAB: 0.5 | 30 days supply | Qty: 60 | Fill #0

## 2019-04-02 MED FILL — NALTREXONE 50 MG TABLET: 50 | 30 days supply | Qty: 30 | Fill #0

## 2019-04-02 MED FILL — DIVALPROEX SOD ER 500 MG TA: 500 | 30 days supply | Qty: 60 | Fill #0

## 2019-05-01 MED FILL — ?AMLODIPINE BESYLATE 5MG TA: 5 | 30 days supply | Qty: 30 | Fill #2

## 2019-05-01 MED FILL — NALTREXONE 50 MG TABLET: 50 | 30 days supply | Qty: 30 | Fill #1

## 2019-05-01 MED FILL — hydrOXYzine PAMOATE 25 MG C: 25 | 30 days supply | Qty: 90 | Fill #2

## 2019-05-01 MED FILL — ?HALOPERIDOL 10MG TABLET: 10 | 30 days supply | Qty: 90 | Fill #0

## 2019-05-01 MED FILL — DIVALPROEX SOD ER 500 MG TA: 500 | 30 days supply | Qty: 60 | Fill #1

## 2019-05-01 MED FILL — QUETIAPINE FUMARATE 25 MG T: 25 | 30 days supply | Qty: 30 | Fill #2

## 2019-05-01 MED FILL — BENZTROPINE MES 0.5 MG TAB: 0.5 | 30 days supply | Qty: 60 | Fill #1

## 2019-05-02 MED FILL — FLUoxetine HCL 20 MG CAPS: 20 | 30 days supply | Qty: 30 | Fill #0

## 2019-05-14 ENCOUNTER — Ambulatory Visit: Payer: Medicaid Other | Admitting: Nurse Practitioner

## 2019-06-04 MED FILL — NALTREXONE 50 MG TABLET: 50 | 30 days supply | Qty: 30 | Fill #2

## 2019-06-04 MED FILL — FLUoxetine HCL 20 MG CAPS: 20 | 30 days supply | Qty: 30 | Fill #1

## 2019-06-04 MED FILL — BENZTROPINE MES 0.5 MG TAB: 0.5 | 30 days supply | Qty: 60 | Fill #2

## 2019-06-06 ENCOUNTER — Telehealth: Payer: Self-pay | Admitting: Nurse Practitioner

## 2019-06-06 DIAGNOSIS — I10 Essential (primary) hypertension: Secondary | ICD-10-CM

## 2019-06-06 MED FILL — DIVALPROEX SOD ER 500 MG TA: 500 | 30 days supply | Qty: 60 | Fill #2

## 2019-06-06 MED FILL — hydrOXYzine PAMOATE 25 MG C: 25 | 30 days supply | Qty: 90 | Fill #0

## 2019-06-06 NOTE — Telephone Encounter (Signed)
Patient mother would like to know why his BP medication was denied. Informed no DPR on file but a message would be placed so CMA could follow up with patient. Please follow up.

## 2019-06-07 MED ORDER — AMLODIPINE BESYLATE 5 MG PO TABS: 5.0000 mg | ORAL_TABLET | Freq: Every day | ORAL | 0 refills | Status: DC

## 2019-06-07 MED FILL — HALOPERIDOL 10 MG TABLET: 10 | 30 days supply | Qty: 90 | Fill #0

## 2019-06-07 MED FILL — RAMELTEON 8 MG TABS: 8 | 30 days supply | Qty: 30 | Fill #0

## 2019-06-07 NOTE — Telephone Encounter (Signed)
Spoke to patient and inform he needs to keep his upcoming appt. For additional refill for his blood pressure medication.  CMA will give 30days supply to last him until his next appt.   Pt. Understood.

## 2019-06-08 MED FILL — ?AMLODIPINE BESYLATE 5MG TA: 5 | 30 days supply | Qty: 30 | Fill #0

## 2019-06-27 ENCOUNTER — Ambulatory Visit: Payer: Medicaid Other | Attending: Nurse Practitioner | Admitting: Family Medicine

## 2019-06-27 ENCOUNTER — Other Ambulatory Visit: Payer: Self-pay

## 2019-06-27 ENCOUNTER — Encounter: Payer: Self-pay | Admitting: Family Medicine

## 2019-06-27 DIAGNOSIS — I10 Essential (primary) hypertension: Secondary | ICD-10-CM | POA: Diagnosis not present

## 2019-06-27 MED ORDER — AMLODIPINE BESYLATE 10 MG PO TABS: 10.0000 mg | ORAL_TABLET | Freq: Every day | ORAL | 1 refills | Status: DC

## 2019-06-27 MED FILL — ?AMLODIPINE BESYLATE 10 MG: 10 | 30 days supply | Qty: 30 | Fill #0

## 2019-06-27 NOTE — Progress Notes (Signed)
Virtual Visit via Telephone Note  I connected with Brian [name] on 06/27/19 at  4:10 PM EST by telephone and verified that I am speaking with the correct person using two identifiers.   I discussed the limitations, risks, security and privacy concerns of performing an evaluation and management service by telephone and the availability of in person appointments. I also discussed with the patient that there may be a patient responsible charge related to this service. The patient expressed understanding and agreed to proceed.  Patient Location: Home Provider Location: [facility] Others participating in call: call initiated by [place], CMA who then transferred the call   History of Present Illness:        33 yo male who has complaint of checking BP today and his BP is elevated. He has not been checking his BP daily. He is on medications but not sure which one is for his BP but he took his medications earlier today.  He has had a few mild headaches.  He denies any chest pain or palpitations, no shortness of breath or cough.  No dizziness or loss of balance.  No issues with peripheral edema.  As far as he is aware, he has had no issues with the blood pressure medication.   Past Medical History:  Diagnosis Date  . Anxiety   . Bipolar 1 disorder ([place])   . Depression   . Hepatitis C   . Hypertension   . PTSD (post-traumatic stress disorder)     Past Surgical History:  Procedure Laterality Date  . HIP SURGERY      Family History  Problem Relation Age of Onset  . Hypertension Father   . Stroke Maternal Grandmother   . Diabetes Paternal Grandmother     Social History   Tobacco Use  . Smoking status: Current Every Day Smoker    Packs/day: 1.00    Types: Cigarettes  . Smokeless tobacco: Former Network engineer Use Topics  . Alcohol use: Yes    Comment: one beer a day  . Drug use: Not Currently    Types: Methamphetamines, Heroin, Marijuana, IV, Solvent inhalants    Comment:  "huffs" air canisters      Allergies  Allergen Reactions  . Phenytoin Sodium Extended Other (See Comments)    Gets sick to his stomach, and closes throat up  . Pineapple Anaphylaxis  . Risperdal [Risperidone] Shortness Of Breath    'throat closes up'       Observations/Objective: No vital signs or physical exam conducted as visit was done via telephone  Assessment and Plan: 1. Essential hypertension Patient with complaint of elevated blood pressure.  We will have him increase his amlodipine from 5 mg to 10 mg a new prescription provided.  He is also encouraged to rest, remain well-hydrated and follow a low-sodium diet.  Schedule follow-up for blood pressure recheck. - amLODipine (NORVASC) 10 MG tablet; Take 1 tablet (10 mg total) by mouth daily. To lower blood pressure  Dispense: 90 tablet; Refill: 1  Follow Up Instructions:Return in about 4 weeks (around 07/25/2019) for Hypertension-4 to 6-week follow-up.   I discussed the assessment and treatment plan with the patient. The patient was provided an opportunity to ask questions and all were answered. The patient agreed with the plan and demonstrated an understanding of the instructions.   The patient was advised to call back or seek an in-person evaluation if the symptoms worsen or if the condition fails to improve as anticipated.  I provided 8  minutes of non-face-to-face time during this encounter.   Antony Blackbird, MD

## 2019-06-27 NOTE — Progress Notes (Signed)
Patient verified DOB Patient has eaten today. Patient has taken medication today. Patient denies pain at this time. Patient complains of cough for the past week with hot and cold flashes. Patient denies N/V and diarrhea.

## 2019-07-02 MED FILL — RAMELTEON 8 MG TABS: 8 | 30 days supply | Qty: 30 | Fill #1

## 2019-07-02 MED FILL — NALTREXONE 50 MG TABLET: 50 | 30 days supply | Qty: 30 | Fill #0

## 2019-07-02 MED FILL — DIVALPROEX SOD ER 500 MG TA: 500 | 30 days supply | Qty: 60 | Fill #0

## 2019-07-02 MED FILL — BENZTROPINE MES 0.5 MG TAB: 0.5 | 30 days supply | Qty: 60 | Fill #0

## 2019-07-18 ENCOUNTER — Ambulatory Visit: Payer: Medicaid Other | Admitting: Pharmacist

## 2019-07-23 MED FILL — DIVALPROEX SOD ER 500 MG TA: 500 | 30 days supply | Qty: 60 | Fill #1

## 2019-07-23 MED FILL — hydrOXYzine PAMOATE 25 MG C: 25 | 30 days supply | Qty: 90 | Fill #1

## 2019-07-23 MED FILL — NALTREXONE 50 MG TABLET: 50 | 30 days supply | Qty: 30 | Fill #1

## 2019-07-23 MED FILL — ?AMLODIPINE BESYLATE 10 MG: 10 | 30 days supply | Qty: 30 | Fill #1

## 2019-07-23 MED FILL — RAMELTEON 8 MG TABS: 8 | 30 days supply | Qty: 30 | Fill #2

## 2019-07-23 MED FILL — ?HALOPERIDOL 10MG TABLET: 10 | 30 days supply | Qty: 90 | Fill #1

## 2019-07-24 ENCOUNTER — Ambulatory Visit: Payer: Medicaid Other | Admitting: Pharmacist

## 2019-07-27 MED FILL — BENZTROPINE MES 0.5 MG TAB: 0.5 | 30 days supply | Qty: 60 | Fill #1

## 2019-08-02 ENCOUNTER — Encounter: Payer: Self-pay | Admitting: Pharmacist

## 2019-08-02 ENCOUNTER — Ambulatory Visit: Payer: Medicaid Other | Attending: Nurse Practitioner | Admitting: Pharmacist

## 2019-08-02 ENCOUNTER — Other Ambulatory Visit: Payer: Self-pay

## 2019-08-02 VITALS — BP 116/76 | HR 86

## 2019-08-02 DIAGNOSIS — I10 Essential (primary) hypertension: Secondary | ICD-10-CM | POA: Diagnosis not present

## 2019-08-02 DIAGNOSIS — Z79899 Other long term (current) drug therapy: Secondary | ICD-10-CM | POA: Diagnosis not present

## 2019-08-02 MED FILL — FLUoxetine HCL 20 MG CAPS: 20 | 30 days supply | Qty: 30 | Fill #0

## 2019-08-02 NOTE — Patient Instructions (Signed)

## 2019-08-02 NOTE — Progress Notes (Signed)
   S:    PCP: Zelda  Patient arrives in good spirits.    Presents to the clinic for hypertension evaluation, counseling, and management.  Patient was referred and last seen Dr. Chapman Fitch on 06/27/19. Dr. Chapman Fitch increased amlodipine d/t BP being 152/101.    Patient reports adherence with medications.  Current BP Medications include:  Amlodipine 10 mg daily  Dietary habits include: admits to daily consumption of salty foods including fast food and chips; drinks Mt. Dew daily Exercise habits include: lifts at his apartment complex's gym 2-3 days a week; no aerobic exercise Family / Social history:  - FHx: HTN, DM, stroke - Current every day 0.75-1 PPD smoker - Alcohol: reports occasional use  O:  Vitals:   08/02/19 1019  BP: 116/76  Pulse: 86   Home BP readings: reports SBPs in 130s; unable to provide DBP values   Last 3 Office BP readings: BP Readings from Last 3 Encounters:  06/27/19 (!) 152/101  08/30/18 119/87  06/02/18 131/89   BMET    Component Value Date/Time   NA 134 08/30/2018 1145   K 4.5 08/30/2018 1145   CL 95 (L) 08/30/2018 1145   CO2 22 08/30/2018 1145   GLUCOSE 73 08/30/2018 1145   GLUCOSE 101 (H) 04/29/2018 1538   BUN 4 (L) 08/30/2018 1145   CREATININE 0.92 08/30/2018 1145   CREATININE 0.96 06/12/2015 1446   CALCIUM 9.8 08/30/2018 1145   GFRNONAA 108 08/30/2018 1145   GFRNONAA >89 06/12/2015 1446   GFRAA 125 08/30/2018 1145   GFRAA >89 06/12/2015 1446   Renal function: CrCl cannot be calculated (Patient's most recent lab result is older than the maximum 21 days allowed.).  Clinical ASCVD: No  The ASCVD Risk score Mikey Bussing DC Jr., et al., 2013) failed to calculate for the following reasons:   The 2013 ASCVD risk score is only valid for ages 59 to 73   A/P: Hypertension longstanding currently controlled on current medications. BP Goal = <130/80 mmHg. Patient reports adherence to medications.  -Continued amlodipine 10 mg daily.  -Counseled on lifestyle  modifications for blood pressure control including reduced dietary sodium, increased exercise, adequate sleep  Results reviewed and written information provided. Total time in face-to-face counseling 15 minutes.   F/U Clinic Visit with PCP.  Benard Halsted, PharmD, [facility] [phone]

## 2019-08-27 MED FILL — RAMELTEON 8 MG TABS: 8 | 30 days supply | Qty: 30 | Fill #0

## 2019-08-27 MED FILL — DIVALPROEX SOD ER 500 MG TA: 500 | 30 days supply | Qty: 60 | Fill #2

## 2019-08-27 MED FILL — ?HALOPERIDOL 10MG TABLET: 10 | 30 days supply | Qty: 90 | Fill #2

## 2019-08-27 MED FILL — NALTREXONE 50 MG TABLET: 50 | 30 days supply | Qty: 30 | Fill #2

## 2019-08-27 MED FILL — BENZTROPINE MES 0.5 MG TAB: 0.5 | 30 days supply | Qty: 60 | Fill #2

## 2019-08-27 MED FILL — ?AMLODIPINE BESYLATE 10 MG: 10 | 30 days supply | Qty: 30 | Fill #2

## 2019-08-27 MED FILL — hydrOXYzine PAMOATE 25 MG C: 25 | 30 days supply | Qty: 90 | Fill #2

## 2019-09-10 ENCOUNTER — Ambulatory Visit: Payer: Medicaid Other | Attending: Internal Medicine

## 2019-09-10 DIAGNOSIS — Z20822 Contact with and (suspected) exposure to covid-19: Secondary | ICD-10-CM

## 2019-09-11 LAB — NOVEL CORONAVIRUS, NAA: SARS-CoV-2, NAA: NOT DETECTED

## 2019-09-12 ENCOUNTER — Telehealth: Payer: Self-pay | Admitting: General Practice

## 2019-09-12 NOTE — Telephone Encounter (Signed)
Gave patient negative covid test results Patient understood 

## 2019-09-24 MED FILL — NALTREXONE 50 MG TABLET: 50 | 30 days supply | Qty: 30 | Fill #0

## 2019-09-24 MED FILL — hydrOXYzine PAMOATE 25 MG C: 25 | 30 days supply | Qty: 90 | Fill #0

## 2019-09-24 MED FILL — ?DIVALPROEX SOD ER 500 MG T: 500 | 30 days supply | Qty: 60 | Fill #0

## 2019-09-24 MED FILL — BENZTROPINE MES 0.5 MG TAB: 0.5 | 30 days supply | Qty: 60 | Fill #0

## 2019-09-24 MED FILL — AMLODIPINE BESYLATE 10 MG T: 10 | 30 days supply | Qty: 30 | Fill #3

## 2019-09-24 MED FILL — ?HALOPERIDOL 10MG TABLET: 10 | 30 days supply | Qty: 90 | Fill #0

## 2019-10-10 MED FILL — FLUoxetine HCL 20 MG CAPS: 20 | 30 days supply | Qty: 30 | Fill #0

## 2019-10-22 MED FILL — DIVALPROEX SOD ER 500 MG TA: 500 | 30 days supply | Qty: 60 | Fill #1

## 2019-10-22 MED FILL — NALTREXONE 50 MG TABLET: 50 | 30 days supply | Qty: 30 | Fill #1

## 2019-10-22 MED FILL — BENZTROPINE MES 0.5 MG TAB: 0.5 | 30 days supply | Qty: 60 | Fill #1

## 2019-10-22 MED FILL — AMLODIPINE BESYLATE 10 MG T: 10 | 30 days supply | Qty: 30 | Fill #4

## 2019-10-22 MED FILL — HALOPERIDOL 10 MG TABLET: 10 | 30 days supply | Qty: 90 | Fill #1

## 2019-11-19 MED FILL — AMLODIPINE BESYLATE 10 MG T: 10 | 30 days supply | Qty: 30 | Fill #5

## 2019-11-19 MED FILL — DIVALPROEX SOD ER 500 MG TA: 500 | 30 days supply | Qty: 60 | Fill #2

## 2019-11-19 MED FILL — HALOPERIDOL 10 MG TABLET: 10 | 30 days supply | Qty: 90 | Fill #2

## 2019-11-19 MED FILL — NALTREXONE 50 MG TABLET: 50 | 30 days supply | Qty: 30 | Fill #2

## 2019-11-19 MED FILL — BENZTROPINE MES 0.5 MG TAB: 0.5 | 30 days supply | Qty: 60 | Fill #2

## 2019-12-03 MED FILL — FLUoxetine HCL 20 MG CAPS: 20 | 30 days supply | Qty: 30 | Fill #0

## 2019-12-07 ENCOUNTER — Encounter: Payer: Self-pay | Admitting: Family Medicine

## 2019-12-24 ENCOUNTER — Other Ambulatory Visit: Payer: Self-pay | Admitting: Family Medicine

## 2019-12-24 DIAGNOSIS — I10 Essential (primary) hypertension: Secondary | ICD-10-CM

## 2019-12-24 MED FILL — NALTREXONE 50 MG TABLET: 50 | 30 days supply | Qty: 30 | Fill #0

## 2019-12-24 MED FILL — BENZTROPINE MES 0.5 MG TAB: 0.5 | 30 days supply | Qty: 60 | Fill #0

## 2019-12-24 MED FILL — FLUoxetine HCL 20 MG CAPS: 20 | 30 days supply | Qty: 30 | Fill #0

## 2019-12-24 MED FILL — HYDROXYZINE PAM 25 MG CAP: 25 | 30 days supply | Qty: 90 | Fill #0

## 2019-12-24 MED FILL — HALOPERIDOL 10 MG TABLET: 10 | 30 days supply | Qty: 90 | Fill #0

## 2019-12-24 MED FILL — DIVALPROEX SOD ER 500 MG TA: 500 | 30 days supply | Qty: 60 | Fill #0

## 2019-12-26 MED FILL — AMLODIPINE BESYLATE 10 MG T: 10 | 30 days supply | Qty: 30 | Fill #0

## 2020-01-22 ENCOUNTER — Other Ambulatory Visit: Payer: Self-pay | Admitting: Family Medicine

## 2020-01-22 DIAGNOSIS — I10 Essential (primary) hypertension: Secondary | ICD-10-CM

## 2020-01-22 MED FILL — AMLODIPINE BESYLATE 10 MG T: 10 | 30 days supply | Qty: 30 | Fill #0

## 2020-01-22 MED FILL — BENZTROPINE MES 0.5 MG TAB: 0.5 | 30 days supply | Qty: 60 | Fill #0

## 2020-01-22 MED FILL — HYDROXYZINE PAM 25 MG CAP: 25 | 30 days supply | Qty: 90 | Fill #0

## 2020-01-22 MED FILL — DIVALPROEX SOD ER 500 MG TA: 500 | 30 days supply | Qty: 60 | Fill #0

## 2020-01-22 MED FILL — NALTREXONE 50 MG TABLET: 50 | 30 days supply | Qty: 30 | Fill #0

## 2020-01-22 MED FILL — FLUoxetine HCL 20 MG CAPS: 20 | 30 days supply | Qty: 30 | Fill #0

## 2020-01-22 MED FILL — HALOPERIDOL 10 MG TABLET: 10 | 30 days supply | Qty: 90 | Fill #0

## 2020-02-26 ENCOUNTER — Other Ambulatory Visit: Payer: Self-pay | Admitting: Nurse Practitioner

## 2020-02-26 DIAGNOSIS — I10 Essential (primary) hypertension: Secondary | ICD-10-CM

## 2020-02-26 MED FILL — HYDROXYZINE PAM 25 MG CAP: 25 | 30 days supply | Qty: 90 | Fill #1

## 2020-02-26 MED FILL — DIVALPROEX SOD ER 500 MG TA: 500 | 30 days supply | Qty: 60 | Fill #1

## 2020-02-26 MED FILL — HALOPERIDOL 10 MG TABLET: 10 | 30 days supply | Qty: 90 | Fill #1

## 2020-02-26 MED FILL — FLUoxetine HCL 20 MG CAPS: 20 | 30 days supply | Qty: 30 | Fill #1

## 2020-02-26 MED FILL — BENZTROPINE MES 0.5 MG TAB: 0.5 | 30 days supply | Qty: 60 | Fill #1

## 2020-02-26 NOTE — Telephone Encounter (Signed)
Requested medication (s) are due for refill today: yes  Requested medication (s) are on the active medication list:  yes  Last refill:  01/22/2020  Future visit scheduled: no  Notes to clinic:  spoke with patient and she will have patient schedule follow up Would like to have one ore supply sent if possible   Requested Prescriptions  Pending Prescriptions Disp Refills   amLODipine (NORVASC) 10 MG tablet [Pharmacy Med Name: AMLODIPINE BESYLATE 10 MG T 10 Tablet] 30 tablet 0    Sig: TAKE 1 TABLET (10 MG TOTAL) BY MOUTH DAILY. MUST HAVE OFFICE VISIT FOR REFILLS.      Cardiovascular:  Calcium Channel Blockers Failed - 02/26/2020 10:22 AM      Failed - Valid encounter within last 6 months    Recent Outpatient Visits           6 months ago Essential hypertension   [place], Jarome Matin, RPH-CPP   8 months ago Essential hypertension   [place], MD   1 year ago Essential hypertension   [place], Vernia Buff, NP   1 year ago Groin lump   [place], Vernia Buff, NP   1 year ago Essential hypertension   [place], NP              Passed - Last BP in normal range    BP Readings from Last 1 Encounters:  08/02/19 116/76

## 2020-02-29 ENCOUNTER — Telehealth: Payer: Self-pay | Admitting: Nurse Practitioner

## 2020-02-29 MED FILL — AMLODIPINE BESYLATE 10 MG T: 10 | 30 days supply | Qty: 30 | Fill #0

## 2020-02-29 NOTE — Telephone Encounter (Signed)
Copied from [place] [phone]. Topic: General - Other >> Feb 29, 2020  9:58 AM Keene Breath wrote: Reason for CRM: Patient's mom called to request a few BP medication for patient until his appt. On 8/11/  Patient is all out and cannot be without his medication until that appt.  Please advise and call to confirm at [phone]

## 2020-03-04 NOTE — Telephone Encounter (Signed)
Attempt to reach patient to inform he needs to schedule an appt. For blood work. No answer and LVM.

## 2020-03-05 ENCOUNTER — Ambulatory Visit: Payer: Medicaid Other | Attending: Nurse Practitioner

## 2020-03-05 ENCOUNTER — Other Ambulatory Visit: Payer: Self-pay | Admitting: Nurse Practitioner

## 2020-03-05 ENCOUNTER — Other Ambulatory Visit: Payer: Self-pay

## 2020-03-05 DIAGNOSIS — K219 Gastro-esophageal reflux disease without esophagitis: Secondary | ICD-10-CM

## 2020-03-05 DIAGNOSIS — E783 Hyperchylomicronemia: Secondary | ICD-10-CM

## 2020-03-05 DIAGNOSIS — I10 Essential (primary) hypertension: Secondary | ICD-10-CM

## 2020-03-05 MED FILL — NALTREXONE 50 MG TABLET: 50 | 30 days supply | Qty: 30 | Fill #0

## 2020-03-06 LAB — CMP14+EGFR
ALT: 35 IU/L (ref 0–44)
AST: 42 IU/L — ABNORMAL HIGH (ref 0–40)
Albumin/Globulin Ratio: 2.2 (ref 1.2–2.2)
Albumin: 4.6 g/dL (ref 4.0–5.0)
Alkaline Phosphatase: 127 IU/L — ABNORMAL HIGH (ref 48–121)
BUN/Creatinine Ratio: 8 — ABNORMAL LOW (ref 9–20)
BUN: 7 mg/dL (ref 6–20)
Bilirubin Total: 0.4 mg/dL (ref 0.0–1.2)
CO2: 20 mmol/L (ref 20–29)
Calcium: 10.1 mg/dL (ref 8.7–10.2)
Chloride: 100 mmol/L (ref 96–106)
Creatinine, Ser: 0.93 mg/dL (ref 0.76–1.27)
GFR calc Af Amer: 122 mL/min/{1.73_m2} (ref >59)
GFR calc non Af Amer: 105 mL/min/{1.73_m2} (ref >59)
Globulin, Total: 2.1 g/dL (ref 1.5–4.5)
Glucose: 94 mg/dL (ref 65–99)
Potassium: 4.5 mmol/L (ref 3.5–5.2)
Sodium: 137 mmol/L (ref 134–144)
Total Protein: 6.7 g/dL (ref 6.0–8.5)

## 2020-03-06 LAB — CBC
Hematocrit: 45.8 % (ref 37.5–51.0)
Hemoglobin: 15.9 g/dL (ref 13.0–17.7)
MCH: 31.2 pg (ref 26.6–33.0)
MCHC: 34.7 g/dL (ref 31.5–35.7)
MCV: 90 fL (ref 79–97)
Platelets: 307 10*3/uL (ref 150–450)
RBC: 5.1 x10E6/uL (ref 4.14–5.80)
RDW: 12.8 % (ref 11.6–15.4)
WBC: 6.5 10*3/uL (ref 3.4–10.8)

## 2020-03-06 LAB — LIPID PANEL
Chol/HDL Ratio: 5.2 ratio — ABNORMAL HIGH (ref 0.0–5.0)
Cholesterol, Total: 233 mg/dL — ABNORMAL HIGH (ref 100–199)
HDL: 45 mg/dL (ref >39)
LDL Chol Calc (NIH): 136 mg/dL — ABNORMAL HIGH (ref 0–99)
Triglycerides: 292 mg/dL — ABNORMAL HIGH (ref 0–149)
VLDL Cholesterol Cal: 52 mg/dL — ABNORMAL HIGH (ref 5–40)

## 2020-03-06 LAB — TSH: TSH: 1.81 u[IU]/mL (ref 0.450–4.500)

## 2020-03-08 ENCOUNTER — Other Ambulatory Visit: Payer: Self-pay | Admitting: Nurse Practitioner

## 2020-03-08 MED ORDER — ATORVASTATIN CALCIUM 20 MG PO TABS
20.0000 mg | ORAL_TABLET | Freq: Every day | ORAL | 3 refills | Status: DC
Start: 2020-03-08 — End: 2020-04-02

## 2020-03-10 MED FILL — ATORVASTATIN CALCIUM 20 MG: 20 | 90 days supply | Qty: 90 | Fill #0

## 2020-03-26 ENCOUNTER — Ambulatory Visit (HOSPITAL_COMMUNITY): Payer: Self-pay | Admitting: Psychiatry

## 2020-03-31 ENCOUNTER — Other Ambulatory Visit: Payer: Self-pay | Admitting: Family Medicine

## 2020-03-31 DIAGNOSIS — I10 Essential (primary) hypertension: Secondary | ICD-10-CM

## 2020-03-31 MED FILL — AMLODIPINE BESYLATE 10 MG T: 10 | 30 days supply | Qty: 30 | Fill #0

## 2020-03-31 MED FILL — HYDROXYZINE PAM 25 MG CAP: 25 | 30 days supply | Qty: 90 | Fill #2

## 2020-03-31 MED FILL — BENZTROPINE MES 0.5 MG TAB: 0.5 | 30 days supply | Qty: 60 | Fill #2

## 2020-03-31 MED FILL — FLUoxetine HCL 20 MG CAPS: 20 | 30 days supply | Qty: 30 | Fill #2

## 2020-04-02 ENCOUNTER — Encounter: Payer: Self-pay | Admitting: Nurse Practitioner

## 2020-04-02 ENCOUNTER — Other Ambulatory Visit: Payer: Self-pay

## 2020-04-02 ENCOUNTER — Ambulatory Visit: Payer: Medicaid Other | Attending: Nurse Practitioner | Admitting: Nurse Practitioner

## 2020-04-02 VITALS — BP 119/78 | HR 87 | Temp 97.7°F | Ht 72.0 in | Wt 259.0 lb

## 2020-04-02 DIAGNOSIS — F319 Bipolar disorder, unspecified: Secondary | ICD-10-CM | POA: Insufficient documentation

## 2020-04-02 DIAGNOSIS — T7809XA Anaphylactic reaction due to other food products, initial encounter: Secondary | ICD-10-CM | POA: Insufficient documentation

## 2020-04-02 DIAGNOSIS — Z8249 Family history of ischemic heart disease and other diseases of the circulatory system: Secondary | ICD-10-CM | POA: Diagnosis not present

## 2020-04-02 DIAGNOSIS — K219 Gastro-esophageal reflux disease without esophagitis: Secondary | ICD-10-CM | POA: Insufficient documentation

## 2020-04-02 DIAGNOSIS — T43595A Adverse effect of other antipsychotics and neuroleptics, initial encounter: Secondary | ICD-10-CM | POA: Insufficient documentation

## 2020-04-02 DIAGNOSIS — I10 Essential (primary) hypertension: Secondary | ICD-10-CM | POA: Insufficient documentation

## 2020-04-02 DIAGNOSIS — T420X5A Adverse effect of hydantoin derivatives, initial encounter: Secondary | ICD-10-CM | POA: Insufficient documentation

## 2020-04-02 DIAGNOSIS — E782 Mixed hyperlipidemia: Secondary | ICD-10-CM | POA: Diagnosis not present

## 2020-04-02 DIAGNOSIS — Z760 Encounter for issue of repeat prescription: Secondary | ICD-10-CM | POA: Diagnosis not present

## 2020-04-02 DIAGNOSIS — Z888 Allergy status to other drugs, medicaments and biological substances status: Secondary | ICD-10-CM | POA: Diagnosis not present

## 2020-04-02 DIAGNOSIS — Z79899 Other long term (current) drug therapy: Secondary | ICD-10-CM | POA: Insufficient documentation

## 2020-04-02 DIAGNOSIS — E783 Hyperchylomicronemia: Secondary | ICD-10-CM | POA: Diagnosis not present

## 2020-04-02 MED ORDER — AMLODIPINE BESYLATE 10 MG PO TABS: 10.0000 mg | ORAL_TABLET | Freq: Every day | ORAL | 1 refills | Status: DC

## 2020-04-02 MED ORDER — OMEPRAZOLE 20 MG PO CPDR: 20.0000 mg | DELAYED_RELEASE_CAPSULE | Freq: Every day | ORAL | 1 refills | Status: DC

## 2020-04-02 MED ORDER — ATORVASTATIN CALCIUM 20 MG PO TABS: 20.0000 mg | ORAL_TABLET | Freq: Every day | ORAL | 3 refills | Status: DC

## 2020-04-02 MED FILL — DIVALPROEX SOD ER 500 MG TA: 500 | 30 days supply | Qty: 60 | Fill #2

## 2020-04-02 MED FILL — OMEPRAZOLE 20 MG CAP: 20 | 90 days supply | Qty: 90 | Fill #0

## 2020-04-02 MED FILL — ATORVASTATIN CALCIUM 20 MG: 20 | 90 days supply | Qty: 90 | Fill #0

## 2020-04-02 NOTE — Progress Notes (Signed)
Assessment & Plan:  Brian [name] was seen today for medication refill.  Diagnoses and all orders for this visit:  Essential hypertension -     amLODipine (NORVASC) 10 MG tablet; Take 1 tablet (10 mg total) by mouth daily. Must have office visit for refills. Continue all antihypertensives as prescribed.  Remember to bring in your blood pressure log with you for your follow up appointment.  DASH/Mediterranean Diets are healthier choices for HTN.    Mixed hyperglyceridemia -     atorvastatin (LIPITOR) 20 MG tablet; Take 1 tablet (20 mg total) by mouth daily. INSTRUCTIONS: Work on a low fat, heart healthy diet and participate in regular aerobic exercise program by working out at least 150 minutes per week; 5 days a week-30 minutes per day. Avoid red meat/beef/steak,  fried foods. junk foods, sodas, sugary drinks, unhealthy snacking, alcohol and smoking.  Drink at least 80 oz of water per day and monitor your carbohydrate intake daily.   Gastroesophageal reflux disease, unspecified whether esophagitis present -     omeprazole (PRILOSEC) 20 MG capsule; Take 1 capsule (20 mg total) by mouth daily. INSTRUCTIONS: Avoid GERD Triggers: acidic, spicy or fried foods, caffeine, coffee, sodas,  alcohol and chocolate.   Patient has been counseled on age-appropriate routine health concerns for screening and prevention. These are reviewed and up-to-date. Referrals have been placed accordingly. Immunizations are up-to-date or declined.    Subjective:   Chief Complaint  Patient presents with  . Medication Refill    Pt. is here blood pressure medication refill.    HPI Brian [name] 34 y.o. male presents to office today for follow up. He is doing well today. Recently moved to [place] with his mother.   Essential Hypertension Taking amlodipine 10 mg daily as prescribed. Denies chest pain, shortness of breath, palpitations, lightheadedness, dizziness, headaches or BLE edema.  BP Readings from Last 3  Encounters:  04/02/20 119/78  08/02/19 116/76  06/27/19 (!) 152/101   Mixed Hyperlipidemia Not well controlled although he endorses medication adherence taking atorvastatin 20 mg daily.  Denies any statin intolerance or myalgias. Lab Results  Component Value Date   CHOL 233 (H) 03/05/2020   CHOL 165 05/01/2018   CHOL 211 (H) 12/20/2014   Lab Results  Component Value Date   HDL 45 03/05/2020   HDL 38 (L) 05/01/2018   HDL 31 (L) 12/20/2014   Lab Results  Component Value Date   LDLCALC 136 (H) 03/05/2020   LDLCALC 107 (H) 05/01/2018   LDLCALC 151 (H) 12/20/2014   Lab Results  Component Value Date   TRIG 292 (H) 03/05/2020   TRIG 98 05/01/2018   TRIG 146 12/20/2014   Lab Results  Component Value Date   CHOLHDL 5.2 (H) 03/05/2020   CHOLHDL 4.3 05/01/2018   CHOLHDL 6.8 12/20/2014   Review of Systems  Constitutional: Negative for fever, malaise/fatigue and weight loss.  HENT: Negative.  Negative for nosebleeds.   Eyes: Negative.  Negative for blurred vision, double vision and photophobia.  Respiratory: Negative.  Negative for cough and shortness of breath.   Cardiovascular: Negative.  Negative for chest pain, palpitations and leg swelling.  Gastrointestinal: Positive for heartburn. Negative for nausea and vomiting.  Musculoskeletal: Negative.  Negative for myalgias.  Neurological: Negative.  Negative for dizziness, focal weakness, seizures and headaches.  Psychiatric/Behavioral: Negative.  Negative for suicidal ideas.    Past Medical History:  Diagnosis Date  . Anxiety   . Bipolar 1 disorder ([place])   . Depression   .  Hepatitis C   . Hypertension   . PTSD (post-traumatic stress disorder)     Past Surgical History:  Procedure Laterality Date  . HIP SURGERY      Family History  Problem Relation Age of Onset  . Hypertension Father   . Stroke Maternal Grandmother   . Diabetes Paternal Grandmother     Social History Reviewed with no changes to be made today.     Outpatient Medications Prior to Visit  Medication Sig Dispense Refill  . benztropine (COGENTIN) 0.5 MG tablet Take 1 tablet (0.5 mg total) by mouth 2 (two) times daily. 60 tablet 0  . Blood Pressure Monitor DEVI Please provide patient with insurance approved blood pressure monitor 1 Device 0  . divalproex (DEPAKOTE) 500 MG DR tablet Take 1,000 mg by mouth at bedtime.     . haloperidol (HALDOL) 10 MG tablet Take 1 tablet (10 mg total) by mouth 2 (two) times daily. 60 tablet 0  . naltrexone (DEPADE) 50 MG tablet Take 50 mg by mouth daily.    Marland Kitchen OVER THE COUNTER MEDICATION Take 1 Dose by mouth as needed (anxiety).    . QUEtiapine (SEROQUEL) 25 MG tablet Take 25 mg by mouth at bedtime.     Marland Kitchen amLODipine (NORVASC) 10 MG tablet TAKE 1 TABLET (10 MG TOTAL) BY MOUTH DAILY. MUST HAVE OFFICE VISIT FOR REFILLS. 30 tablet 0  . atorvastatin (LIPITOR) 20 MG tablet Take 1 tablet (20 mg total) by mouth daily. 90 tablet 3  . omeprazole (PRILOSEC) 20 MG capsule Take 20 mg by mouth daily.     No facility-administered medications prior to visit.    Allergies  Allergen Reactions  . Phenytoin Sodium Extended Other (See Comments)    Gets sick to his stomach, and closes throat up  . Pineapple Anaphylaxis  . Risperdal [Risperidone] Shortness Of Breath    'throat closes up'       Objective:    BP 119/78 (BP Location: Left Arm, Patient Position: Sitting, Cuff Size: Large)   Pulse 87   Temp 97.7 F (36.5 C) (Temporal)   Ht 6' (1.829 m)   Wt 259 lb (117.5 kg)   SpO2 97%   BMI 35.13 kg/m  Wt Readings from Last 3 Encounters:  04/02/20 259 lb (117.5 kg)  08/30/18 239 lb 3.2 oz (108.5 kg)  06/02/18 224 lb (101.6 kg)    Physical Exam Vitals and nursing note reviewed.  Constitutional:      Appearance: He is well-developed.  HENT:     Head: Normocephalic and atraumatic.  Cardiovascular:     Rate and Rhythm: Normal rate and regular rhythm.     Heart sounds: Normal heart sounds. No murmur heard.  No  friction rub. No gallop.   Pulmonary:     Effort: Pulmonary effort is normal. No tachypnea or respiratory distress.     Breath sounds: Normal breath sounds. No decreased breath sounds, wheezing, rhonchi or rales.  Chest:     Chest wall: No tenderness.  Abdominal:     General: Bowel sounds are normal.     Palpations: Abdomen is soft.  Musculoskeletal:        General: Normal range of motion.     Cervical back: Normal range of motion.  Skin:    General: Skin is warm and dry.  Neurological:     Mental Status: He is alert and oriented to person, place, and time.     Coordination: Coordination normal.  Psychiatric:  Behavior: Behavior normal. Behavior is cooperative.        Thought Content: Thought content normal.        Judgment: Judgment normal.          Patient has been counseled extensively about nutrition and exercise as well as the importance of adherence with medications and regular follow-up. The patient was given clear instructions to go to ER or return to medical center if symptoms don't improve, worsen or new problems develop. The patient verbalized understanding.   Follow-up: Return in about 3 months (around 07/03/2020).   Gildardo Pounds, FNP-BC [facility] [place], [place]   04/02/2020, 5:57 PM

## 2020-04-08 ENCOUNTER — Other Ambulatory Visit: Payer: Self-pay

## 2020-04-08 MED FILL — NALTREXONE 50 MG TABLET: 50 | 30 days supply | Qty: 30 | Fill #0

## 2020-04-08 MED FILL — HALOPERIDOL 10 MG TABLET: 10 | 30 days supply | Qty: 90 | Fill #0

## 2020-04-22 ENCOUNTER — Other Ambulatory Visit: Payer: Self-pay

## 2020-04-22 ENCOUNTER — Ambulatory Visit
Admission: EM | Admit: 2020-04-22 | Discharge: 2020-04-22 | Disposition: A | Discharge status: Home / Self Care | Payer: Medicaid Other

## 2020-04-22 DIAGNOSIS — H1131 Conjunctival hemorrhage, right eye: Secondary | ICD-10-CM | POA: Diagnosis not present

## 2020-04-22 NOTE — Discharge Instructions (Signed)
See the attached handout on subconjunctival hemorrhage.    Follow-up with your eye doctor for a recheck in 1 to 2 days if your symptoms are not improving.    Go to the emergency department if you have acute eye pain, changes in your vision, or other concerning symptoms.

## 2020-04-22 NOTE — ED Provider Notes (Signed)
Brian [name]    CSN: [id number] Arrival date & time: 04/22/20  1022      History   Chief Complaint Chief Complaint  Patient presents with  . Eye Problem    HPI Brian [name] is a 34 y.o. male.   Accompanied by his mother, patient presents with right eye redness x3 days.  No known injury.  No drainage but patient reports mild itching.  He denies eye pain or changes in his vision.  No fever, chills, ear pain, sore throat, cough, shortness of breath, abdominal pain, vomiting, diarrhea, rash, or other symptoms.  The history is provided by the patient and a parent.    Past Medical History:  Diagnosis Date  . Anxiety   . Bipolar 1 disorder ([place])   . Depression   . Hepatitis C   . Hypertension   . PTSD (post-traumatic stress disorder)     Patient Active Problem List   Diagnosis Date Noted  . 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 Sprain of left foot 10/01/2016  . Essential hypertension 06/12/2015  . Esophageal reflux   . GERD (gastroesophageal reflux disease) 12/23/2014  . Fracture of ischium ([place]) 12/20/2014  . Schizoaffective disorder, bipolar type ([place]) 01/12/2014    Class: Acute    Past Surgical History:  Procedure Laterality Date  . HIP SURGERY         Home Medications    Prior to Admission medications   Medication Sig Start Date End Date Taking? Authorizing Provider  amLODipine (NORVASC) 10 MG tablet Take 1 tablet (10 mg total) by mouth daily. Must have office visit for refills. 04/02/20 07/01/20  Gildardo Pounds, NP  atorvastatin (LIPITOR) 20 MG tablet Take 1 tablet (20 mg total) by mouth daily. 04/02/20   Gildardo Pounds, NP  benztropine (COGENTIN) 0.5 MG tablet Take 1 tablet (0.5 mg total) by mouth 2 (two) times daily. 12/24/14   Niel Hummer, NP  Blood Pressure Monitor DEVI Please provide patient with insurance approved blood pressure monitor 02/09/19   Gildardo Pounds, NP  divalproex (DEPAKOTE) 500 MG DR tablet Take 1,000 mg by mouth at bedtime.     [provider]  haloperidol (HALDOL) 10 MG tablet Take 1 tablet (10 mg total) by mouth 2 (two) times daily. 04/30/18   Patrecia Pour, NP  naltrexone (DEPADE) 50 MG tablet Take 50 mg by mouth daily.    [provider]  omeprazole (PRILOSEC) 20 MG capsule Take 1 capsule (20 mg total) by mouth daily. 04/02/20   Gildardo Pounds, NP  OVER THE COUNTER MEDICATION Take 1 Dose by mouth as needed (anxiety).    [provider]  QUEtiapine (SEROQUEL) 25 MG tablet Take 25 mg by mouth at bedtime.     [provider]    Family History Family History  Problem Relation Age of Onset  . Hypertension Father   . Stroke Maternal Grandmother   . Diabetes Paternal Grandmother     Social History Social History   Tobacco Use  . Smoking status: Current Every Day Smoker    Packs/day: 1.00    Types: Cigarettes  . Smokeless tobacco: Former Network engineer  . Vaping Use: Some days  Substance Use Topics  . Alcohol use: Yes    Comment: one beer a day  . Drug use: Not Currently    Types: Methamphetamines, Heroin, Marijuana, IV, Solvent inhalants    Comment: "huffs" air canisters      Allergies  Phenytoin sodium extended, Pineapple, and Risperdal [risperidone]   Review of Systems Review of Systems  Constitutional: Negative for chills and fever.  HENT: Negative for ear pain and sore throat.   Eyes: Positive for redness and itching. Negative for photophobia, pain, discharge and visual disturbance.  Respiratory: Negative for cough and shortness of breath.   Cardiovascular: Negative for chest pain and palpitations.  Gastrointestinal: Negative for abdominal pain and vomiting.  Genitourinary: Negative for dysuria and hematuria.  Musculoskeletal: Negative for arthralgias and back pain.  Skin: Negative for color change and rash.  Neurological: Negative for seizures and syncope.  All other systems reviewed and are negative.    Physical Exam Triage Vital Signs ED Triage  Vitals  Enc Vitals Group     BP 04/22/20 1039 120/85     Pulse Rate 04/22/20 1039 (!) 108     Resp 04/22/20 1039 16     Temp 04/22/20 1039 98.2 F (36.8 C)     Temp src --      SpO2 04/22/20 1039 95 %     Weight --      Height --      Head Circumference --      Peak Flow --      Pain Score 04/22/20 1037 0     Pain Loc --      Pain Edu? --      Excl. in [place]? --    No data found.  Updated Vital Signs BP 120/85   Pulse (!) 108   Temp 98.2 F (36.8 C)   Resp 16   SpO2 95%   Visual Acuity Right Eye Distance: 20/50 Left Eye Distance: 20/40 Bilateral Distance: 20/40  Right Eye Near:   Left Eye Near:    Bilateral Near:     Physical Exam Vitals and nursing note reviewed.  Constitutional:      Appearance: He is well-developed.  HENT:     Head: Normocephalic and atraumatic.     Right Ear: Tympanic membrane normal.     Left Ear: Tympanic membrane normal.     Nose: Nose normal.     Mouth/Throat:     Mouth: Mucous membranes are moist.     Pharynx: Oropharynx is clear.  Eyes:     General: Lids are normal. Vision grossly intact.        Right eye: No discharge.        Left eye: No discharge.     Extraocular Movements: Extraocular movements intact.     Conjunctiva/sclera: Conjunctivae normal.     Pupils: Pupils are equal, round, and reactive to light.      Comments: Conjunctival hemorrhage of right eye.  Cardiovascular:     Rate and Rhythm: Normal rate and regular rhythm.     Heart sounds: No murmur heard.   Pulmonary:     Effort: Pulmonary effort is normal. No respiratory distress.     Breath sounds: Normal breath sounds. No wheezing or rhonchi.  Abdominal:     Palpations: Abdomen is soft.     Tenderness: There is no abdominal tenderness. There is no guarding or rebound.  Musculoskeletal:     Cervical back: Neck supple.  Skin:    General: Skin is warm and dry.     Findings: No rash.  Neurological:     General: No focal deficit present.     Mental Status: He is  alert and oriented to person, place, and time.     Gait: Gait normal.  Psychiatric:        Mood and Affect: Mood normal.        Behavior: Behavior normal.      UC Treatments / Results  Labs (all labs ordered are listed, but only abnormal results are displayed) Labs Reviewed - No data to display  EKG   Radiology No results found.  Procedures Procedures (including critical care time)  Medications Ordered in UC Medications - No data to display  Initial Impression / Assessment and Plan / UC Course  I have reviewed the triage vital signs and the nursing notes.  Pertinent labs & imaging results that were available during my care of the patient were reviewed by me and considered in my medical decision making (see chart for details).   Conjunctival hemorrhage of the right eye.  Education provided.  Instructed patient and his mother to follow-up with his eye care provider in 1 to 2 days if his symptoms are not improving.  Instructed him to go to the ED if he has acute eye pain or changes in his vision or other concerning symptoms.  Patient and his mother agree to plan of care.   Final Clinical Impressions(s) / UC Diagnoses   Final diagnoses:  Conjunctival hemorrhage of right eye     Discharge Instructions     See the attached handout on subconjunctival hemorrhage.    Follow-up with your eye doctor for a recheck in 1 to 2 days if your symptoms are not improving.    Go to the emergency department if you have acute eye pain, changes in your vision, or other concerning symptoms.       ED Prescriptions    None     PDMP not reviewed this encounter.   Sharion Balloon, NP 04/22/20 [phone]

## 2020-04-22 NOTE — ED Triage Notes (Signed)
Patient reports he woke up 3 days ago and his right eye was red. Denies photosensitivity or visual disturbances. Denies pain.

## 2020-04-29 MED FILL — HYDROXYZINE PAM 25 MG CAP: 25 | 30 days supply | Qty: 90 | Fill #0

## 2020-04-29 MED FILL — FLUoxetine HCL 20 MG CAPS: 20 | 30 days supply | Qty: 30 | Fill #0

## 2020-04-29 MED FILL — DIVALPROEX SOD ER 500 MG TA: 500 | 30 days supply | Qty: 60 | Fill #0

## 2020-04-29 MED FILL — BENZTROPINE MES 0.5 MG TAB: 0.5 | 30 days supply | Qty: 60 | Fill #0

## 2020-04-29 MED FILL — AMLODIPINE BESYLATE 10 MG T: 10 | 90 days supply | Qty: 90 | Fill #0

## 2020-05-27 MED FILL — DIVALPROEX SOD ER 500 MG TA: 500 | 30 days supply | Qty: 60 | Fill #1

## 2020-05-27 MED FILL — HALOPERIDOL 10 MG TABLET: 10 | 30 days supply | Qty: 90 | Fill #2

## 2020-05-27 MED FILL — FLUoxetine HCL 20 MG CAPS: 20 | 30 days supply | Qty: 30 | Fill #1

## 2020-05-27 MED FILL — BENZTROPINE MES 0.5 MG TAB: 0.5 | 30 days supply | Qty: 60 | Fill #1

## 2020-06-17 ENCOUNTER — Other Ambulatory Visit: Payer: Self-pay

## 2020-06-17 MED FILL — RAMELTEON 8 MG TABS: 8 | 30 days supply | Qty: 30 | Fill #0

## 2020-06-17 MED FILL — HYDROXYZINE PAM 25 MG CAP: 25 | 30 days supply | Qty: 90 | Fill #0

## 2020-06-17 MED FILL — QUETIAPINE FUMARATE 25 MG T: 25 | 30 days supply | Qty: 30 | Fill #0

## 2020-06-17 MED FILL — NALTREXONE 50 MG TABLET: 50 | 30 days supply | Qty: 30 | Fill #0

## 2020-07-02 ENCOUNTER — Ambulatory Visit: Payer: Medicaid Other | Attending: Nurse Practitioner | Admitting: Nurse Practitioner

## 2020-07-02 ENCOUNTER — Other Ambulatory Visit: Payer: Self-pay

## 2020-07-02 ENCOUNTER — Other Ambulatory Visit: Payer: Self-pay | Admitting: Nurse Practitioner

## 2020-07-02 ENCOUNTER — Encounter: Payer: Self-pay | Admitting: Nurse Practitioner

## 2020-07-02 ENCOUNTER — Other Ambulatory Visit: Payer: Medicaid Other

## 2020-07-02 DIAGNOSIS — Z79899 Other long term (current) drug therapy: Secondary | ICD-10-CM | POA: Diagnosis not present

## 2020-07-02 DIAGNOSIS — Z8249 Family history of ischemic heart disease and other diseases of the circulatory system: Secondary | ICD-10-CM | POA: Diagnosis not present

## 2020-07-02 DIAGNOSIS — I10 Essential (primary) hypertension: Secondary | ICD-10-CM | POA: Diagnosis present

## 2020-07-02 DIAGNOSIS — F17210 Nicotine dependence, cigarettes, uncomplicated: Secondary | ICD-10-CM | POA: Insufficient documentation

## 2020-07-02 DIAGNOSIS — F319 Bipolar disorder, unspecified: Secondary | ICD-10-CM | POA: Diagnosis not present

## 2020-07-02 MED ORDER — AMLODIPINE BESYLATE 10 MG PO TABS: 10.0000 mg | ORAL_TABLET | Freq: Every day | ORAL | 1 refills | Status: DC

## 2020-07-02 MED FILL — FLUoxetine HCL 20 MG CAPS: 20 | 30 days supply | Qty: 30 | Fill #2

## 2020-07-02 MED FILL — HALOPERIDOL 10 MG TABLET: 10 | 30 days supply | Qty: 90 | Fill #0

## 2020-07-02 MED FILL — DIVALPROEX SOD ER 500 MG TA: 500 | 30 days supply | Qty: 60 | Fill #2

## 2020-07-02 MED FILL — BENZTROPINE MES 0.5 MG TAB: 0.5 | 30 days supply | Qty: 60 | Fill #2

## 2020-07-02 NOTE — Progress Notes (Signed)
Virtual Visit via Telephone Note Due to national recommendations of social distancing due to [place] 19, telehealth visit is felt to be most appropriate for this patient at this time.  I discussed the limitations, risks, security and privacy concerns of performing an evaluation and management service by telephone and the availability of in person appointments. I also discussed with the patient that there may be a patient responsible charge related to this service. The patient expressed understanding and agreed to proceed.    I connected with Brian [name] on 07/02/20  at   9:10 AM EST  EDT by telephone and verified that I am speaking with the correct person using two identifiers.   Consent I discussed the limitations, risks, security and privacy concerns of performing an evaluation and management service by telephone and the availability of in person appointments. I also discussed with the patient that there may be a patient responsible charge related to this service. The patient expressed understanding and agreed to proceed.   Location of Patient: Private Residence   Location of Provider: [place] and [place] participating in Telemedicine visit: Geryl Rankins FNP-BC [place]    History of Present Illness: Telemedicine visit for: Follow Up  Essential Hypertension He is doing well today with no questions or concerns.  He does have a blood pressure monitor however he has not checked his blood pressure recently. Denies chest pain, shortness of breath, palpitations, lightheadedness, dizziness, headaches or BLE edema.  BP Readings from Last 3 Encounters:  04/22/20 120/85  04/02/20 119/78  08/02/19 116/76      Past Medical History:  Diagnosis Date   Anxiety    Bipolar 1 disorder ([facility])    Depression    Hepatitis C    Hypertension    PTSD (post-traumatic stress disorder)     Past Surgical History:  Procedure Laterality Date    HIP SURGERY      Family History  Problem Relation Age of Onset   Hypertension Father    Stroke Maternal Grandmother    Diabetes Paternal Grandmother     Social History   Socioeconomic History   Marital status: Divorced    Spouse name: Not on file   Number of children: Not on file   Years of education: Not on file   Highest education level: Not on file  Occupational History   Not on file  Tobacco Use   Smoking status: Current Every Day Smoker    Packs/day: 1.00    Types: Cigarettes   Smokeless tobacco: Former Counsellor Use: Some days  Substance and Sexual Activity   Alcohol use: Yes    Comment: one beer a day   Drug use: Not Currently    Types: Methamphetamines, Heroin, Marijuana, IV, Solvent inhalants    Comment: "huffs" air canisters    Sexual activity: Not Currently  Other Topics Concern   Not on file  Social History Narrative   Not on file   Social Determinants of Health   Financial Resource Strain:    Difficulty of Paying Living Expenses: Not on file  Food Insecurity:    Worried About Charity fundraiser in the Last Year: Not on file   [employer] of Food in the Last Year: Not on file  Transportation Needs:    Lack of Transportation (Medical): Not on file   Lack of Transportation (Non-Medical): Not on file  Physical Activity:    Days  of Exercise per Week: Not on file   Minutes of Exercise per Session: Not on file  Stress:    Feeling of Stress : Not on file  Social Connections:    Frequency of Communication with Friends and Family: Not on file   Frequency of Social Gatherings with Friends and Family: Not on file   Attends Religious Services: Not on file   Active Member of Clubs or Organizations: Not on file   Attends Archivist Meetings: Not on file   Marital Status: Not on file     Observations/Objective: Awake, alert and oriented x 3   Review of Systems  Constitutional: Negative for fever,  malaise/fatigue and weight loss.  HENT: Negative.  Negative for nosebleeds.   Eyes: Negative.  Negative for blurred vision, double vision and photophobia.  Respiratory: Negative.  Negative for cough and shortness of breath.   Cardiovascular: Negative.  Negative for chest pain, palpitations and leg swelling.  Gastrointestinal: Negative.  Negative for heartburn, nausea and vomiting.  Musculoskeletal: Negative.  Negative for myalgias.  Neurological: Negative.  Negative for dizziness, focal weakness, seizures and headaches.  Psychiatric/Behavioral: Negative for suicidal ideas.       Schizoaffective disorder    Assessment and Plan: Lukka was seen today for follow-up.  Diagnoses and all orders for this visit:  Essential hypertension -     amLODipine (NORVASC) 10 MG tablet; Take 1 tablet (10 mg total) by mouth daily. -     Basic metabolic panel Continue all antihypertensives as prescribed.  Remember to bring in your blood pressure log with you for your follow up appointment.  DASH/Mediterranean Diets are healthier choices for HTN.      Follow Up Instructions Return in about 3 months (around 10/02/2020).     I discussed the assessment and treatment plan with the patient. The patient was provided an opportunity to ask questions and all were answered. The patient agreed with the plan and demonstrated an understanding of the instructions.   The patient was advised to call back or seek an in-person evaluation if the symptoms worsen or if the condition fails to improve as anticipated.  I provided 14 minutes of non-face-to-face time during this encounter including median intraservice time, reviewing previous notes, labs, imaging, medications and explaining diagnosis and management.  Gildardo Pounds, FNP-BC

## 2020-07-03 LAB — BASIC METABOLIC PANEL
BUN/Creatinine Ratio: 9 (ref 9–20)
BUN: 10 mg/dL (ref 6–20)
CO2: 22 mmol/L (ref 20–29)
Calcium: 9.8 mg/dL (ref 8.7–10.2)
Chloride: 101 mmol/L (ref 96–106)
Creatinine, Ser: 1.11 mg/dL (ref 0.76–1.27)
GFR calc Af Amer: 98 mL/min/{1.73_m2} (ref >59)
GFR calc non Af Amer: 85 mL/min/{1.73_m2} (ref >59)
Glucose: 91 mg/dL (ref 65–99)
Potassium: 4.7 mmol/L (ref 3.5–5.2)
Sodium: 139 mmol/L (ref 134–144)

## 2020-07-10 MED FILL — NALTREXONE 50 MG TABLET: 50 | 30 days supply | Qty: 30 | Fill #1

## 2020-07-10 MED FILL — RAMELTEON 8 MG TABS: 8 | 30 days supply | Qty: 30 | Fill #1

## 2020-07-10 MED FILL — QUETIAPINE FUMARATE 25 MG T: 25 | 30 days supply | Qty: 30 | Fill #1

## 2020-07-30 MED FILL — BENZTROPINE MES 0.5 MG TAB: 0.5 | 30 days supply | Qty: 60 | Fill #0

## 2020-07-30 MED FILL — FLUoxetine HCL 20 MG CAPS: 20 | 30 days supply | Qty: 30 | Fill #0

## 2020-07-30 MED FILL — DIVALPROEX SOD ER 500 MG TA: 500 | 30 days supply | Qty: 60 | Fill #0

## 2020-07-30 MED FILL — AMLODIPINE BESYLATE 10 MG T: 10 | 30 days supply | Qty: 30 | Fill #0

## 2020-08-08 MED FILL — RAMELTEON 8 MG TABS: 8 | 30 days supply | Qty: 30 | Fill #2

## 2020-08-08 MED FILL — QUETIAPINE FUMARATE 25 MG T: 25 | 30 days supply | Qty: 30 | Fill #2

## 2020-08-08 MED FILL — HALOPERIDOL 10 MG TABLET: 10 | 30 days supply | Qty: 90 | Fill #1

## 2020-08-08 MED FILL — HYDROXYZINE PAM 25 MG CAP: 25 | 30 days supply | Qty: 90 | Fill #1

## 2020-08-08 MED FILL — NALTREXONE 50 MG TABLET: 50 | 30 days supply | Qty: 30 | Fill #2

## 2020-08-12 ENCOUNTER — Other Ambulatory Visit: Payer: Self-pay

## 2020-08-29 MED FILL — AMLODIPINE BESYLATE 10 MG T: 10 | 30 days supply | Qty: 30 | Fill #1

## 2020-08-29 MED FILL — BENZTROPINE MES 0.5 MG TAB: 0.5 | 30 days supply | Qty: 60 | Fill #1

## 2020-08-29 MED FILL — DIVALPROEX SOD ER 500 MG TA: 500 | 30 days supply | Qty: 60 | Fill #1

## 2020-08-29 MED FILL — FLUoxetine HCL 20 MG CAPS: 20 | 30 days supply | Qty: 30 | Fill #0

## 2020-09-01 ENCOUNTER — Encounter: Payer: Self-pay | Admitting: Nurse Practitioner

## 2020-09-01 ENCOUNTER — Other Ambulatory Visit: Payer: Self-pay | Admitting: Nurse Practitioner

## 2020-09-01 ENCOUNTER — Other Ambulatory Visit: Payer: Self-pay

## 2020-09-01 ENCOUNTER — Ambulatory Visit: Payer: Medicaid Other | Attending: Nurse Practitioner | Admitting: Nurse Practitioner

## 2020-09-01 VITALS — BP 111/77 | HR 90 | Temp 99.0°F | Ht 72.0 in | Wt 267.0 lb

## 2020-09-01 DIAGNOSIS — Z79899 Other long term (current) drug therapy: Secondary | ICD-10-CM | POA: Insufficient documentation

## 2020-09-01 DIAGNOSIS — Z7901 Long term (current) use of anticoagulants: Secondary | ICD-10-CM | POA: Diagnosis not present

## 2020-09-01 DIAGNOSIS — W19XXXD Unspecified fall, subsequent encounter: Secondary | ICD-10-CM | POA: Insufficient documentation

## 2020-09-01 DIAGNOSIS — Z8249 Family history of ischemic heart disease and other diseases of the circulatory system: Secondary | ICD-10-CM | POA: Insufficient documentation

## 2020-09-01 DIAGNOSIS — K219 Gastro-esophageal reflux disease without esophagitis: Secondary | ICD-10-CM | POA: Insufficient documentation

## 2020-09-01 DIAGNOSIS — Z7902 Long term (current) use of antithrombotics/antiplatelets: Secondary | ICD-10-CM | POA: Insufficient documentation

## 2020-09-01 DIAGNOSIS — Y9301 Activity, walking, marching and hiking: Secondary | ICD-10-CM | POA: Insufficient documentation

## 2020-09-01 DIAGNOSIS — S20211D Contusion of right front wall of thorax, subsequent encounter: Secondary | ICD-10-CM | POA: Insufficient documentation

## 2020-09-01 DIAGNOSIS — Z0130 Encounter for examination of blood pressure without abnormal findings: Secondary | ICD-10-CM | POA: Diagnosis not present

## 2020-09-01 DIAGNOSIS — M533 Sacrococcygeal disorders, not elsewhere classified: Secondary | ICD-10-CM

## 2020-09-01 DIAGNOSIS — F4310 Post-traumatic stress disorder, unspecified: Secondary | ICD-10-CM | POA: Diagnosis not present

## 2020-09-01 DIAGNOSIS — I10 Essential (primary) hypertension: Secondary | ICD-10-CM | POA: Diagnosis not present

## 2020-09-01 DIAGNOSIS — F319 Bipolar disorder, unspecified: Secondary | ICD-10-CM | POA: Insufficient documentation

## 2020-09-01 MED ORDER — MELOXICAM 7.5 MG PO TABS: 7.5000 mg | ORAL_TABLET | Freq: Every day | ORAL | 0 refills | Status: DC

## 2020-09-01 MED ORDER — OMEPRAZOLE 20 MG PO CPDR: 20.0000 mg | DELAYED_RELEASE_CAPSULE | Freq: Every day | ORAL | 1 refills | Status: DC

## 2020-09-01 MED FILL — MELOXICAM 7.5 MG TABLET: 7.5 | 30 days supply | Qty: 30 | Fill #0

## 2020-09-01 MED FILL — OMEPRAZOLE 20 MG CAP: 20 | 90 days supply | Qty: 90 | Fill #0

## 2020-09-01 NOTE — Progress Notes (Signed)
Assessment & Plan:  Brian [name] was seen today for blood pressure check.  Diagnoses and all orders for this visit:  Essential hypertension Continue all antihypertensives as prescribed.  Remember to bring in your blood pressure log with you for your follow up appointment.  DASH/Mediterranean Diets are healthier choices for HTN.    Gastroesophageal reflux disease, unspecified whether esophagitis present -     omeprazole (PRILOSEC) 20 MG capsule; Take 1 capsule (20 mg total) by mouth daily. INSTRUCTIONS: Avoid GERD Triggers: acidic, spicy or fried foods, caffeine, coffee, sodas,  alcohol and chocolate.  Fall, subsequent encounter -     meloxicam (MOBIC) 7.5 MG tablet; Take 1 tablet (7.5 mg total) by mouth daily. -     DG Sacrum/Coccyx; Future  Sacral back pain -     meloxicam (MOBIC) 7.5 MG tablet; Take 1 tablet (7.5 mg total) by mouth daily. Take with food.     Patient has been counseled on age-appropriate routine health concerns for screening and prevention. These are reviewed and up-to-date. Referrals have been placed accordingly. Immunizations are up-to-date or declined.    Subjective:   Chief Complaint  Patient presents with  . Blood Pressure Check    Pt. Is here for blood pressure check.    HPI Brian [name] 35 y.o. male presents to office today for follow up.  has a past medical history of Anxiety, Bipolar 1 disorder ([place]), Depression, Hepatitis C, Hypertension, and PTSD (post-traumatic stress disorder).   Doing well today aside from right sided rib cage pain and sacral pain.  States he fell a few weeks ago while walking in the his room in the dark trying to get to another room. Not sure what his chest hit when he landed. Now with bruising to the right chest. Denies shortness of breath.   In regard to the sacral pain he fell a few years ago on his buttocks and believes he was told he had a coccyx fracture but does not recall any imaging being performed. Pain is 6/10 and  somewhat improved with using his mother's meloxicam. Pain is worse with sitting.   Essential Hypertension Well controlled with amlodipine 10 mg daily. Denies chest pain, shortness of breath, palpitations, lightheadedness, dizziness, headaches or BLE edema.  BP Readings from Last 3 Encounters:  09/01/20 111/77  04/22/20 120/85  04/02/20 119/78     Review of Systems  Constitutional: Negative for fever, malaise/fatigue and weight loss.  HENT: Negative.  Negative for nosebleeds.   Eyes: Negative.  Negative for blurred vision, double vision and photophobia.  Respiratory: Negative.  Negative for cough and shortness of breath.   Cardiovascular: Negative.  Negative for chest pain, palpitations and leg swelling.  Gastrointestinal: Positive for heartburn. Negative for nausea and vomiting.  Musculoskeletal: Positive for back pain and joint pain. Negative for myalgias.  Neurological: Negative.  Negative for dizziness, focal weakness, seizures and headaches.  Psychiatric/Behavioral: Negative.  Negative for suicidal ideas.    Past Medical History:  Diagnosis Date  . Anxiety   . Bipolar 1 disorder ([place])   . Depression   . Hepatitis C   . Hypertension   . PTSD (post-traumatic stress disorder)     Past Surgical History:  Procedure Laterality Date  . HIP SURGERY      Family History  Problem Relation Age of Onset  . Hypertension Father   . Stroke Maternal Grandmother   . Diabetes Paternal Grandmother     Social History Reviewed with no changes to be made today.  Outpatient Medications Prior to Visit  Medication Sig Dispense Refill  . amLODipine (NORVASC) 10 MG tablet Take 1 tablet (10 mg total) by mouth daily. 90 tablet 1  . atorvastatin (LIPITOR) 20 MG tablet Take 1 tablet (20 mg total) by mouth daily. 90 tablet 3  . benztropine (COGENTIN) 0.5 MG tablet Take 1 tablet (0.5 mg total) by mouth 2 (two) times daily. 60 tablet 0  . Blood Pressure Monitor DEVI Please provide patient with  insurance approved blood pressure monitor 1 Device 0  . divalproex (DEPAKOTE) 500 MG DR tablet Take 1,000 mg by mouth at bedtime.     . haloperidol (HALDOL) 10 MG tablet Take 1 tablet (10 mg total) by mouth 2 (two) times daily. 60 tablet 0  . naltrexone (DEPADE) 50 MG tablet Take 50 mg by mouth daily.    . QUEtiapine (SEROQUEL) 25 MG tablet Take 25 mg by mouth at bedtime.     Marland Kitchen omeprazole (PRILOSEC) 20 MG capsule Take 1 capsule (20 mg total) by mouth daily. 90 capsule 1  . OVER THE COUNTER MEDICATION Take 1 Dose by mouth as needed (anxiety). (Patient not taking: Reported on 09/01/2020)     No facility-administered medications prior to visit.    Allergies  Allergen Reactions  . Phenytoin Sodium Extended Other (See Comments)    Gets sick to his stomach, and closes throat up  . Pineapple Anaphylaxis  . Risperdal [Risperidone] Shortness Of Breath    'throat closes up'       Objective:    BP 111/77 (BP Location: Right Arm, Patient Position: Sitting, Cuff Size: Large)   Pulse 90   Temp 99 F (37.2 C) (Oral)   Ht 6' (1.829 m)   Wt 267 lb (121.1 kg)   SpO2 96%   BMI 36.21 kg/m  Wt Readings from Last 3 Encounters:  09/01/20 267 lb (121.1 kg)  04/02/20 259 lb (117.5 kg)  08/30/18 239 lb 3.2 oz (108.5 kg)    Physical Exam Vitals and nursing note reviewed.  Constitutional:      Appearance: He is well-developed and well-nourished.  HENT:     Head: Normocephalic and atraumatic.  Eyes:     Extraocular Movements: EOM normal.  Cardiovascular:     Rate and Rhythm: Normal rate and regular rhythm.     Pulses: Intact distal pulses.     Heart sounds: Normal heart sounds. No murmur heard. No friction rub. No gallop.   Pulmonary:     Effort: Pulmonary effort is normal. No tachypnea or respiratory distress.     Breath sounds: Normal breath sounds. No decreased breath sounds, wheezing, rhonchi or rales.  Chest:     Chest wall: No tenderness.    Abdominal:     General: Bowel sounds are  normal.     Palpations: Abdomen is soft.  Musculoskeletal:        General: No edema. Normal range of motion.     Cervical back: Normal range of motion.       Legs:  Skin:    General: Skin is warm and dry.  Neurological:     Mental Status: He is alert and oriented to person, place, and time.     Coordination: Coordination normal.  Psychiatric:        Mood and Affect: Mood and affect normal.        Behavior: Behavior normal. Behavior is cooperative.        Thought Content: Thought content normal.  Judgment: Judgment normal.          Patient has been counseled extensively about nutrition and exercise as well as the importance of adherence with medications and regular follow-up. The patient was given clear instructions to go to ER or return to medical center if symptoms don't improve, worsen or new problems develop. The patient verbalized understanding.   Follow-up: Return in about 3 months (around 11/30/2020).   Gildardo Pounds, FNP-BC [facility] and [facility], [place]   09/01/2020, 12:26 PM

## 2020-09-10 MED FILL — NALTREXONE 50 MG TABLET: 50 | 30 days supply | Qty: 30 | Fill #0

## 2020-09-10 MED FILL — QUETIAPINE FUMARATE 25 MG T: 25 | 30 days supply | Qty: 30 | Fill #0

## 2020-09-29 MED FILL — FLUoxetine HCL 20 MG CAPS: 20 | 30 days supply | Qty: 30 | Fill #1

## 2020-09-29 MED FILL — HYDROXYZINE PAM 25 MG CAP: 25 | 30 days supply | Qty: 90 | Fill #2

## 2020-09-29 MED FILL — AMLODIPINE BESYLATE 10 MG T: 10 | 30 days supply | Qty: 30 | Fill #2

## 2020-09-29 MED FILL — HALOPERIDOL 10 MG TABLET: 10 | 30 days supply | Qty: 90 | Fill #2

## 2020-09-29 MED FILL — BENZTROPINE MES 0.5 MG TAB: 0.5 | 30 days supply | Qty: 60 | Fill #2

## 2020-09-29 MED FILL — DIVALPROEX SOD ER 500 MG TA: 500 | 30 days supply | Qty: 60 | Fill #2

## 2020-10-13 MED FILL — NALTREXONE 50 MG TABLET: 50 | 30 days supply | Qty: 30 | Fill #1

## 2020-10-13 MED FILL — QUETIAPINE FUMARATE 25 MG T: 25 | 30 days supply | Qty: 30 | Fill #1

## 2020-10-27 ENCOUNTER — Other Ambulatory Visit: Payer: Self-pay

## 2020-10-28 MED FILL — DIVALPROEX SOD ER 500 MG TA: 500 | 30 days supply | Qty: 60 | Fill #0

## 2020-10-28 MED FILL — HALOPERIDOL 10 MG TABLET: 10 | 30 days supply | Qty: 90 | Fill #0

## 2020-10-28 MED FILL — HYDROXYZINE PAM 25 MG CAP: 25 | 30 days supply | Qty: 90 | Fill #0

## 2020-10-28 MED FILL — FLUoxetine HCL 20 MG CAPS: 20 | 30 days supply | Qty: 30 | Fill #0

## 2020-10-28 MED FILL — BENZTROPINE MES 0.5 MG TAB: 0.5 | 30 days supply | Qty: 60 | Fill #0

## 2020-11-18 ENCOUNTER — Other Ambulatory Visit: Payer: Self-pay

## 2020-11-18 MED FILL — PRAZOSIN 1 MG CAPSULE: 1 | 30 days supply | Qty: 30 | Fill #0

## 2020-11-25 ENCOUNTER — Other Ambulatory Visit: Payer: Self-pay

## 2020-11-25 MED FILL — Ramelteon Tab 8 MG: ORAL | 30 days supply | Qty: 30 | Fill #0 | Status: AC

## 2020-11-28 ENCOUNTER — Other Ambulatory Visit: Payer: Self-pay

## 2020-11-28 MED FILL — Amlodipine Besylate Tab 10 MG (Base Equivalent): ORAL | 60 days supply | Qty: 60 | Fill #0 | Status: AC

## 2020-11-28 MED FILL — Omeprazole Cap Delayed Release 20 MG: ORAL | 90 days supply | Qty: 90 | Fill #0 | Status: AC

## 2020-12-01 ENCOUNTER — Encounter: Payer: Self-pay | Admitting: Nurse Practitioner

## 2020-12-01 ENCOUNTER — Ambulatory Visit: Payer: Medicaid Other | Attending: Nurse Practitioner | Admitting: Nurse Practitioner

## 2020-12-01 ENCOUNTER — Other Ambulatory Visit: Payer: Self-pay

## 2020-12-01 VITALS — BP 131/88 | HR 103 | Resp 20 | Ht 71.0 in | Wt 259.2 lb

## 2020-12-01 DIAGNOSIS — M533 Sacrococcygeal disorders, not elsewhere classified: Secondary | ICD-10-CM | POA: Diagnosis not present

## 2020-12-01 DIAGNOSIS — F4310 Post-traumatic stress disorder, unspecified: Secondary | ICD-10-CM | POA: Diagnosis not present

## 2020-12-01 DIAGNOSIS — I10 Essential (primary) hypertension: Secondary | ICD-10-CM | POA: Diagnosis not present

## 2020-12-01 DIAGNOSIS — F419 Anxiety disorder, unspecified: Secondary | ICD-10-CM | POA: Diagnosis not present

## 2020-12-01 DIAGNOSIS — F250 Schizoaffective disorder, bipolar type: Secondary | ICD-10-CM

## 2020-12-01 DIAGNOSIS — E783 Hyperchylomicronemia: Secondary | ICD-10-CM | POA: Diagnosis not present

## 2020-12-01 DIAGNOSIS — Z79899 Other long term (current) drug therapy: Secondary | ICD-10-CM | POA: Insufficient documentation

## 2020-12-01 MED ORDER — CYCLOBENZAPRINE HCL 5 MG PO TABS
5.0000 mg | ORAL_TABLET | Freq: Three times a day (TID) | ORAL | 1 refills | Status: DC | PRN
  Filled 2020-12-01: qty 30, 10d supply, fill #0
  Filled 2021-01-05: qty 30, 10d supply, fill #1

## 2020-12-01 MED ORDER — DIVALPROEX SODIUM ER 500 MG PO TB24
ORAL_TABLET | ORAL | 2 refills | Status: DC
  Filled 2020-12-01: qty 60, 30d supply, fill #0
  Filled 2020-12-22 – 2020-12-29 (×2): qty 60, 30d supply, fill #1

## 2020-12-01 MED FILL — Fluoxetine HCl Cap 20 MG: ORAL | 30 days supply | Qty: 30 | Fill #0 | Status: CN

## 2020-12-01 NOTE — Progress Notes (Signed)
Assessment & Plan:  Brian [name] was seen today for 3 month follow up .  Diagnoses and all orders for this visit:  Essential hypertension Continue all antihypertensives as prescribed.  Remember to bring in your blood pressure log with you for your follow up appointment.  DASH/Mediterranean Diets are healthier choices for HTN.    Sacral back pain -     DG Sacrum/Coccyx; Future -     cyclobenzaprine (FLEXERIL) 5 MG tablet; Take 1 tablet (5 mg total) by mouth 3 (three) times daily as needed for muscle spasms.  Schizoaffective disorder, bipolar type (HCC) -     divalproex (DEPAKOTE ER) 500 MG 24 hr tablet; TAKE 2 TABLET BY ORAL ROUTE PER AT BEDTIME    Patient has been counseled on age-appropriate routine health concerns for screening and prevention. These are reviewed and up-to-date. Referrals have been placed accordingly. Immunizations are up-to-date or declined.    Subjective:   Chief Complaint  Patient presents with  . 3 Month follow up    HPI Brian [name] 35 y.o. male presents to office today for follow up.  has a past medical history of Anxiety, Bipolar 1 disorder, Depression, Hepatitis C, Hypertension, and PTSD  HTN Well controlled with amlodipine 10 mg daily as prescribed. Denies chest pain, shortness of breath, palpitations, lightheadedness, dizziness, headaches or BLE edema.  BP Readings from Last 3 Encounters:  12/01/20 131/88  09/01/20 111/77  04/22/20 120/85   Sacral Back pain He fell a few years ago on his buttocks and believes he was told he had a coccyx fracture but does not recall any imaging being performed. Pain is 6/10 and somewhat improved with using his mother's meloxicam. Pain is worse with sitting. I ordered an xray in January however he still has not had this completed.   Review of Systems  Constitutional: Negative for fever, malaise/fatigue and weight loss.  HENT: Negative.  Negative for nosebleeds.   Eyes: Negative.  Negative for blurred vision, double  vision and photophobia.  Respiratory: Negative.  Negative for cough and shortness of breath.   Cardiovascular: Negative.  Negative for chest pain, palpitations and leg swelling.  Gastrointestinal: Negative.  Negative for heartburn, nausea and vomiting.  Musculoskeletal: Positive for back pain. Negative for myalgias.  Neurological: Negative.  Negative for dizziness, focal weakness, seizures and headaches.  Psychiatric/Behavioral: Positive for depression and memory loss. Negative for suicidal ideas. The patient is nervous/anxious.     Past Medical History:  Diagnosis Date  . Anxiety   . Bipolar 1 disorder ([place])   . Depression   . Hepatitis C   . Hypertension   . PTSD (post-traumatic stress disorder)     Past Surgical History:  Procedure Laterality Date  . HIP SURGERY      Family History  Problem Relation Age of Onset  . Hypertension Father   . Stroke Maternal Grandmother   . Diabetes Paternal Grandmother     Social History Reviewed with no changes to be made today.   Outpatient Medications Prior to Visit  Medication Sig Dispense Refill  . meloxicam (MOBIC) 7.5 MG tablet TAKE 1 TABLET (7.5 MG TOTAL) BY MOUTH DAILY. 30 tablet 0  . naltrexone (DEPADE) 50 MG tablet TAKE 1 TABLET BY ORAL ROUTE 1 TIME PER DAY 30 tablet 2  . omeprazole (PRILOSEC) 20 MG capsule TAKE 1 CAPSULE (20 MG TOTAL) BY MOUTH DAILY. 90 capsule 1  . prazosin (MINIPRESS) 1 MG capsule TAKE 1 CAPSULE BY ORAL ROUTE PER AT BEDTIME 30 capsule  2  . QUEtiapine (SEROQUEL) 25 MG tablet TAKE 1 TABLET BY ORAL ROUTE PER AT BEDTIME AS NEEDED FOR SLEEP 30 tablet 2  . ramelteon (ROZEREM) 8 MG tablet TAKE 1 TABLET BY ORAL ROUTE 1 TIME PER DAY AT BEDTIME 30 tablet 2  . FLUoxetine (PROZAC) 20 MG capsule TAKE 1 CAPSULE BY MOUTH DAILY. 30 capsule 2  . haloperidol (HALDOL) 10 MG tablet TAKE 1 TABLET BY ORAL ROUTE 3 TIMES PER DAY 90 tablet 2  . hydrOXYzine (VISTARIL) 25 MG capsule TAKE 1 CAPSULE BY ORAL ROUTE 3 TIMES PER DAY AS NEEDED  90 capsule 0  . amLODipine (NORVASC) 10 MG tablet TAKE 1 TABLET (10 MG TOTAL) BY MOUTH DAILY. 90 tablet 1  . atorvastatin (LIPITOR) 20 MG tablet Take 1 tablet (20 mg total) by mouth daily. 90 tablet 3  . benztropine (COGENTIN) 0.5 MG tablet Take 1 tablet (0.5 mg total) by mouth 2 (two) times daily. (Patient not taking: Reported on 12/01/2020) 60 tablet 0  . Blood Pressure Monitor DEVI Please provide patient with insurance approved blood pressure monitor 1 Device 0  . FLUoxetine (PROZAC) 20 MG capsule TAKE 1 CAPSULE BY MOUTH ONCE DAILY (Patient not taking: Reported on 12/01/2020) 30 capsule 2  . haloperidol (HALDOL) 10 MG tablet TAKE 1 TABLET BY MOUTH THREE TIMES DAILY (Patient not taking: Reported on 12/01/2020) 90 tablet 2  . hydrOXYzine (VISTARIL) 25 MG capsule TAKE 1 CAPSULE BY ORAL ROUTE 3 TIMES PER DAY AS NEEDED (Patient not taking: Reported on 12/01/2020) 90 capsule 2  . OVER THE COUNTER MEDICATION Take 1 Dose by mouth as needed (anxiety). (Patient not taking: Reported on 09/01/2020)    . QUEtiapine (SEROQUEL) 25 MG tablet Take 25 mg by mouth at bedtime.  (Patient not taking: Reported on 12/01/2020)    . benztropine (COGENTIN) 0.5 MG tablet TAKE 1 TABLET BY ORAL ROUTE 2 TIMES PER DAY 60 tablet 2  . benztropine (COGENTIN) 0.5 MG tablet TAKE 1 TABLET BY ORAL ROUTE 2 TIMES PER DAY (Patient not taking: Reported on 12/01/2020) 60 tablet 0  . benztropine (COGENTIN) 0.5 MG tablet TAKE 1 TABLET BY ORAL ROUTE 2 TIMES PER DAY 60 tablet 2  . benztropine (COGENTIN) 0.5 MG tablet TAKE 1 TABLET BY MOUTH TWICE DAILY (Patient not taking: Reported on 12/01/2020) 60 tablet 2  . divalproex (DEPAKOTE ER) 500 MG 24 hr tablet TAKE 2 TABLET BY ORAL ROUTE PER AT BEDTIME 60 tablet 2  . divalproex (DEPAKOTE ER) 500 MG 24 hr tablet TAKE 2 TABLET BY ORAL ROUTE PER AT BEDTIME (Patient not taking: Reported on 12/01/2020) 60 tablet 0  . divalproex (DEPAKOTE ER) 500 MG 24 hr tablet TAKE 2 TABLETS BY MOUTH AT BEDTIME (Patient not  taking: Reported on 12/01/2020) 60 tablet 2  . divalproex (DEPAKOTE ER) 500 MG 24 hr tablet TAKE 2 TABLETS BY MOUTH AT BEDTIME. (Patient not taking: Reported on 12/01/2020) 60 tablet 2  . divalproex (DEPAKOTE) 500 MG DR tablet Take 1,000 mg by mouth at bedtime.  (Patient not taking: Reported on 12/01/2020)    . FLUoxetine (PROZAC) 20 MG capsule TAKE 1 CAPSULE BY ORAL ROUTE 1 TIME PER DAY (Patient not taking: Reported on 12/01/2020) 30 capsule 2  . FLUoxetine (PROZAC) 20 MG capsule `AKE 1 CAPSULE BY ORAL ROUTE 1 TIME PER DAY 30 capsule 0  . haloperidol (HALDOL) 10 MG tablet Take 1 tablet (10 mg total) by mouth 2 (two) times daily. (Patient not taking: Reported on 12/01/2020) 60 tablet 0  .  haloperidol (HALDOL) 10 MG tablet TAKE 1 TABLET BY ORAL ROUTE 3 TIMES PER DAY (Patient not taking: Reported on 12/01/2020) 90 tablet 0  . hydrOXYzine (VISTARIL) 25 MG capsule TAKE 1 CAPSULE BY ORAL ROUTE 3 TIMES PER DAY AS NEEDED (Patient not taking: Reported on 12/01/2020) 90 capsule 2  . naltrexone (DEPADE) 50 MG tablet Take 50 mg by mouth daily. (Patient not taking: Reported on 12/01/2020)    . QUEtiapine (SEROQUEL) 25 MG tablet TAKE 1 TABLET BY ORAL ROUTE PER AT BEDTIME AS NEEDED FOR SLEEP (Patient not taking: Reported on 12/01/2020) 30 tablet 0  . QUEtiapine (SEROQUEL) 25 MG tablet TAKE 1 TABLET BY MOUTH AT BEDTIME AS NEEDED FOR SLEEP (Patient not taking: Reported on 12/01/2020) 30 tablet 2  . QUEtiapine (SEROQUEL) 25 MG tablet TAKE 1 TABLET BY ORAL ROUTE PER AT BEDTIME AS NEEDED FOR SLEEP (Patient not taking: Reported on 12/01/2020) 30 tablet 2  . ramelteon (ROZEREM) 8 MG tablet TAKE 1 TABLET BY ORAL ROUTE 1 TIME PER DAY AT BEDTIME (Patient not taking: Reported on 12/01/2020) 30 tablet 0  . ramelteon (ROZEREM) 8 MG tablet TAKE 1 TABLET BY ORAL ROUTE 1 TIME PER DAY AT BEDTIME (Patient not taking: Reported on 12/01/2020) 30 tablet 2   No facility-administered medications prior to visit.    Allergies  Allergen Reactions   . Phenytoin Sodium Extended Other (See Comments)    Gets sick to his stomach, and closes throat up  . Pineapple Anaphylaxis  . Risperdal [Risperidone] Shortness Of Breath    'throat closes up'       Objective:    BP 131/88   Pulse (!) 103   Resp 20   Ht 5\' 11"  (1.803 m)   Wt 259 lb 3.2 oz (117.6 kg)   SpO2 95%   BMI 36.15 kg/m  Wt Readings from Last 3 Encounters:  12/01/20 259 lb 3.2 oz (117.6 kg)  09/01/20 267 lb (121.1 kg)  04/02/20 259 lb (117.5 kg)    Physical Exam Vitals and nursing note reviewed.  Constitutional:      Appearance: He is well-developed.  HENT:     Head: Normocephalic and atraumatic.  Cardiovascular:     Rate and Rhythm: Normal rate and regular rhythm.     Heart sounds: Normal heart sounds. No murmur heard. No friction rub. No gallop.   Pulmonary:     Effort: Pulmonary effort is normal. No tachypnea or respiratory distress.     Breath sounds: Normal breath sounds. No decreased breath sounds, wheezing, rhonchi or rales.  Chest:     Chest wall: No tenderness.  Abdominal:     General: Bowel sounds are normal.     Palpations: Abdomen is soft.  Musculoskeletal:        General: Normal range of motion.     Cervical back: Normal range of motion.  Skin:    General: Skin is warm and dry.  Neurological:     Mental Status: He is alert and oriented to person, place, and time.     Coordination: Coordination normal.  Psychiatric:        Behavior: Behavior normal. Behavior is cooperative.        Thought Content: Thought content normal.        Judgment: Judgment normal.          Patient has been counseled extensively about nutrition and exercise as well as the importance of adherence with medications and regular follow-up. The patient was given clear instructions to go  to ER or return to medical center if symptoms don't improve, worsen or new problems develop. The patient verbalized understanding.   Follow-up: Return for FASTING labs and Physical.    Gildardo Pounds, FNP-BC [facility] and [facility], [place]   12/04/2020, 5:05 PM

## 2020-12-03 ENCOUNTER — Other Ambulatory Visit: Payer: Self-pay

## 2020-12-03 ENCOUNTER — Ambulatory Visit: Payer: Medicaid Other | Attending: Nurse Practitioner

## 2020-12-03 ENCOUNTER — Other Ambulatory Visit: Payer: Self-pay | Admitting: Nurse Practitioner

## 2020-12-03 DIAGNOSIS — E783 Hyperchylomicronemia: Secondary | ICD-10-CM

## 2020-12-03 DIAGNOSIS — K219 Gastro-esophageal reflux disease without esophagitis: Secondary | ICD-10-CM

## 2020-12-03 DIAGNOSIS — I10 Essential (primary) hypertension: Secondary | ICD-10-CM

## 2020-12-04 ENCOUNTER — Encounter: Payer: Self-pay | Admitting: Nurse Practitioner

## 2020-12-04 LAB — CMP14+EGFR
ALT: 57 IU/L — ABNORMAL HIGH (ref 0–44)
AST: 39 IU/L (ref 0–40)
Albumin/Globulin Ratio: 2 (ref 1.2–2.2)
Albumin: 4.4 g/dL (ref 4.0–5.0)
Alkaline Phosphatase: 102 IU/L (ref 44–121)
BUN/Creatinine Ratio: 4 — ABNORMAL LOW (ref 9–20)
BUN: 4 mg/dL — ABNORMAL LOW (ref 6–20)
Bilirubin Total: 0.5 mg/dL (ref 0.0–1.2)
CO2: 22 mmol/L (ref 20–29)
Calcium: 9.5 mg/dL (ref 8.7–10.2)
Chloride: 101 mmol/L (ref 96–106)
Creatinine, Ser: 1.02 mg/dL (ref 0.76–1.27)
Globulin, Total: 2.2 g/dL (ref 1.5–4.5)
Glucose: 84 mg/dL (ref 65–99)
Potassium: 4.3 mmol/L (ref 3.5–5.2)
Sodium: 140 mmol/L (ref 134–144)
Total Protein: 6.6 g/dL (ref 6.0–8.5)
eGFR: 98 mL/min/{1.73_m2} (ref >59)

## 2020-12-04 LAB — LIPID PANEL
Chol/HDL Ratio: 5.7 ratio — ABNORMAL HIGH (ref 0.0–5.0)
Cholesterol, Total: 235 mg/dL — ABNORMAL HIGH (ref 100–199)
HDL: 41 mg/dL (ref >39)
LDL Chol Calc (NIH): 145 mg/dL — ABNORMAL HIGH (ref 0–99)
Triglycerides: 271 mg/dL — ABNORMAL HIGH (ref 0–149)
VLDL Cholesterol Cal: 49 mg/dL — ABNORMAL HIGH (ref 5–40)

## 2020-12-04 LAB — CBC
Hematocrit: 44.4 % (ref 37.5–51.0)
Hemoglobin: 15 g/dL (ref 13.0–17.7)
MCH: 31.6 pg (ref 26.6–33.0)
MCHC: 33.8 g/dL (ref 31.5–35.7)
MCV: 94 fL (ref 79–97)
Platelets: 260 10*3/uL (ref 150–450)
RBC: 4.74 x10E6/uL (ref 4.14–5.80)
RDW: 11.9 % (ref 11.6–15.4)
WBC: 4.6 10*3/uL (ref 3.4–10.8)

## 2020-12-04 MED ORDER — ATORVASTATIN CALCIUM 20 MG PO TABS
20.0000 mg | ORAL_TABLET | Freq: Every day | ORAL | 3 refills | Status: DC
  Filled 2020-12-04: qty 90, 90d supply, fill #0

## 2020-12-08 ENCOUNTER — Other Ambulatory Visit: Payer: Self-pay

## 2020-12-08 ENCOUNTER — Other Ambulatory Visit: Payer: Self-pay | Admitting: Pharmacy Technician

## 2020-12-08 MED FILL — Fluoxetine HCl Cap 20 MG: ORAL | 30 days supply | Qty: 30 | Fill #0 | Status: AC

## 2020-12-08 MED FILL — Prazosin HCl Cap 1 MG: ORAL | 30 days supply | Qty: 30 | Fill #0 | Status: CN

## 2020-12-08 MED FILL — Quetiapine Fumarate Tab 25 MG: ORAL | 30 days supply | Qty: 30 | Fill #0 | Status: AC

## 2020-12-11 ENCOUNTER — Other Ambulatory Visit: Payer: Self-pay

## 2020-12-11 ENCOUNTER — Emergency Department: Payer: Medicaid Other

## 2020-12-11 ENCOUNTER — Emergency Department
Admission: EM | Admit: 2020-12-11 | Discharge: 2020-12-11 | Disposition: A | Discharge status: Home / Self Care | Payer: Medicaid Other | Attending: Physician Assistant | Admitting: Physician Assistant

## 2020-12-11 DIAGNOSIS — L089 Local infection of the skin and subcutaneous tissue, unspecified: Secondary | ICD-10-CM | POA: Insufficient documentation

## 2020-12-11 DIAGNOSIS — F17210 Nicotine dependence, cigarettes, uncomplicated: Secondary | ICD-10-CM | POA: Insufficient documentation

## 2020-12-11 DIAGNOSIS — I10 Essential (primary) hypertension: Secondary | ICD-10-CM | POA: Diagnosis not present

## 2020-12-11 DIAGNOSIS — B9689 Other specified bacterial agents as the cause of diseases classified elsewhere: Secondary | ICD-10-CM

## 2020-12-11 DIAGNOSIS — R1905 Periumbilic swelling, mass or lump: Secondary | ICD-10-CM | POA: Diagnosis present

## 2020-12-11 DIAGNOSIS — L0291 Cutaneous abscess, unspecified: Secondary | ICD-10-CM | POA: Diagnosis not present

## 2020-12-11 DIAGNOSIS — Z79899 Other long term (current) drug therapy: Secondary | ICD-10-CM | POA: Insufficient documentation

## 2020-12-11 DIAGNOSIS — K429 Umbilical hernia without obstruction or gangrene: Secondary | ICD-10-CM | POA: Diagnosis not present

## 2020-12-11 MED ORDER — OXYCODONE-ACETAMINOPHEN 7.5-325 MG PO TABS
1.0000 | ORAL_TABLET | Freq: Four times a day (QID) | ORAL | 0 refills | Status: DC | PRN
  Filled 2020-12-11: qty 12, 3d supply, fill #0

## 2020-12-11 MED ORDER — NEOSPORIN PLUS PAIN RELIEF MS 3.5-10000-10 EX CREA: TOPICAL_CREAM | Freq: Two times a day (BID) | CUTANEOUS | 0 refills | Status: DC

## 2020-12-11 MED ORDER — OXYCODONE-ACETAMINOPHEN 5-325 MG PO TABS
1.0000 | ORAL_TABLET | Freq: Once | ORAL | Status: AC
  Administered 2020-12-11: 1 via ORAL
  Filled 2020-12-11: qty 1

## 2020-12-11 MED ORDER — OXYCODONE-ACETAMINOPHEN 7.5-325 MG PO TABS: 1.0000 | ORAL_TABLET | Freq: Four times a day (QID) | ORAL | 0 refills | Status: AC | PRN

## 2020-12-11 MED ORDER — NEOSPORIN PLUS PAIN RELIEF MS 3.5-10000-10 EX CREA
TOPICAL_CREAM | Freq: Two times a day (BID) | CUTANEOUS | 0 refills | Status: DC
  Filled 2020-12-11: qty 14.2, fill #0

## 2020-12-11 MED ORDER — SULFAMETHOXAZOLE-TRIMETHOPRIM 800-160 MG PO TABS: 1.0000 | ORAL_TABLET | Freq: Two times a day (BID) | ORAL | 0 refills | Status: DC

## 2020-12-11 MED ORDER — SULFAMETHOXAZOLE-TRIMETHOPRIM 800-160 MG PO TABS
1.0000 | ORAL_TABLET | Freq: Two times a day (BID) | ORAL | 0 refills | Status: DC
  Filled 2020-12-11: qty 20, 10d supply, fill #0

## 2020-12-11 NOTE — ED Triage Notes (Signed)
Pt comes with c/o abscess noted to belly button. Pt states he noticed it this am. Pt has bleeding present.

## 2020-12-11 NOTE — Discharge Instructions (Signed)
Follow discharge care instruction take medication as directed.  Use antibacterial soap provided as directed.

## 2020-12-11 NOTE — ED Provider Notes (Signed)
[facility] Emergency Department Provider Note   ____________________________________________   Event Date/Time   First MD Initiated Contact with Patient 12/11/20 1218     (approximate)  I have reviewed the triage vital signs and the nursing notes.   HISTORY  Chief Complaint Abscess    HPI Brian [name] is a 35 y.o. male patient presents with umbilical and mass.  Patient states the mass has been present for years but noticed bloody and greenish discharge today.  Patient denies pain with complaint.  Patient denies provocative incident for complaint.         Past Medical History:  Diagnosis Date  . Anxiety   . Bipolar 1 disorder ([place])   . Depression   . Hepatitis C   . Hypertension   . PTSD (post-traumatic stress disorder)     Patient Active Problem List   Diagnosis Date Noted  . Inhalant abuse ([place])   . Sprain of left foot 10/01/2016  . Essential hypertension 06/12/2015  . Esophageal reflux   . GERD (gastroesophageal reflux disease) 12/23/2014  . Fracture of ischium ([place]) 12/20/2014  . Schizoaffective disorder, bipolar type ([place]) 01/12/2014    Class: Acute    Past Surgical History:  Procedure Laterality Date  . HIP SURGERY      Prior to Admission medications   Medication Sig Start Date End Date Taking? Authorizing Provider  neomycin-polymyxin-pramoxine (NEOSPORIN PLUS) 1 % cream Apply topically 2 (two) times daily. 12/11/20  Yes Sable Feil, PA-C  oxyCODONE-acetaminophen (PERCOCET) 7.5-325 MG tablet Take 1 tablet by mouth every 6 (six) hours as needed for up to 3 days for severe pain. 12/11/20 12/14/20 Yes Sable Feil, PA-C  sulfamethoxazole-trimethoprim (BACTRIM DS) 800-160 MG tablet Take 1 tablet by mouth 2 (two) times daily. 12/11/20  Yes Sable Feil, PA-C  amLODipine (NORVASC) 10 MG tablet TAKE 1 TABLET (10 MG TOTAL) BY MOUTH DAILY. 07/02/20 07/02/21  Gildardo Pounds, NP  atorvastatin (LIPITOR) 20 MG tablet Take 1 tablet  (20 mg total) by mouth daily. 12/04/20   Gildardo Pounds, NP  benztropine (COGENTIN) 0.5 MG tablet Take 1 tablet (0.5 mg total) by mouth 2 (two) times daily. Patient not taking: Reported on 12/01/2020 12/24/14   Niel Hummer, NP  Blood Pressure Monitor DEVI Please provide patient with insurance approved blood pressure monitor 02/09/19   Gildardo Pounds, NP  cyclobenzaprine (FLEXERIL) 5 MG tablet Take 1 tablet (5 mg total) by mouth 3 (three) times daily as needed for muscle spasms. 12/01/20   Gildardo Pounds, NP  divalproex (DEPAKOTE ER) 500 MG 24 hr tablet TAKE 2 TABLET BY ORAL ROUTE PER AT BEDTIME 12/01/20 12/01/21  Gildardo Pounds, NP  FLUoxetine (PROZAC) 20 MG capsule TAKE 1 CAPSULE BY MOUTH ONCE DAILY Patient not taking: Reported on 12/01/2020 08/12/20 08/12/21    haloperidol (HALDOL) 10 MG tablet TAKE 1 TABLET BY MOUTH THREE TIMES DAILY Patient not taking: Reported on 12/01/2020 04/08/20 04/08/21    hydrOXYzine (VISTARIL) 25 MG capsule TAKE 1 CAPSULE BY ORAL ROUTE 3 TIMES PER DAY AS NEEDED Patient not taking: Reported on 12/01/2020 11/18/20 11/18/21    meloxicam (MOBIC) 7.5 MG tablet TAKE 1 TABLET (7.5 MG TOTAL) BY MOUTH DAILY. 09/01/20 09/01/21  Gildardo Pounds, NP  omeprazole (PRILOSEC) 20 MG capsule TAKE 1 CAPSULE (20 MG TOTAL) BY MOUTH DAILY. 09/01/20 09/01/21  Gildardo Pounds, NP  OVER THE COUNTER MEDICATION Take 1 Dose by mouth as needed (anxiety). Patient not taking:  Reported on 09/01/2020    [provider]  prazosin (MINIPRESS) 1 MG capsule TAKE 1 CAPSULE BY ORAL ROUTE PER AT BEDTIME 11/18/20 11/18/21    QUEtiapine (SEROQUEL) 25 MG tablet Take 25 mg by mouth at bedtime.  Patient not taking: Reported on 12/01/2020    [provider]  QUEtiapine (SEROQUEL) 25 MG tablet TAKE 1 TABLET BY MOUTH AT BEDTIME AS NEEDED FOR SLEEP. 11/18/20 11/18/21    ramelteon (ROZEREM) 8 MG tablet TAKE 1 TABLET BY ORAL ROUTE 1 TIME PER DAY AT BEDTIME 11/18/20 11/18/21      Allergies Phenytoin sodium  extended, Pineapple, and Risperdal [risperidone]  Family History  Problem Relation Age of Onset  . Hypertension Father   . Stroke Maternal Grandmother   . Diabetes Paternal Grandmother     Social History Social History   Tobacco Use  . Smoking status: Current Every Day Smoker    Packs/day: 1.00    Types: Cigarettes  . Smokeless tobacco: Former Network engineer  . Vaping Use: Some days  Substance Use Topics  . Alcohol use: Yes    Comment: one beer a day  . Drug use: Not Currently    Types: Methamphetamines, Heroin, Marijuana, IV, Solvent inhalants    Comment: "huffs" air canisters     Review of Systems Constitutional: No fever/chills Eyes: No visual changes. ENT: No sore throat. Cardiovascular: Denies chest pain. Respiratory: Denies shortness of breath. Gastrointestinal: No abdominal pain.  No nausea, no vomiting.  No diarrhea.  No constipation. Genitourinary: Negative for dysuria. Musculoskeletal: Negative for back pain. Skin: Negative for rash.  Umbilical mass. Neurological: Negative for headaches, focal weakness or numbness. Psychiatric:  Anxiety, bipolar, depression, and PTSD. Endocrine:  Hypertension. Hematological/Lymphatic:  Allergic/Immunilogical:Phenytoin, pineapple and Risperdal. ____________________________________________   PHYSICAL EXAM:  VITAL SIGNS: ED Triage Vitals  Enc Vitals Group     BP 12/11/20 1205 (!) 141/88     Pulse Rate 12/11/20 1205 (!) 103     Resp 12/11/20 1205 18     Temp 12/11/20 1205 97.9 F (36.6 C)     Temp src --      SpO2 12/11/20 1205 100 %     Weight --      Height --      Head Circumference --      Peak Flow --      Pain Score 12/11/20 1207 0     Pain Loc --      Pain Edu? --      Excl. in [place]? --     Constitutional: Alert and oriented. Well appearing and in no acute distress. Eyes: Conjunctivae are normal. PERRL. EOMI. Head: Atraumatic. Nose: No congestion/rhinnorhea. Mouth/Throat: Mucous membranes are moist.   Oropharynx non-erythematous. Neck: No stridor.  No cervical spine tenderness to palpation. Hematological/Lymphatic/Immunilogical: No cervical lymphadenopathy. Cardiovascular: Normal rate, regular rhythm. Grossly normal heart sounds.  Good peripheral circulation. Respiratory: Normal respiratory effort.  No retractions. Lungs CTAB. Gastrointestinal: Soft and nontender. No distention. No abdominal bruits. No CVA tenderness. Genitourinary: Deferred Musculoskeletal: No lower extremity tenderness nor edema.  No joint effusions. Neurologic:  Normal speech and language. No gross focal neurologic deficits are appreciated. No gait instability. Skin: Bloody/greenish drainage from umbilicus.. No rash noted. Psychiatric: Mood and affect are normal. Speech and behavior are normal.  ____________________________________________   LABS (all labs ordered are listed, but only abnormal results are displayed)  Labs Reviewed - No data to display ____________________________________________  EKG   ____________________________________________  RADIOLOGY Brendolyn Patty  Tamala Julian, personally viewed and evaluated these images (plain radiographs) as part of my medical decision making, as well as reviewing the written report by the radiologist.  ED MD interpretation:    Official radiology report(s): US Abdomen Limited  Result Date: 12/11/2020 CLINICAL DATA:  Umbilical mass with bleeding. Clinical concern for abscess. Patient reports that he has had a bulge in this area multiple years with pain for the past week and bleeding since this morning. EXAM: ULTRASOUND ABDOMEN LIMITED COMPARISON:  Abdomen and pelvis CT dated 01/12/2014. FINDINGS: On gated, oval, circumscribed, predominantly hypoechoic mass extending from the umbilicus posteriorly. The posterior extent is not visualized due to shadowing. There is some internal blood flow with color Doppler. No peristalsis seen at real-time. No corresponding mass or hernia seen  on the previous CT. The visualized portions measure 2.8 x 1.4 x 1.1 cm. IMPRESSION: Probable umbilical hernia containing. This does not have typical ultrasound features of an abscess or neoplasm. If further evaluation is clinically indicated, this could be accomplished with an abdomen and pelvis CT with contrast. Electronically Signed   By: Claudie Revering M.D.   On: 12/11/2020 14:29    ____________________________________________   PROCEDURES  Procedure(s) performed (including Critical Care):  Procedures   ____________________________________________   INITIAL IMPRESSION / ASSESSMENT AND PLAN / ED COURSE  As part of my medical decision making, I reviewed the following data within the [place]         Patient presents for bloody/greenish drainage from the umbilicus.  Differentials consist of abscess or infected hernia.  Patient ultrasound revealed umbilical hernias but findings are not consistent with abscess.  Patient complaint physical exam consistent with superficial skin infection.  Area was cleaned and patient given discharge care instruction.  Advised to take medication as directed.  Follow-up with PCP.      ____________________________________________   FINAL CLINICAL IMPRESSION(S) / ED DIAGNOSES  Final diagnoses:  Superficial bacterial skin infection  Umbilical hernia without obstruction and without gangrene     ED Discharge Orders         Ordered    neomycin-polymyxin-pramoxine (NEOSPORIN PLUS) 1 % cream  2 times daily        12/11/20 1443    sulfamethoxazole-trimethoprim (BACTRIM DS) 800-160 MG tablet  2 times daily        12/11/20 1443    oxyCODONE-acetaminophen (PERCOCET) 7.5-325 MG tablet  Every 6 hours PRN        12/11/20 1443          *Please note:  Gerasimos Plotts was evaluated in Emergency Department on 12/11/2020 for the symptoms described in the history of present illness. He was evaluated in the context of the global COVID-19  pandemic, which necessitated consideration that the patient might be at risk for infection with the SARS-CoV-2 virus that causes COVID-19. Institutional protocols and algorithms that pertain to the evaluation of patients at risk for COVID-19 are in a state of rapid change based on information released by regulatory bodies including the CDC and federal and state organizations. These policies and algorithms were followed during the patient's care in the ED.  Some ED evaluations and interventions may be delayed as a result of limited staffing during and the pandemic.*   Note:  This document was prepared using Dragon voice recognition software and may include unintentional dictation errors.    Sable Feil, PA-C 12/11/20 1449    Vanessa Callender, MD 12/12/20 [phone]

## 2020-12-11 NOTE — ED Notes (Signed)
Pt has been provided with discharge instructions. Pt denies any questions or concerns at this time. Pt verbalizes understanding for follow up care and d/c.  VSS.  Pt left department with all belongings.  

## 2020-12-12 ENCOUNTER — Other Ambulatory Visit: Payer: Self-pay

## 2020-12-22 ENCOUNTER — Other Ambulatory Visit: Payer: Self-pay | Admitting: Nurse Practitioner

## 2020-12-22 ENCOUNTER — Other Ambulatory Visit: Payer: Self-pay

## 2020-12-22 DIAGNOSIS — F250 Schizoaffective disorder, bipolar type: Secondary | ICD-10-CM

## 2020-12-22 DIAGNOSIS — I10 Essential (primary) hypertension: Secondary | ICD-10-CM

## 2020-12-22 MED ORDER — AMLODIPINE BESYLATE 10 MG PO TABS
ORAL_TABLET | Freq: Every day | ORAL | 0 refills | Status: DC
  Filled 2020-12-22: qty 90, fill #0
  Filled 2020-12-29: qty 90, 90d supply, fill #0

## 2020-12-22 MED FILL — Prazosin HCl Cap 1 MG: ORAL | 30 days supply | Qty: 30 | Fill #0 | Status: CN

## 2020-12-22 MED FILL — Hydroxyzine Pamoate Cap 25 MG: ORAL | 30 days supply | Qty: 90 | Fill #0 | Status: CN

## 2020-12-22 MED FILL — Ramelteon Tab 8 MG: ORAL | 30 days supply | Qty: 30 | Fill #1 | Status: CN

## 2020-12-22 NOTE — Telephone Encounter (Signed)
Per [facility] pharmacy Depakote is not due until 12/31/2020. They will get refill ready for Hydroxyzine. Left vm for patient to callback to get info

## 2020-12-22 NOTE — Telephone Encounter (Signed)
Future visit 02/04/21. Approved per protocol.  Requested Prescriptions  Pending Prescriptions Disp Refills  . amLODipine (NORVASC) 10 MG tablet 90 tablet 0    Sig: TAKE 1 TABLET (10 MG TOTAL) BY MOUTH DAILY.     Cardiovascular:  Calcium Channel Blockers Failed - 12/22/2020  1:47 PM      Failed - Last BP in normal range    BP Readings from Last 1 Encounters:  12/11/20 (!) 141/88         Passed - Valid encounter within last 6 months    Recent Outpatient Visits          3 weeks ago Essential hypertension   [place], Vernia Buff, NP   3 months ago Essential hypertension   [place], Vernia Buff, NP   5 months ago Essential hypertension   [place], Vernia Buff, NP   8 months ago Essential hypertension   [place], Vernia Buff, NP   1 year ago Essential hypertension   [place], RPH-CPP      Future Appointments            In 1 month Gildardo Pounds, NP [place]

## 2020-12-22 NOTE — Telephone Encounter (Signed)
Pts mother calling and is also requesting to have the pts prazosin, haloperidol, ramelteon refilled as well. Please advise.

## 2020-12-22 NOTE — Telephone Encounter (Signed)
Medication: Rx #: [id number] divalproex (DEPAKOTE ER) 500 MG 24 hr tablet [[id number]] , Rx #: [id number] hydrOXYzine (VISTARIL) 25 MG capsule [[id number]] ,     Has the patient contacted their pharmacy? YES (Agent: If no, request that the patient contact the pharmacy for the refill.) (Agent: If yes, when and what did the pharmacy advise?)  Preferred Pharmacy (with phone number or street name): [facility] [place] [address] Phone: [phone] Fax: [phone] Hours: M-F 8:30a-5:30p    Agent: Please be advised that RX refills may take up to 3 business days. We ask that you follow-up with your pharmacy.

## 2020-12-23 ENCOUNTER — Other Ambulatory Visit: Payer: Self-pay

## 2020-12-25 NOTE — Telephone Encounter (Signed)
This medications have not been prescribed by this clinic.  Most likely by psychiatry whom he needs to contact for refills.

## 2020-12-29 ENCOUNTER — Other Ambulatory Visit: Payer: Self-pay

## 2020-12-29 MED FILL — Ramelteon Tab 8 MG: ORAL | 30 days supply | Qty: 30 | Fill #1 | Status: AC

## 2020-12-29 MED FILL — Hydroxyzine Pamoate Cap 25 MG: ORAL | 30 days supply | Qty: 90 | Fill #0 | Status: AC

## 2020-12-29 MED FILL — Prazosin HCl Cap 1 MG: ORAL | 30 days supply | Qty: 30 | Fill #0 | Status: AC

## 2020-12-31 ENCOUNTER — Other Ambulatory Visit: Payer: Self-pay

## 2021-01-04 MED FILL — Quetiapine Fumarate Tab 25 MG: ORAL | 30 days supply | Qty: 30 | Fill #1 | Status: AC

## 2021-01-05 ENCOUNTER — Other Ambulatory Visit: Payer: Self-pay

## 2021-01-06 ENCOUNTER — Other Ambulatory Visit: Payer: Self-pay

## 2021-01-07 ENCOUNTER — Other Ambulatory Visit: Payer: Self-pay

## 2021-01-08 ENCOUNTER — Other Ambulatory Visit: Payer: Self-pay

## 2021-01-08 MED ORDER — FLUOXETINE HCL 20 MG PO CAPS
ORAL_CAPSULE | ORAL | 2 refills | Status: DC
  Filled 2021-01-08: qty 30, 30d supply, fill #0

## 2021-01-08 MED ORDER — HALOPERIDOL 10 MG PO TABS
ORAL_TABLET | ORAL | 2 refills | Status: DC
  Filled 2021-01-08: qty 90, 30d supply, fill #0

## 2021-01-12 ENCOUNTER — Other Ambulatory Visit: Payer: Self-pay

## 2021-01-14 ENCOUNTER — Other Ambulatory Visit: Payer: Self-pay

## 2021-01-22 ENCOUNTER — Other Ambulatory Visit: Payer: Self-pay

## 2021-01-29 ENCOUNTER — Other Ambulatory Visit: Payer: Self-pay

## 2021-02-04 ENCOUNTER — Encounter: Payer: Medicaid Other | Admitting: Nurse Practitioner

## 2021-02-04 ENCOUNTER — Telehealth: Payer: Self-pay | Admitting: Nurse Practitioner

## 2021-02-04 NOTE — Telephone Encounter (Signed)
Copied from [place] [id number]. Topic: General - Other >> Feb 02, 2021  8:06 AM Alanda Slim E wrote: Reason for CRM: FYI/ Pts sister(Jennifer) called to reschedule pts CPE appt from this week/ she stated that usually their mom brings him and is his care giver but she had had a stroke and can not do so at this time/ pts CPE has been rescheduled due to pt having to stay out of town with sister until things with his mom settle/ sister is  concerned that if he needs any refills for any medications / will Zelda be willing to refill due to the circumstances /if there are any question contact [facility] [place]

## 2021-02-12 NOTE — Telephone Encounter (Signed)
Patient has an appt. Scheduled to f/u with PCP. Will notify if more medication is needed prior to appt.

## 2021-03-04 ENCOUNTER — Other Ambulatory Visit: Payer: Self-pay | Admitting: Nurse Practitioner

## 2021-03-04 ENCOUNTER — Other Ambulatory Visit: Payer: Self-pay

## 2021-03-04 DIAGNOSIS — K219 Gastro-esophageal reflux disease without esophagitis: Secondary | ICD-10-CM

## 2021-03-04 DIAGNOSIS — E783 Hyperchylomicronemia: Secondary | ICD-10-CM

## 2021-03-04 MED ORDER — OMEPRAZOLE 20 MG PO CPDR
DELAYED_RELEASE_CAPSULE | Freq: Every day | ORAL | 0 refills | Status: DC
  Filled 2021-03-04: qty 90, 90d supply, fill #0

## 2021-03-04 MED ORDER — OMEPRAZOLE 20 MG PO CPDR: DELAYED_RELEASE_CAPSULE | Freq: Every day | ORAL | 0 refills | Status: DC

## 2021-03-04 MED ORDER — ATORVASTATIN CALCIUM 20 MG PO TABS: 20.0000 mg | ORAL_TABLET | Freq: Every day | ORAL | 3 refills | Status: DC

## 2021-03-04 NOTE — Telephone Encounter (Signed)
Copied from [place] [id number]. Topic: Quick Communication - Rx Refill/Question >> Mar 04, 2021 10:15 AM Yvette Rack wrote: Medication: atorvastatin (LIPITOR) 20 MG tablet and omeprazole (PRILOSEC) 20 MG capsule  Has the patient contacted their pharmacy? No. Rx never filled at requested pharmacy (Agent: If no, request that the patient contact the pharmacy for the refill.) (Agent: If yes, when and what did the pharmacy advise?)  Preferred Pharmacy (with phone number or street name): [place], [place] Phone: [phone]   Fax: [phone]  Agent: Please be advised that RX refills may take up to 3 business days. We ask that you follow-up with your pharmacy.

## 2021-03-18 ENCOUNTER — Encounter: Payer: Medicaid Other | Admitting: Nurse Practitioner

## 2021-05-04 ENCOUNTER — Other Ambulatory Visit: Payer: Self-pay

## 2021-05-04 ENCOUNTER — Ambulatory Visit: Payer: Medicaid Other | Attending: Nurse Practitioner | Admitting: Nurse Practitioner

## 2021-05-04 ENCOUNTER — Encounter: Payer: Self-pay | Admitting: Nurse Practitioner

## 2021-05-04 VITALS — BP 116/81 | HR 86 | Ht 71.0 in | Wt 274.2 lb

## 2021-05-04 DIAGNOSIS — M533 Sacrococcygeal disorders, not elsewhere classified: Secondary | ICD-10-CM | POA: Diagnosis not present

## 2021-05-04 DIAGNOSIS — R7989 Other specified abnormal findings of blood chemistry: Secondary | ICD-10-CM

## 2021-05-04 DIAGNOSIS — I10 Essential (primary) hypertension: Secondary | ICD-10-CM | POA: Diagnosis not present

## 2021-05-04 DIAGNOSIS — Z0001 Encounter for general adult medical examination with abnormal findings: Secondary | ICD-10-CM | POA: Diagnosis not present

## 2021-05-04 DIAGNOSIS — Z114 Encounter for screening for human immunodeficiency virus [HIV]: Secondary | ICD-10-CM

## 2021-05-04 DIAGNOSIS — E785 Hyperlipidemia, unspecified: Secondary | ICD-10-CM

## 2021-05-04 DIAGNOSIS — Z0000 Encounter for general adult medical examination without abnormal findings: Secondary | ICD-10-CM

## 2021-05-04 MED ORDER — NEOMYCIN-POLYMYXIN-HC 1 % OT SOLN: 3.0000 [drp] | Freq: Three times a day (TID) | OTIC | 0 refills | Status: DC

## 2021-05-04 MED ORDER — GABAPENTIN 300 MG PO CAPS
300.0000 mg | ORAL_CAPSULE | Freq: Three times a day (TID) | ORAL | 3 refills | Status: DC
  Filled 2021-05-04: qty 90, 30d supply, fill #0

## 2021-05-04 MED ORDER — GABAPENTIN 300 MG PO CAPS: 300.0000 mg | ORAL_CAPSULE | Freq: Three times a day (TID) | ORAL | 3 refills | Status: DC

## 2021-05-04 NOTE — Progress Notes (Signed)
Assessment & Plan:  Brian [name] was seen today for annual exam.  Diagnoses and all orders for this visit:  Encounter for annual physical exam  Abnormal CBC -     CBC  Essential hypertension -     CMP14+EGFR Continue all antihypertensives as prescribed.  Remember to bring in your blood pressure log with you for your follow up appointment.  DASH/Mediterranean Diets are healthier choices for HTN.    Dyslipidemia, goal LDL below 100 -     Lipid panel INSTRUCTIONS: Work on a low fat, heart healthy diet and participate in regular aerobic exercise program by working out at least 150 minutes per week; 5 days a week-30 minutes per day. Avoid red meat/beef/steak,  fried foods. junk foods, sodas, sugary drinks, unhealthy snacking, alcohol and smoking.  Drink at least 80 oz of water per day and monitor your carbohydrate intake daily.    Encounter for screening for HIV -     HIV antibody (with reflex)  Sacral back pain -     gabapentin (NEURONTIN) 300 MG capsule; Take 1 capsule (300 mg total) by mouth 3 (three) times daily. Work on losing weight to help reduce back pain. May alternate with heat and ice application for pain relief. May also alternate with acetaminophen  as prescribed for back pain. Other alternatives include massage, acupuncture and water aerobics.  You must stay active and avoid a sedentary lifestyle.     Patient has been counseled on age-appropriate routine health concerns for screening and prevention. These are reviewed and up-to-date. Referrals have been placed accordingly. Immunizations are up-to-date or declined.    Subjective:   Chief Complaint  Patient presents with   Annual Exam   HPI Brian [name] 35 y.o. male presents to office today for annual physical.  His mother had a stroke a few months ago and he missed his appt with me at that time.   He sees a behavioral health specialist for his anxiety, PTSD and schizo affective bipolar disorder. Endorses insomnia  despite taking prazosin.  He is not sure if he is taking ramelteon.  BACK PAIN Continues with lower lumbar and sacral pain. He fell a few years ago on his buttocks and believes he was told he had a coccyx fracture but does not recall any imaging being performed. Pain is 6/10 and somewhat improved with using his mother's meloxicam. I will start him on gabapentin due to risk of GI bleed with taking NSAIDs and SSRI. Pain is worse with sitting. I ordered an xray several months ago however he still has not had this completed.   HTN Blood pressure is well controlled with amlodipine 10 mg daily. Denies chest pain, shortness of breath, palpitations, lightheadedness, dizziness, headaches or BLE edema.   BP Readings from Last 3 Encounters:  05/04/21 116/81  12/11/20 (!) 141/88  12/01/20 131/88    Review of Systems  Constitutional:  Negative for fever, malaise/fatigue and weight loss.  HENT: Negative.  Negative for nosebleeds.   Eyes: Negative.  Negative for blurred vision, double vision and photophobia.  Respiratory: Negative.  Negative for cough and shortness of breath.   Cardiovascular: Negative.  Negative for chest pain, palpitations and leg swelling.  Gastrointestinal: Negative.  Negative for heartburn, nausea and vomiting.  Genitourinary: Negative.   Musculoskeletal:  Positive for back pain and joint pain. Negative for myalgias.  Skin: Negative.   Neurological: Negative.  Negative for dizziness, focal weakness, seizures and headaches.  Endo/Heme/Allergies: Negative.   Psychiatric/Behavioral:  Positive for  depression. Negative for suicidal ideas. The patient is nervous/anxious and has insomnia.    Past Medical History:  Diagnosis Date   Anxiety    Bipolar 1 disorder ([place])    Depression    Hepatitis C    Hypertension    PTSD (post-traumatic stress disorder)     Past Surgical History:  Procedure Laterality Date   HIP SURGERY      Family History  Problem Relation Age of Onset    Hypertension Father    Stroke Maternal Grandmother    Diabetes Paternal Grandmother     Social History Reviewed with no changes to be made today.   Outpatient Medications Prior to Visit  Medication Sig Dispense Refill   amLODipine (NORVASC) 10 MG tablet TAKE 1 TABLET (10 MG TOTAL) BY MOUTH DAILY. 90 tablet 0   atorvastatin (LIPITOR) 20 MG tablet Take 1 tablet (20 mg total) by mouth daily. 90 tablet 3   benztropine (COGENTIN) 0.5 MG tablet Take 1 tablet (0.5 mg total) by mouth 2 (two) times daily. (Patient not taking: Reported on 12/01/2020) 60 tablet 0   Blood Pressure Monitor DEVI Please provide patient with insurance approved blood pressure monitor 1 Device 0   divalproex (DEPAKOTE ER) 500 MG 24 hr tablet TAKE 2 TABLET BY ORAL ROUTE PER AT BEDTIME 60 tablet 2   FLUoxetine (PROZAC) 20 MG capsule Take 1 Capsule By Oral Route 1 time per day 30 capsule 2   haloperidol (HALDOL) 10 MG tablet Take 1 Tablet By Oral Route 3 times per day 90 tablet 2   hydrOXYzine (VISTARIL) 25 MG capsule TAKE 1 CAPSULE BY ORAL ROUTE 3 TIMES PER DAY AS NEEDED (Patient not taking: Reported on 12/01/2020) 90 capsule 2   neomycin-polymyxin-pramoxine (NEOSPORIN PLUS) 1 % cream Apply topically 2 (two) times daily. 14.2 g 0   omeprazole (PRILOSEC) 20 MG capsule TAKE 1 CAPSULE (20 MG TOTAL) BY MOUTH DAILY. 90 capsule 0   OVER THE COUNTER MEDICATION Take 1 Dose by mouth as needed (anxiety). (Patient not taking: Reported on 09/01/2020)     prazosin (MINIPRESS) 1 MG capsule TAKE 1 CAPSULE BY ORAL ROUTE PER AT BEDTIME 30 capsule 2   QUEtiapine (SEROQUEL) 25 MG tablet TAKE 1 TABLET BY MOUTH AT BEDTIME AS NEEDED FOR SLEEP. 30 tablet 2   ramelteon (ROZEREM) 8 MG tablet TAKE 1 TABLET BY ORAL ROUTE 1 TIME PER DAY AT BEDTIME 30 tablet 2   cyclobenzaprine (FLEXERIL) 5 MG tablet Take 1 tablet (5 mg total) by mouth 3 (three) times daily as needed for muscle spasms. (Patient not taking: Reported on 05/04/2021) 30 tablet 1   FLUoxetine  (PROZAC) 20 MG capsule TAKE 1 CAPSULE BY MOUTH ONCE DAILY (Patient not taking: Reported on 12/01/2020) 30 capsule 2   haloperidol (HALDOL) 10 MG tablet TAKE 1 TABLET BY MOUTH THREE TIMES DAILY (Patient not taking: Reported on 12/01/2020) 90 tablet 2   meloxicam (MOBIC) 7.5 MG tablet TAKE 1 TABLET (7.5 MG TOTAL) BY MOUTH DAILY. (Patient not taking: Reported on 05/04/2021) 30 tablet 0   QUEtiapine (SEROQUEL) 25 MG tablet Take 25 mg by mouth at bedtime.  (Patient not taking: Reported on 12/01/2020)     sulfamethoxazole-trimethoprim (BACTRIM DS) 800-160 MG tablet Take 1 tablet by mouth 2 (two) times daily. 20 tablet 0   No facility-administered medications prior to visit.    Allergies  Allergen Reactions   Phenytoin Sodium Extended Other (See Comments)    Gets sick to his stomach, and closes throat up  Pineapple Anaphylaxis   Risperdal [Risperidone] Shortness Of Breath    'throat closes up'       Objective:    BP 116/81   Pulse 86   Ht _0  (1.803 m)   Wt 274 lb 4 oz (124.4 kg)   SpO2 96%   BMI 38.25 kg/m  Wt Readings from Last 3 Encounters:  05/04/21 274 lb 4 oz (124.4 kg)  12/01/20 259 lb 3.2 oz (117.6 kg)  09/01/20 267 lb (121.1 kg)    Physical Exam Constitutional:      Appearance: He is well-developed.  HENT:     Head: Normocephalic and atraumatic.     Jaw: There is normal jaw occlusion.     Right Ear: Hearing, ear canal and external ear normal. Tympanic membrane is erythematous.     Left Ear: Hearing, tympanic membrane, ear canal and external ear normal.     Nose: Nose normal.     Right Turbinates: Not swollen or pale.     Left Turbinates: Not swollen or pale.     Right Sinus: No maxillary sinus tenderness or frontal sinus tenderness.     Left Sinus: No maxillary sinus tenderness or frontal sinus tenderness.  Eyes:     General: Lids are normal. No scleral icterus.       Right eye: No foreign body, discharge or hordeolum.        Left eye: No foreign body, discharge or  hordeolum.     Extraocular Movements:     Right eye: Nystagmus present.     Left eye: Nystagmus present.     Conjunctiva/sclera: Conjunctivae normal.     Pupils: Pupils are equal, round, and reactive to light.  Neck:     Thyroid: No thyroid mass or thyromegaly.     Trachea: No tracheal deviation.  Cardiovascular:     Rate and Rhythm: Normal rate and regular rhythm.     Pulses:          Posterior tibial pulses are 2+ on the right side and 2+ on the left side.     Heart sounds: Normal heart sounds. No murmur heard.   No friction rub. No gallop.  Pulmonary:     Effort: Pulmonary effort is normal. No respiratory distress.     Breath sounds: Normal breath sounds. No decreased breath sounds, wheezing or rales.  Chest:     Chest wall: No tenderness.  Abdominal:     General: Abdomen is flat. Bowel sounds are normal. There is no distension.     Palpations: Abdomen is soft. There is no mass.     Tenderness: There is no abdominal tenderness. There is no guarding or rebound.     Hernia: There is no hernia in the left inguinal area.  Musculoskeletal:        General: No tenderness or deformity. Normal range of motion.     Cervical back: Full passive range of motion without pain, normal range of motion and neck supple.     Right lower leg: No edema.     Left lower leg: No edema.     Right foot: Normal range of motion.     Left foot: Normal range of motion.  Feet:     Right foot:     Skin integrity: No skin breakdown.     Left foot:     Skin integrity: No skin breakdown.  Lymphadenopathy:     Cervical: No cervical adenopathy.  Skin:    General: Skin is  warm and dry.     Findings: No erythema.  Neurological:     Mental Status: He is alert and oriented to person, place, and time.     Cranial Nerves: No cranial nerve deficit.     Sensory: Sensation is intact. No sensory deficit.     Motor: Motor function is intact. No abnormal muscle tone or seizure activity.     Coordination:  Coordination is intact. Romberg sign negative. Coordination normal. Finger-Nose-Finger Test and Heel to [facility] Test normal.     Gait: Gait is intact.     Deep Tendon Reflexes: Reflexes normal.     Reflex Scores:      Patellar reflexes are 2+ on the right side and 2+ on the left side. Psychiatric:        Attention and Perception: Attention normal.        Mood and Affect: Mood normal.        Speech: Speech normal.        Behavior: Behavior normal. Behavior is cooperative.        Thought Content: Thought content normal.        Cognition and Memory: Cognition and memory normal.        Judgment: Judgment normal.         Patient has been counseled extensively about nutrition and exercise as well as the importance of adherence with medications and regular follow-up. The patient was given clear instructions to go to ER or return to medical center if symptoms don't improve, worsen or new problems develop. The patient verbalized understanding.   Follow-up: Return in about 3 months (around 08/03/2021).   Gildardo Pounds, FNP-BC [facility] and [place], [place]   05/04/2021, 1:36 PM

## 2021-05-05 LAB — CMP14+EGFR
ALT: 16 IU/L (ref 0–44)
AST: 21 IU/L (ref 0–40)
Albumin/Globulin Ratio: 2.2 (ref 1.2–2.2)
Albumin: 4.6 g/dL (ref 4.0–5.0)
Alkaline Phosphatase: 81 IU/L (ref 44–121)
BUN/Creatinine Ratio: 7 — ABNORMAL LOW (ref 9–20)
BUN: 7 mg/dL (ref 6–20)
Bilirubin Total: 0.4 mg/dL (ref 0.0–1.2)
CO2: 23 mmol/L (ref 20–29)
Calcium: 10 mg/dL (ref 8.7–10.2)
Chloride: 92 mmol/L — ABNORMAL LOW (ref 96–106)
Creatinine, Ser: 0.95 mg/dL (ref 0.76–1.27)
Globulin, Total: 2.1 g/dL (ref 1.5–4.5)
Glucose: 70 mg/dL (ref 65–99)
Potassium: 4.2 mmol/L (ref 3.5–5.2)
Sodium: 131 mmol/L — ABNORMAL LOW (ref 134–144)
Total Protein: 6.7 g/dL (ref 6.0–8.5)
eGFR: 106 mL/min/{1.73_m2} (ref >59)

## 2021-05-05 LAB — LIPID PANEL
Chol/HDL Ratio: 3.3 ratio (ref 0.0–5.0)
Cholesterol, Total: 136 mg/dL (ref 100–199)
HDL: 41 mg/dL (ref >39)
LDL Chol Calc (NIH): 67 mg/dL (ref 0–99)
Triglycerides: 163 mg/dL — ABNORMAL HIGH (ref 0–149)
VLDL Cholesterol Cal: 28 mg/dL (ref 5–40)

## 2021-05-05 LAB — CBC
Hematocrit: 41.6 % (ref 37.5–51.0)
Hemoglobin: 14.6 g/dL (ref 13.0–17.7)
MCH: 30 pg (ref 26.6–33.0)
MCHC: 35.1 g/dL (ref 31.5–35.7)
MCV: 85 fL (ref 79–97)
Platelets: 309 10*3/uL (ref 150–450)
RBC: 4.87 x10E6/uL (ref 4.14–5.80)
RDW: 11.6 % (ref 11.6–15.4)
WBC: 6 10*3/uL (ref 3.4–10.8)

## 2021-05-05 LAB — HIV ANTIBODY (ROUTINE TESTING W REFLEX): HIV Screen 4th Generation wRfx: NONREACTIVE

## 2021-08-03 ENCOUNTER — Other Ambulatory Visit: Payer: Self-pay

## 2021-08-03 ENCOUNTER — Encounter: Payer: Self-pay | Admitting: Nurse Practitioner

## 2021-08-03 ENCOUNTER — Ambulatory Visit: Payer: Medicaid Other | Attending: Nurse Practitioner | Admitting: Nurse Practitioner

## 2021-08-03 VITALS — BP 126/85 | HR 92 | Ht 71.0 in | Wt 267.5 lb

## 2021-08-03 DIAGNOSIS — M545 Low back pain, unspecified: Secondary | ICD-10-CM

## 2021-08-03 DIAGNOSIS — F17210 Nicotine dependence, cigarettes, uncomplicated: Secondary | ICD-10-CM

## 2021-08-03 DIAGNOSIS — E871 Hypo-osmolality and hyponatremia: Secondary | ICD-10-CM

## 2021-08-03 DIAGNOSIS — F17200 Nicotine dependence, unspecified, uncomplicated: Secondary | ICD-10-CM

## 2021-08-03 DIAGNOSIS — Z23 Encounter for immunization: Secondary | ICD-10-CM

## 2021-08-03 DIAGNOSIS — I10 Essential (primary) hypertension: Secondary | ICD-10-CM | POA: Diagnosis not present

## 2021-08-03 DIAGNOSIS — G8929 Other chronic pain: Secondary | ICD-10-CM

## 2021-08-03 MED ORDER — AMLODIPINE BESYLATE 10 MG PO TABS
10.0000 mg | ORAL_TABLET | Freq: Every day | ORAL | 1 refills | Status: DC
Start: 2021-08-03 — End: 2021-11-03

## 2021-08-03 MED ORDER — ALBUTEROL SULFATE HFA 108 (90 BASE) MCG/ACT IN AERS
2.0000 | INHALATION_SPRAY | Freq: Four times a day (QID) | RESPIRATORY_TRACT | 1 refills | Status: DC | PRN
Start: 2021-08-03 — End: 2022-01-13

## 2021-08-03 MED ORDER — CYCLOBENZAPRINE HCL 10 MG PO TABS: 10.0000 mg | ORAL_TABLET | Freq: Three times a day (TID) | ORAL | 1 refills | Status: AC | PRN

## 2021-08-03 NOTE — Progress Notes (Signed)
Assessment & Plan:  Brian [name] was seen today for hypertension.  Diagnoses and all orders for this visit:  Primary hypertension -     amLODipine (NORVASC) 10 MG tablet; Take 1 tablet (10 mg total) by mouth daily.  Chronic bilateral low back pain without sciatica -     cyclobenzaprine (FLEXERIL) 10 MG tablet; Take 1 tablet (10 mg total) by mouth 3 (three) times daily as needed for muscle spasms. He has been instructed today to start working out by walking daily due to excess weight and back pain.    Need for influenza vaccination -     Flu Vaccine QUAD 58moIM (Fluarix, Fluzone & Alfiuria Quad PF)  Hyponatremia -     CMP14+EGFR  Tobacco dependence -     albuterol (VENTOLIN HFA) 108 (90 Base) MCG/ACT inhaler; Inhale 2 puffs into the lungs every 6 (six) hours as needed for wheezing or shortness of breath.   Patient has been counseled on age-appropriate routine health concerns for screening and prevention. These are reviewed and up-to-date. Referrals have been placed accordingly. Immunizations are up-to-date or declined.    Subjective:   Chief Complaint  Patient presents with   Hypertension   HPI Brian Lasky89y.o. male presents to office today for follow up on his hypertension. He has a past medical history of Anxiety, Bipolar 1 disorder ([place], Depression, Hepatitis C, Hypertension, and PTSD (post-traumatic stress disorder).   BACK PAIN Continues with lower lumbar and sacral pain. He fell a few years ago on his buttocks and believes he was told he had a coccyx fracture but does not recall any imaging being performed. Pain is 6/10.  He is taking gabapentin due to risk of GI bleed with taking NSAIDs and SSRI. Pain is worse with sitting and has recently increased due to him moving from 1 home to another a few weeks ago. I ordered an xray of the sacrum/coccyx several months ago however he still has not had this completed.   HTN Blood pressure is well controlled.  Amlodipine 10 mg  daily has been filled today. BP Readings from Last 3 Encounters:  08/03/21 126/85  05/04/21 116/81  12/11/20 (!) 141/88     Review of Systems  Constitutional:  Negative for fever, malaise/fatigue and weight loss.  HENT: Negative.  Negative for nosebleeds.   Eyes: Negative.  Negative for blurred vision, double vision and photophobia.  Respiratory:  Negative for cough, shortness of breath and wheezing.   Cardiovascular: Negative.  Negative for chest pain, palpitations and leg swelling.  Gastrointestinal: Negative.  Negative for heartburn, nausea and vomiting.  Musculoskeletal:  Positive for back pain and myalgias.  Neurological: Negative.  Negative for dizziness, focal weakness, seizures and headaches.  Psychiatric/Behavioral: Negative.  Negative for suicidal ideas.    Past Medical History:  Diagnosis Date   Anxiety    Bipolar 1 disorder ([place]    Depression    Hepatitis C    Hypertension    PTSD (post-traumatic stress disorder)     Past Surgical History:  Procedure Laterality Date   HIP SURGERY      Family History  Problem Relation Age of Onset   Hypertension Father    Stroke Maternal Grandmother    Diabetes Paternal Grandmother     Social History Reviewed with no changes to be made today.   Outpatient Medications Prior to Visit  Medication Sig Dispense Refill   atorvastatin (LIPITOR) 20 MG tablet Take 1 tablet (20 mg total) by mouth daily. [place]  tablet 3   benztropine (COGENTIN) 0.5 MG tablet Take 1 tablet (0.5 mg total) by mouth 2 (two) times daily. 60 tablet 0   Blood Pressure Monitor DEVI Please provide patient with insurance approved blood pressure monitor 1 Device 0   divalproex (DEPAKOTE ER) 500 MG 24 hr tablet TAKE 2 TABLET BY ORAL ROUTE PER AT BEDTIME 60 tablet 2   FLUoxetine (PROZAC) 20 MG capsule Take 1 Capsule By Oral Route 1 time per day 30 capsule 2   gabapentin (NEURONTIN) 300 MG capsule Take 1 capsule (300 mg total) by mouth 3 (three) times daily. 90  capsule 3   haloperidol (HALDOL) 10 MG tablet Take 1 Tablet By Oral Route 3 times per day 90 tablet 2   hydrOXYzine (VISTARIL) 25 MG capsule TAKE 1 CAPSULE BY ORAL ROUTE 3 TIMES PER DAY AS NEEDED 90 capsule 2   NEOMYCIN-POLYMYXIN-HYDROCORTISONE (CORTISPORIN) 1 % SOLN OTIC solution Place 3 drops into the right ear every 8 (eight) hours. 10 mL 0   neomycin-polymyxin-pramoxine (NEOSPORIN PLUS) 1 % cream Apply topically 2 (two) times daily. 14.2 g 0   omeprazole (PRILOSEC) 20 MG capsule TAKE 1 CAPSULE (20 MG TOTAL) BY MOUTH DAILY. 90 capsule 0   OVER THE COUNTER MEDICATION Take 1 Dose by mouth as needed (anxiety).     prazosin (MINIPRESS) 1 MG capsule TAKE 1 CAPSULE BY ORAL ROUTE PER AT BEDTIME 30 capsule 2   QUEtiapine (SEROQUEL) 25 MG tablet TAKE 1 TABLET BY MOUTH AT BEDTIME AS NEEDED FOR SLEEP. 30 tablet 2   ramelteon (ROZEREM) 8 MG tablet TAKE 1 TABLET BY ORAL ROUTE 1 TIME PER DAY AT BEDTIME 30 tablet 2   amLODipine (NORVASC) 10 MG tablet TAKE 1 TABLET (10 MG TOTAL) BY MOUTH DAILY. 90 tablet 0   No facility-administered medications prior to visit.    Allergies  Allergen Reactions   Phenytoin Sodium Extended Other (See Comments)    Gets sick to his stomach, and closes throat up   Pineapple Anaphylaxis   Risperdal [Risperidone] Shortness Of Breath    'throat closes up'       Objective:    BP 126/85   Pulse 92   Ht 5' 11"  (1.803 m)   Wt 267 lb 8 oz (121.3 kg)   SpO2 96%   BMI 37.31 kg/m  Wt Readings from Last 3 Encounters:  08/03/21 267 lb 8 oz (121.3 kg)  05/04/21 274 lb 4 oz (124.4 kg)  12/01/20 259 lb 3.2 oz (117.6 kg)    Physical Exam Vitals and nursing note reviewed.  Constitutional:      Appearance: He is well-developed.  HENT:     Head: Normocephalic and atraumatic.  Cardiovascular:     Rate and Rhythm: Normal rate and regular rhythm.     Heart sounds: Normal heart sounds. No murmur heard.   No friction rub. No gallop.  Pulmonary:     Effort: Pulmonary effort is  normal. No tachypnea or respiratory distress.     Breath sounds: Examination of the right-lower field reveals wheezing. Examination of the left-lower field reveals wheezing. Wheezing present. No decreased breath sounds, rhonchi or rales.  Chest:     Chest wall: No tenderness.  Abdominal:     General: Bowel sounds are normal.     Palpations: Abdomen is soft.  Musculoskeletal:        General: Normal range of motion.     Cervical back: Normal range of motion.  Skin:    General: Skin is  warm and dry.  Neurological:     Mental Status: He is alert and oriented to person, place, and time.     Coordination: Coordination normal.  Psychiatric:        Behavior: Behavior normal. Behavior is cooperative.        Thought Content: Thought content normal.        Judgment: Judgment normal.         Patient has been counseled extensively about nutrition and exercise as well as the importance of adherence with medications and regular follow-up. The patient was given clear instructions to go to ER or return to medical center if symptoms don't improve, worsen or new problems develop. The patient verbalized understanding.   Follow-up: Return in about 3 months (around 11/01/2021).   Gildardo Pounds, FNP-BC [facility] and [place], [place]   08/03/2021, 1:20 PM

## 2021-08-04 LAB — CMP14+EGFR
ALT: 41 IU/L (ref 0–44)
AST: 38 IU/L (ref 0–40)
Albumin/Globulin Ratio: 2.3 — ABNORMAL HIGH (ref 1.2–2.2)
Albumin: 4.9 g/dL (ref 4.0–5.0)
Alkaline Phosphatase: 104 IU/L (ref 44–121)
BUN/Creatinine Ratio: 9 (ref 9–20)
BUN: 9 mg/dL (ref 6–20)
Bilirubin Total: 0.5 mg/dL (ref 0.0–1.2)
CO2: 24 mmol/L (ref 20–29)
Calcium: 9.8 mg/dL (ref 8.7–10.2)
Chloride: 99 mmol/L (ref 96–106)
Creatinine, Ser: 1.03 mg/dL (ref 0.76–1.27)
Globulin, Total: 2.1 g/dL (ref 1.5–4.5)
Glucose: 86 mg/dL (ref 70–99)
Potassium: 4.2 mmol/L (ref 3.5–5.2)
Sodium: 138 mmol/L (ref 134–144)
Total Protein: 7 g/dL (ref 6.0–8.5)
eGFR: 96 mL/min/{1.73_m2} (ref >59)

## 2021-09-01 ENCOUNTER — Other Ambulatory Visit: Payer: Self-pay | Admitting: Nurse Practitioner

## 2021-09-01 DIAGNOSIS — K219 Gastro-esophageal reflux disease without esophagitis: Secondary | ICD-10-CM

## 2021-09-01 MED ORDER — OMEPRAZOLE 20 MG PO CPDR: DELAYED_RELEASE_CAPSULE | Freq: Every day | ORAL | 2 refills | Status: DC

## 2021-09-01 NOTE — Telephone Encounter (Signed)
Medication Refill - Medication:   omeprazole (PRILOSEC) 20 MG capsule   Has the patient contacted their pharmacy? Yes.   No refills left contact pcp.  (Agent: If no, request that the patient contact the pharmacy for the refill. If patient does not wish to contact the pharmacy document the reason why and proceed with request.) (Agent: If yes, when and what did the pharmacy advise?)  Preferred Pharmacy (with phone number or street name):   [address], [address]  [place] [address]  Phone: [phone] Fax: [phone]   Has the patient been seen for an appointment in the last year OR does the patient have an upcoming appointment? Yes.    Agent: Please be advised that RX refills may take up to 3 business days. We ask that you follow-up with your pharmacy.

## 2021-09-01 NOTE — Telephone Encounter (Signed)
Requested Prescriptions  Pending Prescriptions Disp Refills   omeprazole (PRILOSEC) 20 MG capsule 90 capsule 2    Sig: TAKE 1 CAPSULE (20 MG TOTAL) BY MOUTH DAILY.     Gastroenterology: Proton Pump Inhibitors Passed - 09/01/2021  4:17 PM      Passed - Valid encounter within last 12 months    Recent Outpatient Visits          4 weeks ago Primary hypertension   [place], Vernia Buff, NP   4 months ago Encounter for annual physical exam   [place], Vernia Buff, NP   9 months ago Essential hypertension   [place], Vernia Buff, NP   1 year ago Essential hypertension   [place], Vernia Buff, NP   1 year ago Essential hypertension   [place], Zelda W, NP      Future Appointments            In 2 months Gildardo Pounds, NP [place]

## 2021-09-24 ENCOUNTER — Other Ambulatory Visit: Payer: Self-pay

## 2021-09-24 MED ORDER — PRAZOSIN HCL 1 MG PO CAPS
ORAL_CAPSULE | ORAL | 3 refills | Status: DC
  Filled 2021-09-24: qty 30, 30d supply, fill #0

## 2021-09-24 MED ORDER — HALOPERIDOL 10 MG PO TABS
ORAL_TABLET | ORAL | 3 refills | Status: DC
  Filled 2021-09-24: qty 90, 30d supply, fill #0

## 2021-09-24 MED ORDER — RAMELTEON 8 MG PO TABS
ORAL_TABLET | ORAL | 3 refills | Status: DC
  Filled 2021-09-24: qty 30, 30d supply, fill #0

## 2021-09-24 MED ORDER — HYDROXYZINE PAMOATE 25 MG PO CAPS
ORAL_CAPSULE | ORAL | 3 refills | Status: DC
  Filled 2021-09-24: qty 90, 30d supply, fill #0

## 2021-09-24 MED ORDER — BENZTROPINE MESYLATE 0.5 MG PO TABS
ORAL_TABLET | ORAL | 3 refills | Status: DC
  Filled 2021-09-24: qty 60, 30d supply, fill #0

## 2021-09-24 MED ORDER — DIVALPROEX SODIUM ER 500 MG PO TB24
ORAL_TABLET | ORAL | 3 refills | Status: DC
  Filled 2021-09-24: qty 60, 30d supply, fill #0

## 2021-09-24 MED ORDER — NALTREXONE HCL 50 MG PO TABS
ORAL_TABLET | ORAL | 3 refills | Status: DC
  Filled 2021-09-24: qty 30, 30d supply, fill #0

## 2021-09-24 MED ORDER — QUETIAPINE FUMARATE 25 MG PO TABS
ORAL_TABLET | ORAL | 3 refills | Status: DC
  Filled 2021-09-24: qty 30, 30d supply, fill #0

## 2021-09-24 MED ORDER — FLUOXETINE HCL 20 MG PO CAPS
ORAL_CAPSULE | ORAL | 3 refills | Status: DC
  Filled 2021-09-24: qty 30, 30d supply, fill #0

## 2021-09-25 ENCOUNTER — Other Ambulatory Visit: Payer: Self-pay

## 2021-10-09 ENCOUNTER — Other Ambulatory Visit: Payer: Self-pay

## 2021-11-02 ENCOUNTER — Ambulatory Visit: Payer: Medicaid Other | Admitting: Nurse Practitioner

## 2021-11-03 ENCOUNTER — Other Ambulatory Visit: Payer: Self-pay

## 2021-11-03 ENCOUNTER — Encounter: Payer: Self-pay | Admitting: Family Medicine

## 2021-11-03 ENCOUNTER — Ambulatory Visit: Payer: Medicaid Other | Attending: Family Medicine | Admitting: Family Medicine

## 2021-11-03 VITALS — BP 127/82 | HR 94 | Resp 18 | Ht 74.0 in | Wt 275.0 lb

## 2021-11-03 DIAGNOSIS — G8929 Other chronic pain: Secondary | ICD-10-CM | POA: Diagnosis not present

## 2021-11-03 DIAGNOSIS — E783 Hyperchylomicronemia: Secondary | ICD-10-CM | POA: Insufficient documentation

## 2021-11-03 DIAGNOSIS — F250 Schizoaffective disorder, bipolar type: Secondary | ICD-10-CM | POA: Diagnosis not present

## 2021-11-03 DIAGNOSIS — I10 Essential (primary) hypertension: Secondary | ICD-10-CM | POA: Insufficient documentation

## 2021-11-03 DIAGNOSIS — Z79899 Other long term (current) drug therapy: Secondary | ICD-10-CM | POA: Diagnosis not present

## 2021-11-03 DIAGNOSIS — M545 Low back pain, unspecified: Secondary | ICD-10-CM | POA: Diagnosis not present

## 2021-11-03 MED ORDER — AMLODIPINE BESYLATE 10 MG PO TABS: 10.0000 mg | ORAL_TABLET | Freq: Every day | ORAL | 1 refills | Status: DC

## 2021-11-03 MED ORDER — ATORVASTATIN CALCIUM 20 MG PO TABS: 20.0000 mg | ORAL_TABLET | Freq: Every day | ORAL | 1 refills | Status: DC

## 2021-11-03 MED ORDER — ATORVASTATIN CALCIUM 20 MG PO TABS
20.0000 mg | ORAL_TABLET | Freq: Every day | ORAL | 1 refills | Status: DC
  Filled 2021-11-03: qty 30, 30d supply, fill #0

## 2021-11-03 MED ORDER — GABAPENTIN 300 MG PO CAPS
300.0000 mg | ORAL_CAPSULE | Freq: Three times a day (TID) | ORAL | 3 refills | Status: DC
  Filled 2021-11-03: qty 90, 30d supply, fill #0

## 2021-11-03 MED ORDER — AMLODIPINE BESYLATE 10 MG PO TABS
10.0000 mg | ORAL_TABLET | Freq: Every day | ORAL | 1 refills | Status: DC
  Filled 2021-11-03: qty 90, 90d supply, fill #0

## 2021-11-03 MED ORDER — GABAPENTIN 300 MG PO CAPS: 300.0000 mg | ORAL_CAPSULE | Freq: Three times a day (TID) | ORAL | 3 refills | Status: DC
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
## 2022-01-12 ENCOUNTER — Other Ambulatory Visit: Payer: Self-pay | Admitting: Nurse Practitioner

## 2022-01-12 DIAGNOSIS — F17200 Nicotine dependence, unspecified, uncomplicated: Secondary | ICD-10-CM

## 2022-01-13 NOTE — Telephone Encounter (Signed)
Requested Prescriptions  Pending Prescriptions Disp Refills  . VENTOLIN HFA 108 (90 Base) MCG/ACT inhaler [Pharmacy Med Name: Ventolin HFA 108 (90 Base) MCG/ACT Inhalation Aerosol Solution] 18 g 0    Sig: INHALE 2 PUFFS INTO LUNGS EVERY 6 HOURS AS NEEDED FOR WHEEZING FOR SHORTNESS OF BREATH     Pulmonology:  Beta Agonists 2 Passed - 01/12/2022  2:50 PM      Passed - Last BP in normal range    BP Readings from Last 1 Encounters:  11/03/21 127/82         Passed - Last Heart Rate in normal range    Pulse Readings from Last 1 Encounters:  11/03/21 94         Passed - Valid encounter within last 12 months    Recent Outpatient Visits          2 months ago Chronic lumbosacral pain   [place], Charlane Ferretti, MD   5 months ago Primary hypertension   [place], Vernia Buff, NP   8 months ago Encounter for annual physical exam   [place], Vernia Buff, NP   1 year ago Essential hypertension   [place], Vernia Buff, NP   1 year ago Essential hypertension   [place], Vernia Buff, NP      Future Appointments            In 3 weeks Gildardo Pounds, NP [place]

## 2022-02-03 ENCOUNTER — Ambulatory Visit
Admission: RE | Admit: 2022-02-03 | Discharge: 2022-02-03 | Disposition: A | Discharge status: Home / Self Care | Payer: Medicaid Other | Source: Ambulatory Visit | Attending: Nurse Practitioner | Admitting: Nurse Practitioner

## 2022-02-03 ENCOUNTER — Telehealth: Payer: Self-pay | Admitting: Nurse Practitioner

## 2022-02-03 ENCOUNTER — Ambulatory Visit: Payer: Medicaid Other | Attending: Nurse Practitioner | Admitting: Nurse Practitioner

## 2022-02-03 ENCOUNTER — Encounter: Payer: Self-pay | Admitting: Nurse Practitioner

## 2022-02-03 VITALS — BP 135/85 | HR 104 | Temp 98.1°F | Resp 16 | Wt 279.2 lb

## 2022-02-03 DIAGNOSIS — G8929 Other chronic pain: Secondary | ICD-10-CM | POA: Insufficient documentation

## 2022-02-03 DIAGNOSIS — F17200 Nicotine dependence, unspecified, uncomplicated: Secondary | ICD-10-CM | POA: Insufficient documentation

## 2022-02-03 DIAGNOSIS — I10 Essential (primary) hypertension: Secondary | ICD-10-CM | POA: Diagnosis present

## 2022-02-03 DIAGNOSIS — M545 Low back pain, unspecified: Secondary | ICD-10-CM

## 2022-02-03 DIAGNOSIS — Z79899 Other long term (current) drug therapy: Secondary | ICD-10-CM | POA: Diagnosis not present

## 2022-02-03 MED ORDER — GABAPENTIN 300 MG PO CAPS: 300.0000 mg | ORAL_CAPSULE | Freq: Three times a day (TID) | ORAL | 3 refills | Status: DC

## 2022-02-03 MED ORDER — METHOCARBAMOL 500 MG PO TABS: 500.0000 mg | ORAL_TABLET | Freq: Three times a day (TID) | ORAL | 1 refills | Status: DC | PRN
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
## 2022-02-03 NOTE — Telephone Encounter (Signed)
Copied from [place] [phone]. Topic: General - Other >> Feb 03, 2022  2:25 PM Leilani Able wrote: Reason for CRM: Requested specific for Zelda  gabapentin (NEURONTIN) 300 MG cap Pt mother, Vedanth Sirico, contact but no DPR is calling out of concern for pt is taking more gabapentin than script and even sneaking hers which she has hidden. 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 She wants him to have medication for his pain but not something that is so addictive. Her fu # is [phone]. I did not promise a cb and gave no info, as no DPR but I think she is just trying to be helpful

## 2022-02-04 LAB — CMP14+EGFR
ALT: 11 IU/L (ref 0–44)
AST: 16 IU/L (ref 0–40)
Albumin/Globulin Ratio: 1.8 (ref 1.2–2.2)
Albumin: 4.8 g/dL (ref 4.0–5.0)
Alkaline Phosphatase: 102 IU/L (ref 44–121)
BUN/Creatinine Ratio: 7 — ABNORMAL LOW (ref 9–20)
BUN: 6 mg/dL (ref 6–20)
Bilirubin Total: 0.4 mg/dL (ref 0.0–1.2)
CO2: 21 mmol/L (ref 20–29)
Calcium: 10.3 mg/dL — ABNORMAL HIGH (ref 8.7–10.2)
Chloride: 96 mmol/L (ref 96–106)
Creatinine, Ser: 0.9 mg/dL (ref 0.76–1.27)
Globulin, Total: 2.7 g/dL (ref 1.5–4.5)
Glucose: 92 mg/dL (ref 70–99)
Potassium: 4.5 mmol/L (ref 3.5–5.2)
Sodium: 134 mmol/L (ref 134–144)
Total Protein: 7.5 g/dL (ref 6.0–8.5)
eGFR: 113 mL/min/{1.73_m2} (ref >59)

## 2022-02-09 NOTE — Telephone Encounter (Signed)
fyi

## 2022-05-24 ENCOUNTER — Other Ambulatory Visit: Payer: Self-pay | Admitting: Nurse Practitioner

## 2022-05-24 DIAGNOSIS — F17200 Nicotine dependence, unspecified, uncomplicated: Secondary | ICD-10-CM

## 2022-05-25 NOTE — Telephone Encounter (Signed)
Requested Prescriptions  Pending Prescriptions Disp Refills  . VENTOLIN HFA 108 (90 Base) MCG/ACT inhaler [Pharmacy Med Name: Ventolin HFA 108 (90 Base) MCG/ACT Inhalation Aerosol Solution] 18 g 0    Sig: INHALE 2 PUFFS BY MOUTH EVERY 6 HOURS AS NEEDED FOR WHEEZING FOR SHORTNESS OF BREATH     Pulmonology:  Beta Agonists 2 Passed - 05/24/2022  1:15 PM      Passed - Last BP in normal range    BP Readings from Last 1 Encounters:  02/03/22 135/85         Passed - Last Heart Rate in normal range    Pulse Readings from Last 1 Encounters:  02/03/22 (!) 104         Passed - Valid encounter within last 12 months    Recent Outpatient Visits          3 months ago Essential hypertension   [place], Vernia Buff, NP   6 months ago Chronic lumbosacral pain   [place], Charlane Ferretti, MD   9 months ago Primary hypertension   [place], Vernia Buff, NP   1 year ago Encounter for annual physical exam   [place], Vernia Buff, NP   1 year ago Essential hypertension   [place], Vernia Buff, NP      Future Appointments            In 2 months Gildardo Pounds, NP [place]

## 2022-05-28 ENCOUNTER — Ambulatory Visit: Payer: Self-pay

## 2022-05-28 NOTE — Telephone Encounter (Signed)
  Chief Complaint: wheezing Symptoms: SOB, wheezing, coughing Frequency: 1-2 weeks Pertinent Negatives: NA Disposition: '[]'$ ED /'[x]'$ Urgent Care (no appt availability in office) / '[]'$ Appointment(In office/virtual)/ '[]'$  [place] [facility]/ '[]'$ Home Care/ '[]'$ Refused Recommended Disposition /'[]'$ [facility] [facility]/ '[]'$  Follow-up with PCP Additional Notes: spoke with pt's mother, she states pt is coughing and wheezing more constant and using his inhaler and not helping. She states that he is still smoking as well. Advised no soon appts with practice and advised pt could go to UC to be seen. Was able to schedule appt with UC in [place] tomorrow at 1100.  Reason for Disposition  [1] MILD difficulty breathing (e.g., minimal/no SOB at rest, SOB with walking, pulse <100) AND [2] NEW-onset or WORSE than normal  Answer Assessment - Initial Assessment Questions 1. RESPIRATORY STATUS: "Describe your breathing?" (e.g., wheezing, shortness of breath, unable to speak, severe coughing)      Wheezing and coughing  2. ONSET: "When did this breathing problem begin?"      1-2 weeks  3. PATTERN "Does the difficult breathing come and go, or has it been constant since it started?"      Constant  4. SEVERITY: "How bad is your breathing?" (e.g., mild, moderate, severe)    - MILD: No SOB at rest, mild SOB with walking, speaks normally in sentences, can lie down, no retractions, pulse < 100.    - MODERATE: SOB at rest, SOB with minimal exertion and prefers to sit, cannot lie down flat, speaks in phrases, mild retractions, audible wheezing, pulse 100-120.    - SEVERE: Very SOB at rest, speaks in single words, struggling to breathe, sitting hunched forward, retractions, pulse > 120      Mild to moderate  9. OTHER SYMPTOMS: "Do you have any other symptoms? (e.g., dizziness, runny nose, cough, chest pain, fever)     Pt is also a smoker  Protocols used: Breathing Difficulty-A-AH

## 2022-05-29 ENCOUNTER — Ambulatory Visit: Payer: Self-pay

## 2022-06-11 ENCOUNTER — Other Ambulatory Visit: Payer: Self-pay | Admitting: Nurse Practitioner

## 2022-06-11 DIAGNOSIS — K219 Gastro-esophageal reflux disease without esophagitis: Secondary | ICD-10-CM

## 2022-06-11 NOTE — Telephone Encounter (Signed)
Requested Prescriptions  Pending Prescriptions Disp Refills  . omeprazole (PRILOSEC) 20 MG capsule [Pharmacy Med Name: Omeprazole 20 MG Oral Capsule Delayed Release] 90 capsule 0    Sig: Take 1 capsule by mouth once daily     Gastroenterology: Proton Pump Inhibitors Passed - 06/11/2022 11:00 AM      Passed - Valid encounter within last 12 months    Recent Outpatient Visits          4 months ago Essential hypertension   [place], Vernia Buff, NP   7 months ago Chronic lumbosacral pain   [place], [place], MD   10 months ago Primary hypertension   [place], Vernia Buff, NP   1 year ago Encounter for annual physical exam   [place], Vernia Buff, NP   1 year ago Essential hypertension   [place], Vernia Buff, NP      Future Appointments            In 1 month Gildardo Pounds, NP [place]

## 2022-06-18 ENCOUNTER — Encounter: Payer: Self-pay | Admitting: Nurse Practitioner

## 2022-08-06 ENCOUNTER — Ambulatory Visit: Payer: Medicaid Other | Attending: Nurse Practitioner | Admitting: Nurse Practitioner

## 2022-08-06 ENCOUNTER — Encounter: Payer: Self-pay | Admitting: Nurse Practitioner

## 2022-08-06 VITALS — BP 134/86 | HR 94 | Ht 74.0 in | Wt 292.2 lb

## 2022-08-06 DIAGNOSIS — Z23 Encounter for immunization: Secondary | ICD-10-CM | POA: Insufficient documentation

## 2022-08-06 DIAGNOSIS — Z6837 Body mass index (BMI) 37.0-37.9, adult: Secondary | ICD-10-CM | POA: Diagnosis not present

## 2022-08-06 DIAGNOSIS — R635 Abnormal weight gain: Secondary | ICD-10-CM | POA: Insufficient documentation

## 2022-08-06 DIAGNOSIS — E7800 Pure hypercholesterolemia, unspecified: Secondary | ICD-10-CM | POA: Diagnosis not present

## 2022-08-06 DIAGNOSIS — I10 Essential (primary) hypertension: Secondary | ICD-10-CM | POA: Diagnosis present

## 2022-08-06 DIAGNOSIS — Z833 Family history of diabetes mellitus: Secondary | ICD-10-CM | POA: Insufficient documentation

## 2022-08-06 DIAGNOSIS — R7989 Other specified abnormal findings of blood chemistry: Secondary | ICD-10-CM | POA: Insufficient documentation

## 2022-08-06 DIAGNOSIS — Z5181 Encounter for therapeutic drug level monitoring: Secondary | ICD-10-CM | POA: Diagnosis not present

## 2022-08-06 NOTE — Progress Notes (Signed)
Mother, Margaretha Sheffield Mother has concerns, A1c, and weight gain.

## 2022-08-06 NOTE — Progress Notes (Signed)
 Assessment & Plan:  Brian [name] was seen today for hypertension.  Diagnoses and all orders for this visit:  Essential hypertension -     CMP14+EGFR  Family history of diabetes mellitus in father -     Hemoglobin A1c  Weight gain -     Thyroid Panel With TSH  Encounter for therapeutic drug level monitoring -     Valproic acid level  Hypercholesterolemia -     Lipid panel  Abnormal CBC -     CBC with Differential  Need for immunization against influenza -     Flu Vaccine QUAD 6mo+IM (Fluarix, Fluzone & Alfiuria Quad PF)    Patient has been counseled on age-appropriate routine health concerns for screening and prevention. These are reviewed and up-to-date. Referrals have been placed accordingly. Immunizations are up-to-date or declined.    Subjective:   Chief Complaint  Patient presents with   Hypertension   HPI Brian [name] 36 y.o. male presents to office today for follow-up to hypertension.  He is accompanied by his mother today.  She is concerned about his recent weight gain and questions whether he has diabetes or a thyroid disorder.  He does have a family history of Diabetes.  Weight is up 13 pounds since June.  He is not exercising. Usually sleeps a lot during the day and eats often throughout the day. Farmer himself does not have any concerns today  HTN Blood pressure is controlled with amlodipine 10 mg daily. BP Readings from Last 3 Encounters:  08/06/22 134/86  02/03/22 135/85  11/03/21 127/82    LIPID He is taking atorvastatin 20 mg daily as prescribed.  Will check a fasting lipid panel today and based on risk score may need to increase atorvastatin.  He is a smoker and has a significant family history of cerebrovascular disease. Lab Results  Component Value Date   CHOL 136 05/04/2021   CHOL 235 (H) 12/03/2020   CHOL 233 (H) 03/05/2020   Lab Results  Component Value Date   HDL 41 05/04/2021   HDL 41 12/03/2020   HDL 45 03/05/2020   Lab Results   Component Value Date   LDLCALC 67 05/04/2021   LDLCALC 145 (H) 12/03/2020   LDLCALC 136 (H) 03/05/2020   Lab Results  Component Value Date   TRIG 163 (H) 05/04/2021   TRIG 271 (H) 12/03/2020   TRIG 292 (H) 03/05/2020   Lab Results  Component Value Date   CHOLHDL 3.3 05/04/2021   CHOLHDL 5.7 (H) 12/03/2020   CHOLHDL 5.2 (H) 03/05/2020     Review of Systems  Constitutional:  Negative for fever, malaise/fatigue and weight loss.  HENT: Negative.  Negative for nosebleeds.   Eyes: Negative.  Negative for blurred vision, double vision and photophobia.  Respiratory: Negative.  Negative for cough and shortness of breath.   Cardiovascular: Negative.  Negative for chest pain, palpitations and leg swelling.  Gastrointestinal: Negative.  Negative for heartburn, nausea and vomiting.  Musculoskeletal: Negative.  Negative for myalgias.  Neurological: Negative.  Negative for dizziness, focal weakness, seizures and headaches.  Psychiatric/Behavioral: Negative.  Negative for suicidal ideas.     Past Medical History:  Diagnosis Date   Anxiety    Bipolar 1 disorder (HCC)    Depression    Hepatitis C    Hypertension    PTSD (post-traumatic stress disorder)     Past Surgical History:  Procedure Laterality Date   HIP SURGERY      Family History  Problem Relation   Age of Onset   Stroke Mother    Hypertension Father    Diabetes Father    Stroke Maternal Grandmother    Diabetes Paternal Grandmother     Social History Reviewed with no changes to be made today.   Outpatient Medications Prior to Visit  Medication Sig Dispense Refill   amLODipine (NORVASC) 10 MG tablet Take 1 tablet (10 mg total) by mouth daily. 90 tablet 1   atorvastatin (LIPITOR) 20 MG tablet Take 1 tablet (20 mg total) by mouth daily. 90 tablet 1   benztropine (COGENTIN) 0.5 MG tablet Take 1 Tablet By mouth 2 times per day 60 tablet 3   divalproex (DEPAKOTE ER) 500 MG 24 hr tablet TAKE 2 TABLET BY ORAL ROUTE PER AT  BEDTIME 60 tablet 2   FLUoxetine (PROZAC) 20 MG capsule Take 1 Capsule By Oral Route 1 time per day 30 capsule 2   haloperidol (HALDOL) 10 MG tablet Take 1 Tablet By Oral Route 3 times per day 90 tablet 2   omeprazole (PRILOSEC) 20 MG capsule Take 1 capsule by mouth once daily 90 capsule 0   OVER THE COUNTER MEDICATION Take 1 Dose by mouth as needed (anxiety).     prazosin (MINIPRESS) 1 MG capsule TAKE 1 CAPSULE BY ORAL ROUTE PER AT BEDTIME 30 capsule 2   QUEtiapine (SEROQUEL) 25 MG tablet TAKE 1 TABLET BY MOUTH AT BEDTIME AS NEEDED FOR SLEEP. 30 tablet 2   ramelteon (ROZEREM) 8 MG tablet TAKE 1 TABLET BY ORAL ROUTE 1 TIME PER DAY AT BEDTIME 30 tablet 2   VENTOLIN HFA 108 (90 Base) MCG/ACT inhaler INHALE 2 PUFFS BY MOUTH EVERY 6 HOURS AS NEEDED FOR WHEEZING FOR SHORTNESS OF BREATH 18 g 0   Blood Pressure Monitor DEVI Please provide patient with insurance approved blood pressure monitor 1 Device 0   NEOMYCIN-POLYMYXIN-HYDROCORTISONE (CORTISPORIN) 1 % SOLN OTIC solution Place 3 drops into the right ear every 8 (eight) hours. 10 mL 0   neomycin-polymyxin-pramoxine (NEOSPORIN PLUS) 1 % cream Apply topically 2 (two) times daily. 14.2 g 0   gabapentin (NEURONTIN) 300 MG capsule Take 1 capsule (300 mg total) by mouth 3 (three) times daily. 90 capsule 3   methocarbamol (ROBAXIN) 500 MG tablet Take 1 tablet (500 mg total) by mouth every 8 (eight) hours as needed for muscle spasms. 60 tablet 1   naltrexone (DEPADE) 50 MG tablet Take 1 Tablet By mouth 1 time per day 30 tablet 3   No facility-administered medications prior to visit.    Allergies  Allergen Reactions   Phenytoin Sodium Extended Other (See Comments)    Gets sick to his stomach, and closes throat up   Pineapple Anaphylaxis   Risperdal [Risperidone] Shortness Of Breath    'throat closes up'       Objective:    BP 134/86   Pulse 94   Ht 6' 2" (1.88 m)   Wt 292 lb 3.2 oz (132.5 kg)   SpO2 96%   BMI 37.52 kg/m  Wt Readings from  Last 3 Encounters:  08/06/22 292 lb 3.2 oz (132.5 kg)  02/03/22 279 lb 3.2 oz (126.6 kg)  11/03/21 275 lb (124.7 kg)    Physical Exam Vitals and nursing note reviewed.  Constitutional:      Appearance: He is well-developed.  HENT:     Head: Normocephalic and atraumatic.  Cardiovascular:     Rate and Rhythm: Normal rate and regular rhythm.     Heart sounds: Normal heart  sounds. No murmur heard.    No friction rub. No gallop.  Pulmonary:     Effort: Pulmonary effort is normal. No tachypnea or respiratory distress.     Breath sounds: Normal breath sounds. No decreased breath sounds, wheezing, rhonchi or rales.  Chest:     Chest wall: No tenderness.  Abdominal:     General: Bowel sounds are normal.     Palpations: Abdomen is soft.  Musculoskeletal:        General: Normal range of motion.     Cervical back: Normal range of motion.  Skin:    General: Skin is warm and dry.  Neurological:     Mental Status: He is alert and oriented to person, place, and time.     Coordination: Coordination normal.  Psychiatric:        Behavior: Behavior normal. Behavior is cooperative.        Thought Content: Thought content normal.        Judgment: Judgment normal.          Patient has been counseled extensively about nutrition and exercise as well as the importance of adherence with medications and regular follow-up. The patient was given clear instructions to go to ER or return to medical center if symptoms don't improve, worsen or new problems develop. The patient verbalized understanding.   Follow-up: No follow-ups on file.    W , FNP-BC  [facility] [place], [place] [phone]   08/06/2022, 11:27 AM 

## 2022-08-07 LAB — CMP14+EGFR
ALT: 15 IU/L (ref 0–44)
AST: 16 IU/L (ref 0–40)
Albumin/Globulin Ratio: 2.1 (ref 1.2–2.2)
Albumin: 4.8 g/dL (ref 4.1–5.1)
Alkaline Phosphatase: 94 IU/L (ref 44–121)
BUN/Creatinine Ratio: 13 (ref 9–20)
BUN: 11 mg/dL (ref 6–20)
Bilirubin Total: 0.2 mg/dL (ref 0.0–1.2)
CO2: 19 mmol/L — ABNORMAL LOW (ref 20–29)
Calcium: 10.1 mg/dL (ref 8.7–10.2)
Chloride: 99 mmol/L (ref 96–106)
Creatinine, Ser: 0.88 mg/dL (ref 0.76–1.27)
Globulin, Total: 2.3 g/dL (ref 1.5–4.5)
Glucose: 111 mg/dL — ABNORMAL HIGH (ref 70–99)
Potassium: 4.4 mmol/L (ref 3.5–5.2)
Sodium: 135 mmol/L (ref 134–144)
Total Protein: 7.1 g/dL (ref 6.0–8.5)
eGFR: 113 mL/min/{1.73_m2} (ref >59)

## 2022-08-07 LAB — LIPID PANEL
Chol/HDL Ratio: 3.9 ratio (ref 0.0–5.0)
Cholesterol, Total: 134 mg/dL (ref 100–199)
HDL: 34 mg/dL — ABNORMAL LOW (ref >39)
LDL Chol Calc (NIH): 77 mg/dL (ref 0–99)
Triglycerides: 126 mg/dL (ref 0–149)
VLDL Cholesterol Cal: 23 mg/dL (ref 5–40)

## 2022-08-07 LAB — CBC WITH DIFFERENTIAL/PLATELET
Basophils Absolute: 0 10*3/uL (ref 0.0–0.2)
Basos: 1 %
EOS (ABSOLUTE): 0.2 10*3/uL (ref 0.0–0.4)
Eos: 3 %
Hematocrit: 45.4 % (ref 37.5–51.0)
Hemoglobin: 15.3 g/dL (ref 13.0–17.7)
Immature Grans (Abs): 0 10*3/uL (ref 0.0–0.1)
Immature Granulocytes: 0 %
Lymphocytes Absolute: 2 10*3/uL (ref 0.7–3.1)
Lymphs: 32 %
MCH: 29 pg (ref 26.6–33.0)
MCHC: 33.7 g/dL (ref 31.5–35.7)
MCV: 86 fL (ref 79–97)
Monocytes Absolute: 0.6 10*3/uL (ref 0.1–0.9)
Monocytes: 10 %
Neutrophils Absolute: 3.4 10*3/uL (ref 1.4–7.0)
Neutrophils: 54 %
Platelets: 325 10*3/uL (ref 150–450)
RBC: 5.27 x10E6/uL (ref 4.14–5.80)
RDW: 12.4 % (ref 11.6–15.4)
WBC: 6.2 10*3/uL (ref 3.4–10.8)

## 2022-08-07 LAB — HEMOGLOBIN A1C
Est. average glucose Bld gHb Est-mCnc: 117 mg/dL
Hgb A1c MFr Bld: 5.7 % — ABNORMAL HIGH (ref 4.8–5.6)

## 2022-08-07 LAB — THYROID PANEL WITH TSH
Free Thyroxine Index: 2.5 (ref 1.2–4.9)
T3 Uptake Ratio: 27 % (ref 24–39)
T4, Total: 9.4 ug/dL (ref 4.5–12.0)
TSH: 1.47 u[IU]/mL (ref 0.450–4.500)

## 2022-08-07 LAB — VALPROIC ACID LEVEL: Valproic Acid Lvl: 71 ug/mL (ref 50–100)

## 2022-08-20 ENCOUNTER — Other Ambulatory Visit: Payer: Self-pay | Admitting: Family Medicine

## 2022-08-20 ENCOUNTER — Other Ambulatory Visit: Payer: Self-pay | Admitting: Nurse Practitioner

## 2022-08-20 DIAGNOSIS — E783 Hyperchylomicronemia: Secondary | ICD-10-CM

## 2022-08-20 DIAGNOSIS — K219 Gastro-esophageal reflux disease without esophagitis: Secondary | ICD-10-CM

## 2022-10-15 ENCOUNTER — Ambulatory Visit: Admit: 2022-10-15 | Payer: Medicaid Other

## 2022-10-15 ENCOUNTER — Encounter: Payer: Self-pay | Admitting: Emergency Medicine

## 2022-10-15 ENCOUNTER — Ambulatory Visit
Admission: EM | Admit: 2022-10-15 | Discharge: 2022-10-15 | Disposition: A | Discharge status: Home / Self Care | Payer: Medicaid Other | Attending: Urgent Care | Admitting: Urgent Care

## 2022-10-15 ENCOUNTER — Encounter: Payer: Self-pay | Admitting: Nurse Practitioner

## 2022-10-15 ENCOUNTER — Ambulatory Visit: Payer: Self-pay

## 2022-10-15 DIAGNOSIS — L02216 Cutaneous abscess of umbilicus: Secondary | ICD-10-CM | POA: Diagnosis not present

## 2022-10-15 DIAGNOSIS — K429 Umbilical hernia without obstruction or gangrene: Secondary | ICD-10-CM | POA: Diagnosis not present

## 2022-10-15 MED ORDER — AMOXICILLIN-POT CLAVULANATE 875-125 MG PO TABS: 1.0000 | ORAL_TABLET | Freq: Two times a day (BID) | ORAL | 0 refills | Status: DC

## 2022-10-15 MED ORDER — NAPROXEN 500 MG PO TABS
500.0000 mg | ORAL_TABLET | Freq: Two times a day (BID) | ORAL | 0 refills | Status: DC
Start: 2022-10-15 — End: 2022-11-30

## 2022-10-15 NOTE — Discharge Instructions (Signed)
Do not apply any ointments or creams. Make sure that you are pressing on the area to get pus to come out.  Try your best to clean the wound with antibacterial soap and warm water. Pat your wound dry and let it air out if possible.  Start Augmentin for the infection, umbilical abscess. Use naproxen for the pain. Follow up with [facility] for a recheck and consideration for a CT scan as deemed necessary. If you worsen please go to the emergency room.

## 2022-10-15 NOTE — ED Triage Notes (Signed)
Patient c/o drainage or blood from his belly button yesterday.  No injury.  Patient states that he does have a hernia.  Painful.

## 2022-10-15 NOTE — ED Provider Notes (Signed)
[facility] - URGENT [facility]  Note:  This document was prepared using Systems analyst and may include unintentional dictation errors.  MRN: [id number] DOB: [birth year]  Subjective:   Brian [name] is a 37 y.o. male presenting for 1 day history of recurrent drainage and bleeding from the umbilicus.  Has a history of an umbilical hernia.  Has previously had an infection in the same area.  Last episode was April 2022.  No current facility-administered medications for this encounter.  Current Outpatient Medications:    amLODipine (NORVASC) 10 MG tablet, Take 1 tablet (10 mg total) by mouth daily., Disp: 90 tablet, Rfl: 1   atorvastatin (LIPITOR) 20 MG tablet, Take 1 tablet by mouth once daily, Disp: 90 tablet, Rfl: 1   benztropine (COGENTIN) 0.5 MG tablet, Take 1 Tablet By mouth 2 times per day, Disp: 60 tablet, Rfl: 3   Blood Pressure Monitor DEVI, Please provide patient with insurance approved blood pressure monitor, Disp: 1 Device, Rfl: 0   divalproex (DEPAKOTE ER) 500 MG 24 hr tablet, TAKE 2 TABLET BY ORAL ROUTE PER AT BEDTIME, Disp: 60 tablet, Rfl: 2   FLUoxetine (PROZAC) 20 MG capsule, Take 1 Capsule By Oral Route 1 time per day, Disp: 30 capsule, Rfl: 2   haloperidol (HALDOL) 10 MG tablet, Take 1 Tablet By Oral Route 3 times per day, Disp: 90 tablet, Rfl: 2   NEOMYCIN-POLYMYXIN-HYDROCORTISONE (CORTISPORIN) 1 % SOLN OTIC solution, Place 3 drops into the right ear every 8 (eight) hours., Disp: 10 mL, Rfl: 0   neomycin-polymyxin-pramoxine (NEOSPORIN PLUS) 1 % cream, Apply topically 2 (two) times daily., Disp: 14.2 g, Rfl: 0   omeprazole (PRILOSEC) 20 MG capsule, Take 1 capsule by mouth once daily, Disp: 90 capsule, Rfl: 1   OVER THE COUNTER MEDICATION, Take 1 Dose by mouth as needed (anxiety)., Disp: , Rfl:    prazosin (MINIPRESS) 1 MG capsule, TAKE 1 CAPSULE BY ORAL ROUTE PER AT BEDTIME, Disp: 30 capsule, Rfl: 2   QUEtiapine (SEROQUEL) 25 MG tablet, TAKE 1 TABLET  BY MOUTH AT BEDTIME AS NEEDED FOR SLEEP., Disp: 30 tablet, Rfl: 2   ramelteon (ROZEREM) 8 MG tablet, TAKE 1 TABLET BY ORAL ROUTE 1 TIME PER DAY AT BEDTIME, Disp: 30 tablet, Rfl: 2   VENTOLIN HFA 108 (90 Base) MCG/ACT inhaler, INHALE 2 PUFFS BY MOUTH EVERY 6 HOURS AS NEEDED FOR WHEEZING FOR SHORTNESS OF BREATH, Disp: 18 g, Rfl: 0   Allergies  Allergen Reactions   Phenytoin Sodium Extended Other (See Comments)    Gets sick to his stomach, and closes throat up   Pineapple Anaphylaxis   Risperdal [Risperidone] Shortness Of Breath    'throat closes up'    Past Medical History:  Diagnosis Date   Anxiety    Bipolar 1 disorder (HCC)    Depression    Hepatitis C    Hypertension    PTSD (post-traumatic stress disorder)      Past Surgical History:  Procedure Laterality Date   HIP SURGERY      Family History  Problem Relation Age of Onset   Stroke Mother    Hypertension Father    Diabetes Father    Stroke Maternal Grandmother    Diabetes Paternal Grandmother     Social History   Tobacco Use   Smoking status: Every Day    Packs/day: 1.00    Types: Cigarettes   Smokeless tobacco: Former  Scientific laboratory technician Use: Some days  Substance Use Topics  Alcohol use: Not Currently    Comment: one beer a day   Drug use: Not Currently    Types: Methamphetamines, Heroin, Marijuana, IV, Solvent inhalants    Comment: "huffs" air canisters ; CBD    ROS   Objective:   Vitals: BP (!) 136/95 (BP Location: Left Arm)   Pulse 93   Temp 98 F (36.7 C) (Oral)   Resp 18   Ht 6' (1.829 m)   Wt 298 lb (135.2 kg)   SpO2 96%   BMI 40.42 kg/m   Physical Exam Constitutional:      General: He is not in acute distress.    Appearance: Normal appearance. He is well-developed and normal weight. He is not ill-appearing, toxic-appearing or diaphoretic.  HENT:     Head: Normocephalic and atraumatic.     Right Ear: External ear normal.     Left Ear: External ear normal.     Nose: Nose  normal.     Mouth/Throat:     Pharynx: Oropharynx is clear.  Eyes:     General: No scleral icterus.       Right eye: No discharge.        Left eye: No discharge.     Extraocular Movements: Extraocular movements intact.  Cardiovascular:     Rate and Rhythm: Normal rate.  Pulmonary:     Effort: Pulmonary effort is normal.  Abdominal:     General: Bowel sounds are normal. There is no distension.     Palpations: Abdomen is soft. There is no mass.     Tenderness: There is no abdominal tenderness. There is no right CVA tenderness, left CVA tenderness, guarding or rebound.    Musculoskeletal:     Cervical back: Normal range of motion.  Neurological:     Mental Status: He is alert and oriented to person, place, and time.  Psychiatric:        Mood and Affect: Mood normal.        Behavior: Behavior normal.        Thought Content: Thought content normal.        Judgment: Judgment normal.     Assessment and Plan :   PDMP not reviewed this encounter.  1. Abscess, umbilical   2. Umbilical hernia without obstruction and without gangrene     Recommended starting Augmentin for anaerobic coverage.  Apply compression, naproxen for pain and inflammation.  Will defer ER visit for now.  Recommended close follow-up with [facility].  No signs of an acute abdomen. Counseled patient on potential for adverse effects with medications prescribed/recommended today, ER and return-to-clinic precautions discussed, patient verbalized understanding.    Jaynee Eagles, PA-C 10/15/22 1311

## 2022-10-15 NOTE — Telephone Encounter (Signed)
Call placed to patient and mother was informed to take patient to Urgent Care to get evaluated.

## 2022-10-15 NOTE — Telephone Encounter (Signed)
     Chief Complaint: Umbilical hernia pain, "bleeding from area." Asking to be worked in. Refuses ED. Symptoms: Pain Frequency: fever Pertinent Negatives: Patient denies fever Disposition: []$ ED /[]$ Urgent Care (no appt availability in office) / []$ Appointment(In office/virtual)/ []$  [facility]/ []$ Home Care/ []$ Refused Recommended Disposition /[]$ [facility]/ []$  Follow-up with PCP Additional Notes: Please advise pt.   Answer Assessment - Initial Assessment Questions 1. ONSET:  "When did this first appear?"     1 year, getting worse 2. APPEARANCE: "What does it look like?"     Bulge 3. SIZE: "How big is it?" (inches, cm or compare to coins, fruit)     Unsure 4. LOCATION: "Where exactly is the hernia located?"     Belly button 5. PATTERN: "Does the swelling come and go, or has it been constant since it started?"     Comes and goes 6. PAIN: "Is there any pain?" If Yes, ask: "How bad is it?"  (Scale 1-10; or mild, moderate, severe)    - MILD (1-3): Doesn't interfere with normal activities, abdomen soft and not tender to touch.     - MODERATE (4-7): Interferes with normal activities or awakens from sleep, abdomen tender to touch.     - SEVERE (8-10): Excruciating pain, doubled over, unable to do any normal activities.       Moderate 7. DIAGNOSIS: "Have you been seen by a doctor (or NP/PA) for this?" "Did the doctor diagnose you as having a hernia?"     Yes 8. OTHER SYMPTOMS: "Do you have any other symptoms?" (e.g., fever, abdomen pain, vomiting)     No 9. PREGNANCY: "Is there any chance you are pregnant?" "When was your last menstrual period?"     N/a  Protocols used: Hernia-A-AH

## 2022-10-21 ENCOUNTER — Other Ambulatory Visit: Payer: Self-pay | Admitting: Nurse Practitioner

## 2022-10-21 DIAGNOSIS — F17200 Nicotine dependence, unspecified, uncomplicated: Secondary | ICD-10-CM

## 2022-11-01 ENCOUNTER — Ambulatory Visit: Payer: Self-pay | Admitting: Surgery

## 2022-11-01 DIAGNOSIS — K429 Umbilical hernia without obstruction or gangrene: Secondary | ICD-10-CM | POA: Diagnosis not present

## 2022-11-30 ENCOUNTER — Encounter (HOSPITAL_BASED_OUTPATIENT_CLINIC_OR_DEPARTMENT_OTHER): Payer: Self-pay | Admitting: Surgery

## 2022-11-30 ENCOUNTER — Other Ambulatory Visit: Payer: Self-pay

## 2022-12-01 ENCOUNTER — Encounter (HOSPITAL_BASED_OUTPATIENT_CLINIC_OR_DEPARTMENT_OTHER)
Admission: RE | Admit: 2022-12-01 | Discharge: 2022-12-01 | Disposition: A | Discharge status: Home / Self Care | Payer: 59 | Source: Ambulatory Visit | Attending: Surgery | Admitting: Surgery

## 2022-12-01 DIAGNOSIS — I10 Essential (primary) hypertension: Secondary | ICD-10-CM | POA: Insufficient documentation

## 2022-12-01 DIAGNOSIS — Z01810 Encounter for preprocedural cardiovascular examination: Secondary | ICD-10-CM | POA: Diagnosis not present

## 2022-12-01 NOTE — Progress Notes (Signed)

## 2022-12-06 ENCOUNTER — Other Ambulatory Visit: Payer: Self-pay | Admitting: Nurse Practitioner

## 2022-12-06 DIAGNOSIS — F17200 Nicotine dependence, unspecified, uncomplicated: Secondary | ICD-10-CM

## 2022-12-08 ENCOUNTER — Ambulatory Visit (HOSPITAL_BASED_OUTPATIENT_CLINIC_OR_DEPARTMENT_OTHER): Admission: RE | Admit: 2022-12-08 | Payer: 59 | Source: Home / Self Care | Admitting: Surgery

## 2022-12-08 DIAGNOSIS — I10 Essential (primary) hypertension: Secondary | ICD-10-CM

## 2022-12-08 HISTORY — DX: Unspecified asthma, uncomplicated: J45.909

## 2022-12-08 HISTORY — DX: Other psychoactive substance abuse, uncomplicated: F19.10

## 2022-12-08 HISTORY — DX: Umbilical hernia without obstruction or gangrene: K42.9

## 2022-12-08 SURGERY — REPAIR, HERNIA, UMBILICAL, ADULT
Anesthesia: General

## 2022-12-26 ENCOUNTER — Other Ambulatory Visit: Payer: Self-pay | Admitting: Family Medicine

## 2022-12-26 DIAGNOSIS — F17200 Nicotine dependence, unspecified, uncomplicated: Secondary | ICD-10-CM

## 2023-01-07 ENCOUNTER — Other Ambulatory Visit: Payer: Self-pay | Admitting: Family Medicine

## 2023-01-07 DIAGNOSIS — I10 Essential (primary) hypertension: Secondary | ICD-10-CM

## 2023-01-07 NOTE — Telephone Encounter (Signed)
Requested medications are due for refill today.  yes  Requested medications are on the active medications list.  yes  Last refill. 11/03/2021 #90 1rf  Future visit scheduled.   yes  Notes to clinic.  Rx written to expired 11/30/2022 - Rx is expired.    Requested Prescriptions  Pending Prescriptions Disp Refills   amLODipine (NORVASC) 10 MG tablet [Pharmacy Med Name: amLODIPine Besylate 10 MG Oral Tablet] 90 tablet 0    Sig: Take 1 tablet by mouth once daily     Cardiovascular: Calcium Channel Blockers 2 Failed - 01/07/2023 11:17 AM      Failed - Last BP in normal range    BP Readings from Last 1 Encounters:  10/15/22 (!) 136/95         Passed - Last Heart Rate in normal range    Pulse Readings from Last 1 Encounters:  10/15/22 93         Passed - Valid encounter within last 6 months    Recent Outpatient Visits           5 months ago Essential hypertension   [facility] [place], Shea Stakes, NP   11 months ago Essential hypertension   [facility] [place], Shea Stakes, NP   1 year ago Chronic lumbosacral pain    [facility] Register, MD   1 year ago Primary hypertension    [facility] [place], Shea Stakes, NP   1 year ago Encounter for annual physical exam   [facility] [place], Shea Stakes, NP       Future Appointments             In 2 weeks Claiborne Rigg, NP [facility]

## 2023-01-24 ENCOUNTER — Encounter: Payer: Self-pay | Admitting: Nurse Practitioner

## 2023-01-24 ENCOUNTER — Ambulatory Visit: Payer: 59 | Attending: Nurse Practitioner | Admitting: Nurse Practitioner

## 2023-01-24 VITALS — BP 129/87 | HR 95 | Ht 72.0 in | Wt 324.6 lb

## 2023-01-24 DIAGNOSIS — F17200 Nicotine dependence, unspecified, uncomplicated: Secondary | ICD-10-CM

## 2023-01-24 DIAGNOSIS — Z5181 Encounter for therapeutic drug level monitoring: Secondary | ICD-10-CM | POA: Diagnosis not present

## 2023-01-24 DIAGNOSIS — E783 Hyperchylomicronemia: Secondary | ICD-10-CM | POA: Diagnosis not present

## 2023-01-24 DIAGNOSIS — I10 Essential (primary) hypertension: Secondary | ICD-10-CM

## 2023-01-24 DIAGNOSIS — R7303 Prediabetes: Secondary | ICD-10-CM | POA: Diagnosis not present

## 2023-01-24 DIAGNOSIS — R7989 Other specified abnormal findings of blood chemistry: Secondary | ICD-10-CM

## 2023-01-24 DIAGNOSIS — K219 Gastro-esophageal reflux disease without esophagitis: Secondary | ICD-10-CM

## 2023-01-24 DIAGNOSIS — E7800 Pure hypercholesterolemia, unspecified: Secondary | ICD-10-CM | POA: Diagnosis not present

## 2023-01-24 MED ORDER — ATORVASTATIN CALCIUM 20 MG PO TABS
20.0000 mg | ORAL_TABLET | Freq: Every day | ORAL | 1 refills | Status: AC
Start: 2023-01-24 — End: ?

## 2023-01-24 MED ORDER — AMLODIPINE BESYLATE 10 MG PO TABS
10.0000 mg | ORAL_TABLET | Freq: Every day | ORAL | 1 refills | Status: AC
Start: 2023-01-24 — End: ?

## 2023-01-24 MED ORDER — OMEPRAZOLE 20 MG PO CPDR
20.0000 mg | DELAYED_RELEASE_CAPSULE | Freq: Every day | ORAL | 3 refills | Status: AC
Start: 2023-01-24 — End: ?

## 2023-01-24 MED ORDER — VENTOLIN HFA 108 (90 BASE) MCG/ACT IN AERS
1.0000 | INHALATION_SPRAY | Freq: Four times a day (QID) | RESPIRATORY_TRACT | 3 refills | Status: AC | PRN
Start: 2023-01-24 — End: ?

## 2023-01-24 NOTE — Progress Notes (Signed)
Assessment & Plan:  Brian [name] was seen today for hypertension.  Diagnoses and all orders for this visit:  Primary hypertension -     amLODipine (NORVASC) 10 MG tablet; Take 1 tablet (10 mg total) by mouth daily. -     CMP14+EGFR Continue all antihypertensives as prescribed.  Reminded to bring in blood pressure log for follow  up appointment.  RECOMMENDATIONS: DASH/Mediterranean Diets are healthier choices for HTN.    Mixed hyperglyceridemia -     atorvastatin (LIPITOR) 20 MG tablet; Take 1 tablet (20 mg total) by mouth daily.  Tobacco dependence -     VENTOLIN HFA 108 (90 Base) MCG/ACT inhaler; Inhale 1-2 puffs into the lungs every 6 (six) hours as needed for wheezing or shortness of breath.  Abnormal CBC -     CBC with Differential  Hypercholesterolemia -     Lipid panel  Prediabetes -     Hemoglobin A1c  Therapeutic drug monitoring -     Valproic acid level  Gastroesophageal reflux disease, unspecified whether esophagitis present -     omeprazole (PRILOSEC) 20 MG capsule; Take 1 capsule (20 mg total) by mouth daily. INSTRUCTIONS: Avoid GERD Triggers: acidic, spicy or fried foods, caffeine, coffee, sodas,  alcohol and chocolate.      Patient has been counseled on age-appropriate routine health concerns for screening and prevention. These are reviewed and up-to-date. Referrals have been placed accordingly. Immunizations are up-to-date or declined.    Subjective:   Chief Complaint  Patient presents with   Hypertension   HPI Brian [name] 37 y.o. male presents to office today for follow-up to hypertension and prediabetes.  Weight is up significantly (26 pounds since February).  He is not exercising and usually sleeps a lot during the day and eats often throughout the day.  He endorses dietary nonadherence and has been eating a lot of Ramen noodles which I have instructed him today to avoid due to excessive sodium intake.  He is scheduled for hernia repair surgery next  month.   Hypertension Blood pressure is well-controlled with amlodipine 10 mg daily. BP Readings from Last 3 Encounters:  01/24/23 129/87  10/15/22 (!) 136/95  08/06/22 134/86      Review of Systems  Constitutional:  Negative for fever, malaise/fatigue and weight loss.  HENT: Negative.  Negative for nosebleeds.   Eyes: Negative.  Negative for blurred vision, double vision and photophobia.  Respiratory: Negative.  Negative for cough and shortness of breath.   Cardiovascular: Negative.  Negative for chest pain, palpitations and leg swelling.  Gastrointestinal: Negative.  Negative for heartburn, nausea and vomiting.  Musculoskeletal: Negative.  Negative for myalgias.  Neurological: Negative.  Negative for dizziness, focal weakness, seizures and headaches.  Psychiatric/Behavioral: Negative.  Negative for suicidal ideas.     Past Medical History:  Diagnosis Date   Anxiety    Asthma    Bipolar 1 disorder (HCC)    Depression    Hepatitis C    Hypertension    PTSD (post-traumatic stress disorder)    Substance abuse (HCC)    in remission   Umbilical hernia     Past Surgical History:  Procedure Laterality Date   HIP SURGERY     MOUTH SURGERY      Family History  Problem Relation Age of Onset   Stroke Mother    Hypertension Father    Diabetes Father    Stroke Maternal Grandmother    Diabetes Paternal Grandmother     Social History Reviewed  with no changes to be made today.   Outpatient Medications Prior to Visit  Medication Sig Dispense Refill   benztropine (COGENTIN) 0.5 MG tablet Take 1 Tablet By mouth 2 times per day 60 tablet 3   cetirizine (ZYRTEC) 10 MG tablet Take 10 mg by mouth daily.     FLUoxetine (PROZAC) 20 MG capsule Take 1 Capsule By Oral Route 1 time per day 30 capsule 2   haloperidol (HALDOL) 10 MG tablet Take 1 Tablet By Oral Route 3 times per day 90 tablet 2   hydrOXYzine (ATARAX) 25 MG tablet Take 25 mg by mouth 3 (three) times daily as needed.      OVER THE COUNTER MEDICATION Take 1 Dose by mouth as needed (anxiety).     amLODipine (NORVASC) 10 MG tablet Take 1 tablet by mouth once daily 30 tablet 0   atorvastatin (LIPITOR) 20 MG tablet Take 1 tablet by mouth once daily 90 tablet 1   omeprazole (PRILOSEC) 20 MG capsule Take 20 mg by mouth daily.     VENTOLIN HFA 108 (90 Base) MCG/ACT inhaler INHALE 2 PUFFS BY MOUTH EVERY 6 HOURS AS NEEDED FOR WHEEZING OR SHORTNESS OF BREATH 18 g 0   divalproex (DEPAKOTE ER) 500 MG 24 hr tablet TAKE 2 TABLET BY ORAL ROUTE PER AT BEDTIME 60 tablet 2   prazosin (MINIPRESS) 1 MG capsule TAKE 1 CAPSULE BY ORAL ROUTE PER AT BEDTIME 30 capsule 2   QUEtiapine (SEROQUEL) 25 MG tablet TAKE 1 TABLET BY MOUTH AT BEDTIME AS NEEDED FOR SLEEP. 30 tablet 2   ramelteon (ROZEREM) 8 MG tablet TAKE 1 TABLET BY ORAL ROUTE 1 TIME PER DAY AT BEDTIME 30 tablet 2   No facility-administered medications prior to visit.    Allergies  Allergen Reactions   Phenytoin Sodium Extended Other (See Comments)    Gets sick to his stomach, and closes throat up   Pineapple Anaphylaxis   Risperdal [Risperidone] Shortness Of Breath    'throat closes up'       Objective:    BP 129/87 (BP Location: Left Arm, Patient Position: Sitting, Cuff Size: Large)   Pulse 95   Ht 6' (1.829 m)   Wt (!) 324 lb 9.6 oz (147.2 kg)   SpO2 94%   BMI 44.02 kg/m  Wt Readings from Last 3 Encounters:  01/24/23 (!) 324 lb 9.6 oz (147.2 kg)  10/15/22 298 lb (135.2 kg)  08/06/22 292 lb 3.2 oz (132.5 kg)    Physical Exam Vitals and nursing note reviewed.  Constitutional:      Appearance: He is well-developed. He is obese.  HENT:     Head: Normocephalic and atraumatic.  Cardiovascular:     Rate and Rhythm: Normal rate and regular rhythm.     Heart sounds: Normal heart sounds. No murmur heard.    No friction rub. No gallop.  Pulmonary:     Effort: Pulmonary effort is normal. No tachypnea or respiratory distress.     Breath sounds: Normal breath  sounds. No decreased breath sounds, wheezing, rhonchi or rales.  Chest:     Chest wall: No tenderness.  Abdominal:     General: Bowel sounds are normal.     Palpations: Abdomen is soft.  Musculoskeletal:        General: Normal range of motion.     Cervical back: Normal range of motion.  Skin:    General: Skin is warm and dry.  Neurological:     Mental Status: He is  alert and oriented to person, place, and time.     Coordination: Coordination normal.  Psychiatric:        Behavior: Behavior normal. Behavior is cooperative.        Thought Content: Thought content normal.        Judgment: Judgment normal.          Patient has been counseled extensively about nutrition and exercise as well as the importance of adherence with medications and regular follow-up. The patient was given clear instructions to go to ER or return to medical center if symptoms don't improve, worsen or new problems develop. The patient verbalized understanding.   Follow-up: Return in about 6 months (around 07/26/2023) for physical.   Claiborne Rigg, FNP-BC [facility] [place], [place] [phone]   01/24/2023, 12:22 PM

## 2023-01-25 ENCOUNTER — Encounter: Payer: Self-pay | Admitting: Nurse Practitioner

## 2023-01-25 ENCOUNTER — Other Ambulatory Visit: Payer: Self-pay | Admitting: Nurse Practitioner

## 2023-01-25 LAB — CMP14+EGFR
ALT: 15 IU/L (ref 0–44)
AST: 14 IU/L (ref 0–40)
Albumin/Globulin Ratio: 2 (ref 1.2–2.2)
Albumin: 4.6 g/dL (ref 4.1–5.1)
Alkaline Phosphatase: 111 IU/L (ref 44–121)
BUN/Creatinine Ratio: 12 (ref 9–20)
BUN: 9 mg/dL (ref 6–20)
Bilirubin Total: 0.4 mg/dL (ref 0.0–1.2)
CO2: 22 mmol/L (ref 20–29)
Calcium: 9.5 mg/dL (ref 8.7–10.2)
Chloride: 95 mmol/L — ABNORMAL LOW (ref 96–106)
Creatinine, Ser: 0.78 mg/dL (ref 0.76–1.27)
Globulin, Total: 2.3 g/dL (ref 1.5–4.5)
Glucose: 185 mg/dL — ABNORMAL HIGH (ref 70–99)
Potassium: 4.4 mmol/L (ref 3.5–5.2)
Sodium: 135 mmol/L (ref 134–144)
Total Protein: 6.9 g/dL (ref 6.0–8.5)
eGFR: 117 mL/min/{1.73_m2} (ref >59)

## 2023-01-25 LAB — LIPID PANEL
Chol/HDL Ratio: 4.7 ratio (ref 0.0–5.0)
Cholesterol, Total: 163 mg/dL (ref 100–199)
HDL: 35 mg/dL — ABNORMAL LOW (ref >39)
LDL Chol Calc (NIH): 95 mg/dL (ref 0–99)
Triglycerides: 191 mg/dL — ABNORMAL HIGH (ref 0–149)
VLDL Cholesterol Cal: 33 mg/dL (ref 5–40)

## 2023-01-25 LAB — CBC WITH DIFFERENTIAL/PLATELET
Basophils Absolute: 0.1 10*3/uL (ref 0.0–0.2)
Basos: 1 %
EOS (ABSOLUTE): 0.2 10*3/uL (ref 0.0–0.4)
Eos: 4 %
Hematocrit: 44 % (ref 37.5–51.0)
Hemoglobin: 14.7 g/dL (ref 13.0–17.7)
Immature Grans (Abs): 0 10*3/uL (ref 0.0–0.1)
Immature Granulocytes: 1 %
Lymphocytes Absolute: 1.9 10*3/uL (ref 0.7–3.1)
Lymphs: 29 %
MCH: 29.2 pg (ref 26.6–33.0)
MCHC: 33.4 g/dL (ref 31.5–35.7)
MCV: 87 fL (ref 79–97)
Monocytes Absolute: 0.6 10*3/uL (ref 0.1–0.9)
Monocytes: 10 %
Neutrophils Absolute: 3.7 10*3/uL (ref 1.4–7.0)
Neutrophils: 55 %
Platelets: 328 10*3/uL (ref 150–450)
RBC: 5.04 x10E6/uL (ref 4.14–5.80)
RDW: 11.9 % (ref 11.6–15.4)
WBC: 6.6 10*3/uL (ref 3.4–10.8)

## 2023-01-25 LAB — VALPROIC ACID LEVEL: Valproic Acid Lvl: 48 ug/mL — ABNORMAL LOW (ref 50–100)

## 2023-01-25 LAB — HEMOGLOBIN A1C
Est. average glucose Bld gHb Est-mCnc: 137 mg/dL
Hgb A1c MFr Bld: 6.4 % — ABNORMAL HIGH (ref 4.8–5.6)

## 2023-02-01 DIAGNOSIS — F411 Generalized anxiety disorder: Secondary | ICD-10-CM | POA: Diagnosis not present

## 2023-02-01 DIAGNOSIS — F250 Schizoaffective disorder, bipolar type: Secondary | ICD-10-CM | POA: Diagnosis not present

## 2023-02-09 DIAGNOSIS — R7303 Prediabetes: Secondary | ICD-10-CM | POA: Diagnosis not present

## 2023-02-09 DIAGNOSIS — E7800 Pure hypercholesterolemia, unspecified: Secondary | ICD-10-CM | POA: Diagnosis not present

## 2023-02-09 DIAGNOSIS — R635 Abnormal weight gain: Secondary | ICD-10-CM | POA: Diagnosis not present

## 2023-02-09 DIAGNOSIS — J4520 Mild intermittent asthma, uncomplicated: Secondary | ICD-10-CM | POA: Diagnosis not present

## 2023-02-09 DIAGNOSIS — I10 Essential (primary) hypertension: Secondary | ICD-10-CM | POA: Diagnosis not present

## 2023-02-09 DIAGNOSIS — K219 Gastro-esophageal reflux disease without esophagitis: Secondary | ICD-10-CM | POA: Diagnosis not present

## 2023-02-09 DIAGNOSIS — F200 Paranoid schizophrenia: Secondary | ICD-10-CM | POA: Diagnosis not present

## 2023-02-18 ENCOUNTER — Encounter (HOSPITAL_COMMUNITY): Payer: Self-pay | Admitting: Surgery

## 2023-02-18 ENCOUNTER — Other Ambulatory Visit: Payer: Self-pay

## 2023-02-18 NOTE — Anesthesia Preprocedure Evaluation (Signed)
Anesthesia Evaluation  Patient identified by MRN, date of birth, ID band Patient awake    Reviewed: Allergy & Precautions, NPO status , Patient's Chart, lab work & pertinent test results  Airway Mallampati: II       Dental  (+) Upper Dentures, Lower Dentures   Pulmonary asthma , Current SmokerPatient did not abstain from smoking.   Pulmonary exam normal breath sounds clear to auscultation       Cardiovascular hypertension, Pt. on medications Normal cardiovascular exam Rhythm:Regular Rate:Normal     Neuro/Psych  PSYCHIATRIC DISORDERS Anxiety Depression Bipolar Disorder Schizophrenia  negative neurological ROS     GI/Hepatic ,GERD  ,,(+)     substance abuse  , Hepatitis -, CHx/o Hep C - treated   Endo/Other  diabetes, Well Controlled, Type 2, Oral Hypoglycemic Agents  Morbid obesity  Renal/GU negative Renal ROS  negative genitourinary   Musculoskeletal Umbilical hernia    Abdominal  (+) + obese  Peds  Hematology negative hematology ROS (+)   Anesthesia Other Findings   Reproductive/Obstetrics                             Anesthesia Physical Anesthesia Plan  ASA: 3  Anesthesia Plan: General   Post-op Pain Management:    Induction: Intravenous  PONV Risk Score and Plan: 3  Airway Management Planned: Oral ETT and LMA  Additional Equipment: None  Intra-op Plan:   Post-operative Plan: Extubation in OR  Informed Consent: I have reviewed the patients History and Physical, chart, labs and discussed the procedure including the risks, benefits and alternatives for the proposed anesthesia with the patient or authorized representative who has indicated his/her understanding and acceptance.     Dental advisory given  Plan Discussed with: Anesthesiologist and CRNA  Anesthesia Plan Comments: (PAT note written 02/18/2023 by Shonna Chock, PA-C.  )       Anesthesia Quick  Evaluation

## 2023-02-18 NOTE — Progress Notes (Signed)
Spoke with pt's mother, Brian [name] for pre-op call. Pt is mentally challenged and he is with her 24/7. He has hx of bipolar, schizophrenia, anxiety and substance abuse.She states pt is pre-diabetic and takes Metformin daily. Instructed her to hold Metformin the day of surgery. His last A1C 6.4 on 01/24/23.  Brian [name] states she was given the Pre-Surgery Ensure when pt's surgery was scheduled earlier this year. Instructed her to have pt drink clear liquids until 9:30 AM on Tuesday and have pt drink the as the last liquids that morning. She voiced understanding.   Shower instructions given to [name].

## 2023-02-18 NOTE — Progress Notes (Addendum)
Anesthesia Chart Review: SAME DAY WORK-UP  Case: [id number] Date/Time: 02/22/23 1215   Procedure: HERNIA REPAIR UMBILICAL POSSIBLE MESH   Anesthesia type: General   Pre-op diagnosis: UMBILICAL HERNIA   [facility] ROOM [facility] / [facility]   Surgeons: Harriette Bouillon, MD       DISCUSSION: Patient is a 37 year old male scheduled for the above procedure.  History includes smoking, HTN, pre-diabetes, GERD, PTSD, Bipolar 1 disorder, anxiety, substance abuse ("in remission" since 2014), hepatitis C (s/p treatment), obesity.   He had PCP follow-up with labs on 01/24/23 showing A1c 6.4%, glucose 185, Cr 0.78, AST 14, ALT 15, and normal CBC. Valproic acid was low at 48, so Claiborne Rigg, NP encouraged compliance and communication with prescriber. BP 129/87. No medication changes made. She noted plans for surgery.   By notes, he lives with mother due to mental challenges, and she has difficulty getting him to get up early in the morning, so case scheduled for the afternoon. Mom also reported that he is extremely addicted to cigarettes. He tries to limit him to one an hour during the day. He also has access to 6 cigarettes during the night. She will attempt to restrict his smoking for 24 hours prior to surgery, but she is not optimistic that he will comply.   Anesthesia team to evaluate on the day of surgery.   VS: Ht 6' (1.829 m)   Wt (!) 147.2 kg   BMI 44.01 kg/m  BP Readings from Last 3 Encounters:  01/24/23 129/87  10/15/22 (!) 136/95  08/06/22 134/86   Pulse Readings from Last 3 Encounters:  01/24/23 95  10/15/22 93  08/06/22 94     PROVIDERS: Claiborne Rigg, NP is PCP [facility])   LABS: Most recent lab results in [facility] include: Lab Results  Component Value Date   WBC 6.6 01/24/2023   HGB 14.7 01/24/2023   HCT 44.0 01/24/2023   PLT 328 01/24/2023   GLUCOSE 185 (H) 01/24/2023   CHOL 163 01/24/2023   TRIG 191 (H) 01/24/2023   HDL 35 (L)  01/24/2023   LDLCALC 95 01/24/2023   ALT 15 01/24/2023   AST 14 01/24/2023   NA 135 01/24/2023   K 4.4 01/24/2023   CL 95 (L) 01/24/2023   CREATININE 0.78 01/24/2023   BUN 9 01/24/2023   CO2 22 01/24/2023   TSH 1.470 08/06/2022   HGBA1C 6.4 (H) 01/24/2023    IMAGES: [place] Abd 12/11/20: IMPRESSION: Probable umbilical hernia containing. This does not have typical ultrasound features of an abscess or neoplasm. If further evaluation is clinically indicated, this could be accomplished with an abdomen and pelvis CT with contrast.    EKG: 12/01/22: Normal sinus rhythm RSR' or QR pattern in V1 suggests right ventricular conduction delay Normal ECG When compared with ECG of 29-Apr-2018 16:51, Rate slower Confirmed by Bryan Lemma ([id number]) on 12/01/2022 9:59:36 PM   CV: N/A  Past Medical History:  Diagnosis Date   Anxiety    Asthma    Bipolar 1 disorder (HCC)    Depression    GERD (gastroesophageal reflux disease)    Hepatitis C    treated and no longer has it   Hypertension    Pre-diabetes    Psychosis (HCC)    schizophrenia   PTSD (post-traumatic stress disorder)    Substance abuse (HCC)    in remission   Umbilical hernia     Past Surgical History:  Procedure Laterality  Date   HIP SURGERY     MOUTH SURGERY      MEDICATIONS: No current facility-administered medications for this encounter.    amLODipine (NORVASC) 10 MG tablet   atorvastatin (LIPITOR) 20 MG tablet   benztropine (COGENTIN) 0.5 MG tablet   cetirizine (ZYRTEC) 10 MG tablet   divalproex (DEPAKOTE ER) 500 MG 24 hr tablet   famotidine (PEPCID) 20 MG tablet   FLUoxetine (PROZAC) 20 MG capsule   haloperidol (HALDOL) 10 MG tablet   hydrOXYzine (VISTARIL) 25 MG capsule   metFORMIN (GLUCOPHAGE-XR) 500 MG 24 hr tablet   omeprazole (PRILOSEC) 20 MG capsule   prazosin (MINIPRESS) 1 MG capsule   QUEtiapine (SEROQUEL) 25 MG tablet   ramelteon (ROZEREM) 8 MG tablet   VENTOLIN HFA 108 (90 Base) MCG/ACT  inhaler    Shonna Chock, PA-C Surgical Short Stay/Anesthesiology [facility] Phone [phone] [facility] Phone [phone] 02/18/2023 11:52 AM

## 2023-02-21 ENCOUNTER — Ambulatory Visit: Payer: Self-pay | Admitting: Surgery

## 2023-02-22 ENCOUNTER — Ambulatory Visit (HOSPITAL_BASED_OUTPATIENT_CLINIC_OR_DEPARTMENT_OTHER): Payer: 59 | Admitting: Vascular Surgery

## 2023-02-22 ENCOUNTER — Ambulatory Visit (HOSPITAL_BASED_OUTPATIENT_CLINIC_OR_DEPARTMENT_OTHER)
Admission: RE | Admit: 2023-02-22 | Discharge: 2023-02-22 | Disposition: A | Discharge status: Home / Self Care | Payer: 59 | Attending: Surgery | Admitting: Surgery

## 2023-02-22 ENCOUNTER — Other Ambulatory Visit: Payer: Self-pay

## 2023-02-22 ENCOUNTER — Encounter (HOSPITAL_COMMUNITY): Payer: Self-pay | Admitting: Surgery

## 2023-02-22 ENCOUNTER — Encounter (HOSPITAL_BASED_OUTPATIENT_CLINIC_OR_DEPARTMENT_OTHER)
Admission: RE | Disposition: A | Discharge status: Home / Self Care | Payer: Self-pay | Source: Home / Self Care | Attending: Surgery

## 2023-02-22 DIAGNOSIS — F319 Bipolar disorder, unspecified: Secondary | ICD-10-CM | POA: Diagnosis not present

## 2023-02-22 DIAGNOSIS — R7303 Prediabetes: Secondary | ICD-10-CM | POA: Insufficient documentation

## 2023-02-22 DIAGNOSIS — Z6841 Body Mass Index (BMI) 40.0 and over, adult: Secondary | ICD-10-CM | POA: Diagnosis not present

## 2023-02-22 DIAGNOSIS — K429 Umbilical hernia without obstruction or gangrene: Secondary | ICD-10-CM

## 2023-02-22 DIAGNOSIS — I10 Essential (primary) hypertension: Secondary | ICD-10-CM | POA: Insufficient documentation

## 2023-02-22 DIAGNOSIS — Z7984 Long term (current) use of oral hypoglycemic drugs: Secondary | ICD-10-CM | POA: Insufficient documentation

## 2023-02-22 DIAGNOSIS — K219 Gastro-esophageal reflux disease without esophagitis: Secondary | ICD-10-CM | POA: Insufficient documentation

## 2023-02-22 DIAGNOSIS — F419 Anxiety disorder, unspecified: Secondary | ICD-10-CM | POA: Insufficient documentation

## 2023-02-22 DIAGNOSIS — F209 Schizophrenia, unspecified: Secondary | ICD-10-CM | POA: Insufficient documentation

## 2023-02-22 DIAGNOSIS — Z79899 Other long term (current) drug therapy: Secondary | ICD-10-CM | POA: Diagnosis not present

## 2023-02-22 DIAGNOSIS — F4310 Post-traumatic stress disorder, unspecified: Secondary | ICD-10-CM | POA: Insufficient documentation

## 2023-02-22 DIAGNOSIS — E119 Type 2 diabetes mellitus without complications: Secondary | ICD-10-CM | POA: Diagnosis not present

## 2023-02-22 DIAGNOSIS — B1920 Unspecified viral hepatitis C without hepatic coma: Secondary | ICD-10-CM | POA: Diagnosis not present

## 2023-02-22 DIAGNOSIS — J45909 Unspecified asthma, uncomplicated: Secondary | ICD-10-CM | POA: Insufficient documentation

## 2023-02-22 DIAGNOSIS — F17210 Nicotine dependence, cigarettes, uncomplicated: Secondary | ICD-10-CM | POA: Diagnosis not present

## 2023-02-22 HISTORY — DX: Gastro-esophageal reflux disease without esophagitis: K21.9

## 2023-02-22 HISTORY — DX: Unspecified psychosis not due to a substance or known physiological condition: F29

## 2023-02-22 HISTORY — PX: UMBILICAL HERNIA REPAIR: SHX196

## 2023-02-22 HISTORY — DX: Prediabetes: R73.03

## 2023-02-22 LAB — GLUCOSE, CAPILLARY
Glucose-Capillary: 143 mg/dL — ABNORMAL HIGH (ref 70–99)
Glucose-Capillary: 111 mg/dL — ABNORMAL HIGH (ref 70–99)

## 2023-02-22 SURGERY — REPAIR, HERNIA, UMBILICAL, ADULT
Anesthesia: General | Site: Abdomen

## 2023-02-22 MED ORDER — FENTANYL CITRATE (PF) 100 MCG/2ML IJ SOLN
INTRAMUSCULAR | Status: DC | PRN
  Administered 2023-02-22: 100 ug via INTRAVENOUS

## 2023-02-22 MED ORDER — LIDOCAINE 2% (20 MG/ML) 5 ML SYRINGE
INTRAMUSCULAR | Status: AC
  Filled 2023-02-22: qty 5

## 2023-02-22 MED ORDER — MIDAZOLAM HCL 2 MG/2ML IJ SOLN
INTRAMUSCULAR | Status: AC
  Filled 2023-02-22: qty 2

## 2023-02-22 MED ORDER — CHLORHEXIDINE GLUCONATE CLOTH 2 % EX PADS: 6.0000 | MEDICATED_PAD | Freq: Once | CUTANEOUS | Status: DC

## 2023-02-22 MED ORDER — OXYCODONE HCL 5 MG PO TABS: 5.0000 mg | ORAL_TABLET | Freq: Once | ORAL | Status: DC | PRN

## 2023-02-22 MED ORDER — PROPOFOL 10 MG/ML IV BOLUS
INTRAVENOUS | Status: AC
  Filled 2023-02-22: qty 20

## 2023-02-22 MED ORDER — ROCURONIUM BROMIDE 100 MG/10ML IV SOLN
INTRAVENOUS | Status: DC | PRN
  Administered 2023-02-22: 70 mg via INTRAVENOUS

## 2023-02-22 MED ORDER — DEXAMETHASONE SODIUM PHOSPHATE 10 MG/ML IJ SOLN
INTRAMUSCULAR | Status: AC
  Filled 2023-02-22: qty 1

## 2023-02-22 MED ORDER — DEXAMETHASONE SODIUM PHOSPHATE 10 MG/ML IJ SOLN
INTRAMUSCULAR | Status: DC | PRN
  Administered 2023-02-22: 4 mg via INTRAVENOUS

## 2023-02-22 MED ORDER — ONDANSETRON HCL 4 MG/2ML IJ SOLN
INTRAMUSCULAR | Status: DC | PRN
  Administered 2023-02-22: 4 mg via INTRAVENOUS

## 2023-02-22 MED ORDER — PROPOFOL 10 MG/ML IV BOLUS
INTRAVENOUS | Status: DC | PRN
  Administered 2023-02-22: 30 mg via INTRAVENOUS
  Administered 2023-02-22: 150 mg via INTRAVENOUS

## 2023-02-22 MED ORDER — ROCURONIUM BROMIDE 10 MG/ML (PF) SYRINGE
PREFILLED_SYRINGE | INTRAVENOUS | Status: AC
  Filled 2023-02-22: qty 10

## 2023-02-22 MED ORDER — OXYCODONE HCL 5 MG PO TABS
5.0000 mg | ORAL_TABLET | Freq: Four times a day (QID) | ORAL | 0 refills | Status: DC | PRN
Start: 2023-02-22 — End: 2023-09-19

## 2023-02-22 MED ORDER — OXYCODONE HCL 5 MG/5ML PO SOLN: 5.0000 mg | Freq: Once | ORAL | Status: DC | PRN

## 2023-02-22 MED ORDER — LIDOCAINE 2% (20 MG/ML) 5 ML SYRINGE
INTRAMUSCULAR | Status: DC | PRN
  Administered 2023-02-22: 100 mg via INTRAVENOUS

## 2023-02-22 MED ORDER — SUGAMMADEX SODIUM 200 MG/2ML IV SOLN
INTRAVENOUS | Status: DC | PRN
  Administered 2023-02-22: 300 mg via INTRAVENOUS

## 2023-02-22 MED ORDER — CEFAZOLIN IN SODIUM CHLORIDE 3-0.9 GM/100ML-% IV SOLN
INTRAVENOUS | Status: AC
  Filled 2023-02-22: qty 100

## 2023-02-22 MED ORDER — MIDAZOLAM HCL 5 MG/5ML IJ SOLN
INTRAMUSCULAR | Status: DC | PRN
  Administered 2023-02-22: 2 mg via INTRAVENOUS

## 2023-02-22 MED ORDER — FENTANYL CITRATE (PF) 100 MCG/2ML IJ SOLN
INTRAMUSCULAR | Status: AC
  Filled 2023-02-22: qty 2

## 2023-02-22 MED ORDER — DEXMEDETOMIDINE HCL IN NACL 80 MCG/20ML IV SOLN
INTRAVENOUS | Status: DC | PRN
  Administered 2023-02-22 (×3): 4 ug via INTRAVENOUS
  Administered 2023-02-22: 8 ug via INTRAVENOUS

## 2023-02-22 MED ORDER — CEFAZOLIN IN SODIUM CHLORIDE 3-0.9 GM/100ML-% IV SOLN
3.0000 g | INTRAVENOUS | Status: AC
  Administered 2023-02-22: 3 g via INTRAVENOUS

## 2023-02-22 MED ORDER — BUPIVACAINE-EPINEPHRINE 0.25% -1:200000 IJ SOLN
INTRAMUSCULAR | Status: DC | PRN
  Administered 2023-02-22: 19 mL

## 2023-02-22 MED ORDER — ONDANSETRON HCL 4 MG/2ML IJ SOLN
INTRAMUSCULAR | Status: AC
  Filled 2023-02-22: qty 2

## 2023-02-22 MED ORDER — ONDANSETRON HCL 4 MG/2ML IJ SOLN: 4.0000 mg | Freq: Once | INTRAMUSCULAR | Status: DC | PRN

## 2023-02-22 MED ORDER — FENTANYL CITRATE (PF) 100 MCG/2ML IJ SOLN
25.0000 ug | INTRAMUSCULAR | Status: DC | PRN
  Administered 2023-02-22: 50 ug via INTRAVENOUS

## 2023-02-22 MED ORDER — LACTATED RINGERS IV SOLN: INTRAVENOUS | Status: DC

## 2023-02-22 SURGICAL SUPPLY — 38 items
ADH SKN CLS APL DERMABOND .7 (GAUZE/BANDAGES/DRESSINGS) ×1
APL PRP STRL LF DISP 70% ISPRP (MISCELLANEOUS) ×1
BLADE CLIPPER SURG (BLADE) IMPLANT
BLADE SURG 15 STRL LF DISP TIS (BLADE) ×1 IMPLANT
BLADE SURG 15 STRL SS (BLADE) ×1
CANISTER SUCT 1200ML W/VALVE (MISCELLANEOUS) IMPLANT
CHLORAPREP W/TINT 26 (MISCELLANEOUS) ×1 IMPLANT
COVER BACK TABLE 60X90IN (DRAPES) ×1 IMPLANT
COVER MAYO STAND STRL (DRAPES) ×1 IMPLANT
DERMABOND ADVANCED .7 DNX12 (GAUZE/BANDAGES/DRESSINGS) ×1 IMPLANT
DRAPE LAPAROTOMY TRNSV 102X78 (DRAPES) ×1 IMPLANT
DRAPE UTILITY XL STRL (DRAPES) ×1 IMPLANT
ELECT COATED BLADE 2.86 ST (ELECTRODE) ×1 IMPLANT
ELECT REM PT RETURN 9FT ADLT (ELECTROSURGICAL) ×1
ELECTRODE REM PT RTRN 9FT ADLT (ELECTROSURGICAL) ×1 IMPLANT
GLOVE BIOGEL PI IND STRL 8 (GLOVE) ×1 IMPLANT
GLOVE ECLIPSE 8.0 STRL XLNG CF (GLOVE) ×1 IMPLANT
GOWN STRL REUS W/ TWL LRG LVL3 (GOWN DISPOSABLE) ×1 IMPLANT
GOWN STRL REUS W/ TWL XL LVL3 (GOWN DISPOSABLE) ×1 IMPLANT
GOWN STRL REUS W/TWL LRG LVL3 (GOWN DISPOSABLE) ×1
GOWN STRL REUS W/TWL XL LVL3 (GOWN DISPOSABLE) ×1
NDL HYPO 25X1 1.5 SAFETY (NEEDLE) ×1 IMPLANT
NEEDLE HYPO 25X1 1.5 SAFETY (NEEDLE) ×1 IMPLANT
NS IRRIG 1000ML POUR BTL (IV SOLUTION) IMPLANT
PACK BASIN DAY SURGERY FS (CUSTOM PROCEDURE TRAY) ×1 IMPLANT
PENCIL SMOKE EVACUATOR (MISCELLANEOUS) ×1 IMPLANT
SLEEVE SCD COMPRESS KNEE MED (STOCKING) ×1 IMPLANT
SPIKE FLUID TRANSFER (MISCELLANEOUS) IMPLANT
SUT MNCRL AB 4-0 PS2 18 (SUTURE) ×1 IMPLANT
SUT NOVA 0 T19/GS 22DT (SUTURE) ×1 IMPLANT
SUT SILK 3 0 SH 30 (SUTURE) IMPLANT
SUT VIC AB 2-0 SH 27 (SUTURE) ×1
SUT VIC AB 2-0 SH 27XBRD (SUTURE) ×1 IMPLANT
SUT VICRYL 3-0 CR8 SH (SUTURE) ×1 IMPLANT
SYR CONTROL 10ML LL (SYRINGE) ×1 IMPLANT
TOWEL GREEN STERILE FF (TOWEL DISPOSABLE) ×1 IMPLANT
TUBE CONNECTING 20X1/4 (TUBING) IMPLANT
YANKAUER SUCT BULB TIP NO VENT (SUCTIONS) IMPLANT

## 2023-02-22 NOTE — Transfer of Care (Signed)
Immediate Anesthesia Transfer of Care Note  Patient: Brian [name]  Procedure(s) Performed: HERNIA REPAIR UMBILICAL (Abdomen)  Patient Location: PACU  Anesthesia Type:General  Level of Consciousness: awake and alert   Airway & Oxygen Therapy: Patient Spontanous Breathing and Patient connected to face mask oxygen  Post-op Assessment: Report given to RN and Post -op Vital signs reviewed and stable  Post vital signs: Reviewed and stable  Last Vitals:  Vitals Value Taken Time  BP 152/99 02/22/23 1336  Temp    Pulse 101 02/22/23 1339  Resp 15 02/22/23 1339  SpO2 89 % 02/22/23 1339  Vitals shown include unvalidated device data.  Last Pain:  Vitals:   02/22/23 1028  TempSrc: Oral  PainSc: 0-No pain         Complications: No notable events documented.

## 2023-02-22 NOTE — Anesthesia Postprocedure Evaluation (Signed)
Anesthesia Post Note  Patient: Brian [name]  Procedure(s) Performed: HERNIA REPAIR UMBILICAL (Abdomen)     Patient location during evaluation: PACU Anesthesia Type: General Level of consciousness: awake and alert and oriented Pain management: pain level controlled Vital Signs Assessment: post-procedure vital signs reviewed and stable Respiratory status: spontaneous breathing, nonlabored ventilation and respiratory function stable Cardiovascular status: blood pressure returned to baseline and stable Postop Assessment: no apparent nausea or vomiting Anesthetic complications: no   No notable events documented.  Last Vitals:  Vitals:   02/22/23 1400 02/22/23 1415  BP: (!) 138/54 112/72  Pulse: 86 87  Resp: 14 16  Temp:    SpO2: 94% 92%    Last Pain:  Vitals:   02/22/23 1415  TempSrc:   PainSc: 2                  Brian [name] A.

## 2023-02-22 NOTE — Anesthesia Procedure Notes (Signed)
Procedure Name: Intubation Date/Time: 02/22/2023 12:51 PM  Performed by: Lauralyn Primes, CRNAPre-anesthesia Checklist: Patient identified, Emergency Drugs available, Suction available and Patient being monitored Patient Re-evaluated:Patient Re-evaluated prior to induction Oxygen Delivery Method: Circle system utilized Preoxygenation: Pre-oxygenation with 100% oxygen Induction Type: IV induction Ventilation: Mask ventilation without difficulty Laryngoscope Size: Mac and 4 Grade View: Grade I Tube type: Oral Tube size: 7.5 mm Number of attempts: 1 Airway Equipment and Method: Stylet, Oral airway and Bite block Placement Confirmation: ETT inserted through vocal cords under direct vision, positive ETCO2 and breath sounds checked- equal and bilateral Secured at: 22 cm Tube secured with: Tape Dental Injury: Teeth and Oropharynx as per pre-operative assessment

## 2023-02-22 NOTE — Op Note (Signed)
Brian [name] March 28, 1986 [id number] 02/22/2023  Preoperative diagnosis: Umbilical hernia reducible 1 cm    Postoperative diagnosis: Same    Procedure: Umbilical Hernia Repair without mesh  Surgeon: Harriette Bouillon , MD, FACS  Anesthesia: General and quarter percent Sensorcaine local    Clinical History and Indications: Patient presents for repair of symptomatic umbilical hernia.  He is evaluated this was located on the examination table like it.  Is been causing him pain.  Risks and benefits of hernia surgery reviewedThe risk of hernia repair include bleeding,  Infection,   Recurrence of the hernia,  Mesh use, chronic pain,  Organ injury,  Bowel injury,  Bladder injury,   nerve injury with numbness around the incision,  Death,  and worsening of preexisting  medical problems.  The alternatives to surgery have been discussed as well..  Long term expectations of both operative and non operative treatments have been discussed.   The patient agrees to proceed. .  Procedure: The patient was seen in the preoperative area and the plans for the procedure reviewed again. He  had no further questions. I marked the area of the umbilicus as the operative site. He wishes to prodeed.  The patient was taken to the operating room and after satisfactory general anesthesia had been obtained the area was clipped as needed, prepped and draped. The timeout was performed.  I used some 0.25% Marcaine local anesthesia to help with postoperative pain management. This was infiltrated around the umbilical area and additional infiltrated as I worked.  A curvilinear incision was made on the inferior aspect of the umbilicus. The umbilical skin was elevated off of the hernia sac. The hernia sac was dissected free of the subcutaneous tissues.  The sac was reduced back into the preperitoneal space.  Defect measured 10 mm.  The defect was closed primarily with #1 Novafil sutures in a figure-of-eight configuration.  This closed  the defect nicely without any significant undue tension.  The subcutaneous tissue space was irrigated with saline.  Once the repair was complete the incision was closed by using some 3-0 Vicryl subcutaneous and 4-0 Monocryl subcuticular sutures.  The patient tolerated the procedure well. There were no operative complications. There was minimal blood loss. All counts were correct. He was taken to the PACU in satisfactory condition.  Harriette Bouillon , MD, FACS 02/22/2023 1:34 PM

## 2023-02-22 NOTE — Interval H&P Note (Signed)
History and Physical Interval Note:  02/22/2023 12:17 PM  Brian [name]  has presented today for surgery, with the diagnosis of UMBILICAL HERNIA.  The various methods of treatment have been discussed with the patient and family. After consideration of risks, benefits and other options for treatment, the patient has consented to  Procedure(s): HERNIA REPAIR UMBILICAL POSSIBLE MESH (N/A) as a surgical intervention.  The patient's history has been reviewed, patient examined, no change in status, stable for surgery.  I have reviewed the patient's chart and labs.  Questions were answered to the patient's satisfaction.   The risk of hernia repair include bleeding,  Infection,   Recurrence of the hernia,  Mesh use, chronic pain,  Organ injury,  Bowel injury,  Bladder injury,   nerve injury with numbness around the incision,  Death,  and worsening of preexisting  medical problems.  The alternatives to surgery have been discussed as well..  Long term expectations of both operative and non operative treatments have been discussed.   The patient agrees to proceed.   Henrine Hayter A Mikeyla Music

## 2023-02-22 NOTE — H&P (Signed)
History of Present Illness: Brian [name] is a 37 y.o. male who is seen today as an office consultation for evaluation of Hernia  Patient presents for evaluation of umbilical hernia. He was seen in February emergency room due to drainage was found to have a small umbilical hernia as well as what appeared to be cellulitis at the umbilicus treated with antibiotics. That is all resolved. He has a longstanding history of pain at the umbilicus and a small hernia was noted on exam when he was seen in the emergency room at that time. No history of nausea, vomiting or change in bowel or bladder function.  Review of Systems: A complete review of systems was obtained from the patient. I have reviewed this information and discussed as appropriate with the patient. See HPI as well for other ROS.    Medical History: Past Medical History: Diagnosis Date Anxiety Arthritis Asthma, unspecified asthma severity, unspecified whether complicated, unspecified whether persistent Hypertension  There is no problem list on file for this patient.  History reviewed. No pertinent surgical history.  Allergies Allergen Reactions Phenytoin Sodium Extended Other (See Comments) Gets sick to his stomach, and closes throat up Pineapple Anaphylaxis Risperidone Shortness Of Breath 'throat closes up'  Current Outpatient Medications on File Prior to Visit Medication Sig Dispense Refill amLODIPine (NORVASC) 10 MG tablet Take 1 tablet by mouth once daily atorvastatin (LIPITOR) 20 MG tablet Take 1 tablet by mouth once daily benztropine (COGENTIN) 0.5 MG tablet Take 1 tablet by mouth 2 (two) times daily divalproex (DEPAKOTE ER) 500 MG ER tablet Take 1,000 mg by mouth at bedtime FLUoxetine (PROZAC) 20 MG capsule Take 1 Capsule By Oral Route 1 time per day haloperidoL (HALDOL) 10 MG tablet Take 1 Tablet By Oral Route 3 times per day omeprazole (PRILOSEC) 20 MG DR capsule Take 1 capsule by mouth once daily prazosin  (MINIPRESS) 1 MG capsule TAKE 1 CAPSULE BY MOUTH EVERY DAY AT BEDTIME QUEtiapine (SEROQUEL) 25 MG tablet Take 25 mg by mouth at bedtime as needed ramelteon (ROZEREM) 8 mg tablet Take 8 mg by mouth at bedtime VENTOLIN HFA 90 mcg/actuation inhaler Inhale 2 inhalations into the lungs every 6 (six) hours as needed  No current facility-administered medications on file prior to visit.  Family History Problem Relation Age of Onset Skin cancer Mother High blood pressure (Hypertension) Mother Hyperlipidemia (Elevated cholesterol) Mother High blood pressure (Hypertension) Father Diabetes Father   Social History  Tobacco Use Smoking Status Every Day Types: Cigarettes Smokeless Tobacco Never   Social History  Socioeconomic History Marital status: Single Tobacco Use Smoking status: Every Day Types: Cigarettes Smokeless tobacco: Never  Objective:  Vitals: 11/01/22 1103 BP: 130/80 Pulse: 101 Weight: (!) 139.3 kg (307 lb) Height: 185.4 cm (6\' 1" ) PainSc: 0-No pain  Body mass index is 40.5 kg/m.  Physical Exam HENT: Head: Normocephalic. Abdominal: Palpations: Abdomen is soft. Tenderness: There is no abdominal tenderness.  Comments: Small umbilical hernia 1 cm reducible Musculoskeletal: General: Normal range of motion. Skin: General: Skin is warm. Neurological: General: No focal deficit present. Mental Status: He is alert and oriented to person, place, and time. Psychiatric: Mood and Affect: Mood normal. Behavior: Behavior normal.    Assessment and Plan:  Diagnoses and all orders for this visit:  Umbilical hernia without obstruction and without gangrene  1 CM REDUCIBLE Discussed repair of umbilical hernia. Risks and benefits reviewed. This is quite small at the umbilicus. He also has had a history of discharge from the umbilicus which is probably  separate from the hernia itself. Risk of bleeding, infection, organ injury, recurrence, the need for treatment  standard procedures were discussed. Anesthesia risk and exacerbation of underlying medical problems reviewed as well. Expected postoperative course reviewed. Discussed potential use of mesh and complications of infection, erosion, and hernia recurrence.   Hayden Rasmussen, MD

## 2023-02-22 NOTE — Discharge Instructions (Addendum)
[facility] _______[facility]  UMBILICAL OR INGUINAL HERNIA REPAIR: POST OP INSTRUCTIONS  Always review your discharge instruction sheet given to you by the facility where your surgery was performed. IF YOU HAVE DISABILITY OR FAMILY LEAVE FORMS, YOU MUST BRING THEM TO THE OFFICE FOR PROCESSING.   DO NOT GIVE THEM TO YOUR DOCTOR.  1. A  prescription for pain medication may be given to you upon discharge.  Take your pain medication as prescribed, if needed.  If narcotic pain medicine is not needed, then you may take acetaminophen (Tylenol) or ibuprofen (Advil) as needed. 2. Take your usually prescribed medications unless otherwise directed. If you need a refill on your pain medication, please contact your pharmacy.  They will contact our office to request authorization. Prescriptions will not be filled after 5 pm or on week-ends. 3. You should follow a light diet the first 24 hours after arrival home, such as soup and crackers, etc.  Be sure to include lots of fluids daily.  Resume your normal diet the day after surgery. 4.Most patients will experience some swelling and bruising around the umbilicus or in the groin and scrotum.  Ice packs and reclining will help.  Swelling and bruising can take several days to resolve.  6. It is common to experience some constipation if taking pain medication after surgery.  Increasing fluid intake and taking a stool softener (such as Colace) will usually help or prevent this problem from occurring.  A mild laxative (Milk of Magnesia or Miralax) should be taken according to package directions if there are no bowel movements after 48 hours. 7. Unless discharge instructions indicate otherwise, you may remove your bandages 24-48 hours after surgery, and you may shower at that time.  You may have steri-strips (small skin tapes) in place directly over the incision.  These strips should be left on the skin for 7-10 days.  If your surgeon used skin glue on the  incision, you may shower in 24 hours.  The glue will flake off over the next 2-3 weeks.  Any sutures or staples will be removed at the office during your follow-up visit. 8. ACTIVITIES:  You may resume regular (light) daily activities beginning the next day--such as daily self-care, walking, climbing stairs--gradually increasing activities as tolerated.  You may have sexual intercourse when it is comfortable.  Refrain from any heavy lifting or straining until approved by your doctor.  a.You may drive when you are no longer taking prescription pain medication, you can comfortably wear a seatbelt, and you can safely maneuver your car and apply brakes. b.RETURN TO WORK:   _____________________________________________  9.You should see your doctor in the office for a follow-up appointment approximately 2-3 weeks after your surgery.  Make sure that you call for this appointment within a day or two after you arrive home to insure a convenient appointment time. 10.OTHER INSTRUCTIONS: _________________________    _____________________________________  WHEN TO CALL YOUR DOCTOR: Fever over 101.0 Inability to urinate Nausea and/or vomiting Extreme swelling or bruising Continued bleeding from incision. Increased pain, redness, or drainage from the incision  The clinic staff is available to answer your questions during regular business hours.  Please don't hesitate to call and ask to speak to one of the nurses for clinical concerns.  If you have a medical emergency, go to the nearest emergency room or call 911.  A surgeon from [facility] is always on call at the hospital   [address], [address],   [place], [place]  [address] ?  [address] [address], [place], [place]   [address] [phone] ? [phone] ? FAX [phone] Web site: [email or web address]  Post Anesthesia Home Care Instructions  Activity: Get plenty of rest for the remainder of the day. A responsible  individual must stay with you for 24 hours following the procedure.  For the next 24 hours, DO NOT: -Drive a car -Operate machinery -Drink alcoholic beverages -Take any medication unless instructed by your physician -Make any legal decisions or sign important papers.  Meals: Start with liquid foods such as gelatin or soup. Progress to regular foods as tolerated. Avoid greasy, spicy, heavy foods. If nausea and/or vomiting occur, drink only clear liquids until the nausea and/or vomiting subsides. Call your physician if vomiting continues.  Special Instructions/Symptoms: Your throat may feel dry or sore from the anesthesia or the breathing tube placed in your throat during surgery. If this causes discomfort, gargle with warm salt water. The discomfort should disappear within 24 hours.  If you had a scopolamine patch placed behind your ear for the management of post- operative nausea and/or vomiting:  1. The medication in the patch is effective for 72 hours, after which it should be removed.  Wrap patch in a tissue and discard in the trash. Wash hands thoroughly with soap and water. 2. You may remove the patch earlier than 72 hours if you experience unpleasant side effects which may include dry mouth, dizziness or visual disturbances. 3. Avoid touching the patch. Wash your hands with soap and water after contact with the patch.     

## 2023-02-23 ENCOUNTER — Encounter (HOSPITAL_BASED_OUTPATIENT_CLINIC_OR_DEPARTMENT_OTHER): Payer: Self-pay | Admitting: Surgery

## 2023-07-26 ENCOUNTER — Encounter: Payer: Self-pay | Admitting: Nurse Practitioner

## 2023-09-19 ENCOUNTER — Other Ambulatory Visit: Payer: Self-pay

## 2023-09-19 ENCOUNTER — Inpatient Hospital Stay (HOSPITAL_COMMUNITY)
Admission: AD | Admit: 2023-09-19 | Discharge: 2023-09-26 | DRG: 885 | Disposition: A | Discharge status: Home / Self Care | Payer: MEDICAID | Source: Intra-hospital | Attending: Psychiatry | Admitting: Psychiatry

## 2023-09-19 ENCOUNTER — Emergency Department (HOSPITAL_COMMUNITY): Payer: MEDICAID

## 2023-09-19 ENCOUNTER — Emergency Department (HOSPITAL_COMMUNITY)
Admission: EM | Admit: 2023-09-19 | Discharge: 2023-09-19 | Disposition: A | Discharge status: Other Acute Inpatient Hospital | Payer: MEDICAID

## 2023-09-19 ENCOUNTER — Encounter (HOSPITAL_COMMUNITY): Payer: Self-pay | Admitting: Nurse Practitioner

## 2023-09-19 ENCOUNTER — Encounter (HOSPITAL_COMMUNITY): Payer: Self-pay | Admitting: Emergency Medicine

## 2023-09-19 DIAGNOSIS — F250 Schizoaffective disorder, bipolar type: Secondary | ICD-10-CM | POA: Diagnosis present

## 2023-09-19 DIAGNOSIS — K219 Gastro-esophageal reflux disease without esophagitis: Secondary | ICD-10-CM | POA: Diagnosis present

## 2023-09-19 DIAGNOSIS — F329 Major depressive disorder, single episode, unspecified: Principal | ICD-10-CM | POA: Diagnosis present

## 2023-09-19 DIAGNOSIS — F259 Schizoaffective disorder, unspecified: Secondary | ICD-10-CM | POA: Diagnosis not present

## 2023-09-19 DIAGNOSIS — Z9151 Personal history of suicidal behavior: Secondary | ICD-10-CM | POA: Diagnosis not present

## 2023-09-19 DIAGNOSIS — R45850 Homicidal ideations: Secondary | ICD-10-CM | POA: Diagnosis present

## 2023-09-19 DIAGNOSIS — Z5941 Food insecurity: Secondary | ICD-10-CM

## 2023-09-19 DIAGNOSIS — Z91018 Allergy to other foods: Secondary | ICD-10-CM

## 2023-09-19 DIAGNOSIS — G4700 Insomnia, unspecified: Secondary | ICD-10-CM | POA: Diagnosis present

## 2023-09-19 DIAGNOSIS — R45851 Suicidal ideations: Secondary | ICD-10-CM | POA: Diagnosis present

## 2023-09-19 DIAGNOSIS — R7303 Prediabetes: Secondary | ICD-10-CM | POA: Diagnosis present

## 2023-09-19 DIAGNOSIS — F17220 Nicotine dependence, chewing tobacco, uncomplicated: Secondary | ICD-10-CM | POA: Diagnosis present

## 2023-09-19 DIAGNOSIS — Z7984 Long term (current) use of oral hypoglycemic drugs: Secondary | ICD-10-CM | POA: Insufficient documentation

## 2023-09-19 DIAGNOSIS — F411 Generalized anxiety disorder: Secondary | ICD-10-CM | POA: Diagnosis present

## 2023-09-19 DIAGNOSIS — Z818 Family history of other mental and behavioral disorders: Secondary | ICD-10-CM | POA: Diagnosis not present

## 2023-09-19 DIAGNOSIS — F17200 Nicotine dependence, unspecified, uncomplicated: Secondary | ICD-10-CM | POA: Insufficient documentation

## 2023-09-19 DIAGNOSIS — Z888 Allergy status to other drugs, medicaments and biological substances status: Secondary | ICD-10-CM | POA: Diagnosis not present

## 2023-09-19 DIAGNOSIS — F332 Major depressive disorder, recurrent severe without psychotic features: Secondary | ICD-10-CM | POA: Diagnosis present

## 2023-09-19 DIAGNOSIS — Z5948 Other specified lack of adequate food: Secondary | ICD-10-CM | POA: Diagnosis not present

## 2023-09-19 DIAGNOSIS — Z8619 Personal history of other infectious and parasitic diseases: Secondary | ICD-10-CM | POA: Diagnosis not present

## 2023-09-19 DIAGNOSIS — Z5982 Transportation insecurity: Secondary | ICD-10-CM

## 2023-09-19 DIAGNOSIS — J45909 Unspecified asthma, uncomplicated: Secondary | ICD-10-CM | POA: Diagnosis present

## 2023-09-19 DIAGNOSIS — E785 Hyperlipidemia, unspecified: Secondary | ICD-10-CM | POA: Diagnosis present

## 2023-09-19 DIAGNOSIS — F17210 Nicotine dependence, cigarettes, uncomplicated: Secondary | ICD-10-CM | POA: Diagnosis present

## 2023-09-19 DIAGNOSIS — Z79899 Other long term (current) drug therapy: Secondary | ICD-10-CM | POA: Diagnosis not present

## 2023-09-19 DIAGNOSIS — I10 Essential (primary) hypertension: Secondary | ICD-10-CM | POA: Diagnosis present

## 2023-09-19 DIAGNOSIS — Z8249 Family history of ischemic heart disease and other diseases of the circulatory system: Secondary | ICD-10-CM | POA: Diagnosis not present

## 2023-09-19 DIAGNOSIS — F251 Schizoaffective disorder, depressive type: Secondary | ICD-10-CM | POA: Diagnosis present

## 2023-09-19 DIAGNOSIS — Z833 Family history of diabetes mellitus: Secondary | ICD-10-CM

## 2023-09-19 DIAGNOSIS — F4310 Post-traumatic stress disorder, unspecified: Secondary | ICD-10-CM | POA: Diagnosis present

## 2023-09-19 HISTORY — DX: Unspecified convulsions: R56.9

## 2023-09-19 LAB — COMPREHENSIVE METABOLIC PANEL
ALT: 15 U/L (ref 0–44)
AST: 19 U/L (ref 15–41)
Albumin: 4 g/dL (ref 3.5–5.0)
Alkaline Phosphatase: 103 U/L (ref 38–126)
Anion gap: 11 (ref 5–15)
BUN: <5 mg/dL — ABNORMAL LOW (ref 6–20)
CO2: 24 mmol/L (ref 22–32)
Calcium: 9.4 mg/dL (ref 8.9–10.3)
Chloride: 96 mmol/L — ABNORMAL LOW (ref 98–111)
Creatinine, Ser: 0.9 mg/dL (ref 0.61–1.24)
GFR, Estimated: >60 mL/min (ref >60)
Glucose, Bld: 167 mg/dL — ABNORMAL HIGH (ref 70–99)
Potassium: 3.3 mmol/L — ABNORMAL LOW (ref 3.5–5.1)
Sodium: 131 mmol/L — ABNORMAL LOW (ref 135–145)
Total Bilirubin: <0.2 mg/dL (ref 0.0–1.2)
Total Protein: 7.2 g/dL (ref 6.5–8.1)

## 2023-09-19 LAB — RAPID URINE DRUG SCREEN, HOSP PERFORMED
Amphetamines: NOT DETECTED
Barbiturates: NOT DETECTED
Benzodiazepines: NOT DETECTED
Cocaine: NOT DETECTED
Opiates: NOT DETECTED
Tetrahydrocannabinol: NOT DETECTED

## 2023-09-19 LAB — CBC
HCT: 42.3 % (ref 39.0–52.0)
Hemoglobin: 14.6 g/dL (ref 13.0–17.0)
MCH: 28.8 pg (ref 26.0–34.0)
MCHC: 34.5 g/dL (ref 30.0–36.0)
MCV: 83.4 fL (ref 80.0–100.0)
Platelets: 363 10*3/uL (ref 150–400)
RBC: 5.07 MIL/uL (ref 4.22–5.81)
RDW: 12.2 % (ref 11.5–15.5)
WBC: 9.3 10*3/uL (ref 4.0–10.5)
nRBC: 0 % (ref 0.0–0.2)

## 2023-09-19 LAB — SALICYLATE LEVEL: Salicylate Lvl: <7 mg/dL — ABNORMAL LOW (ref 7.0–30.0)

## 2023-09-19 LAB — ACETAMINOPHEN LEVEL: Acetaminophen (Tylenol), Serum: <10 ug/mL — ABNORMAL LOW (ref 10–30)

## 2023-09-19 LAB — TROPONIN I (HIGH SENSITIVITY): Troponin I (High Sensitivity): 3 ng/L (ref <18)

## 2023-09-19 LAB — ETHANOL: Alcohol, Ethyl (B): <10 mg/dL (ref <10)

## 2023-09-19 MED ORDER — DIPHENHYDRAMINE HCL 25 MG PO CAPS: 50.0000 mg | ORAL_CAPSULE | Freq: Three times a day (TID) | ORAL | Status: DC | PRN

## 2023-09-19 MED ORDER — HALOPERIDOL 5 MG PO TABS: 5.0000 mg | ORAL_TABLET | Freq: Three times a day (TID) | ORAL | Status: DC | PRN

## 2023-09-19 MED ORDER — FLUOXETINE HCL 20 MG PO CAPS
20.0000 mg | ORAL_CAPSULE | Freq: Every day | ORAL | Status: DC
  Administered 2023-09-20 – 2023-09-26 (×7): 20 mg via ORAL
  Filled 2023-09-19 (×8): qty 1

## 2023-09-19 MED ORDER — ALBUTEROL SULFATE (2.5 MG/3ML) 0.083% IN NEBU: 3.0000 mL | INHALATION_SOLUTION | Freq: Four times a day (QID) | RESPIRATORY_TRACT | Status: DC | PRN

## 2023-09-19 MED ORDER — POTASSIUM CHLORIDE CRYS ER 20 MEQ PO TBCR
40.0000 meq | EXTENDED_RELEASE_TABLET | Freq: Once | ORAL | Status: AC
  Administered 2023-09-19: 40 meq via ORAL
  Filled 2023-09-19: qty 2

## 2023-09-19 MED ORDER — ATORVASTATIN CALCIUM 20 MG PO TABS
20.0000 mg | ORAL_TABLET | Freq: Every day | ORAL | Status: DC
  Administered 2023-09-19 – 2023-09-25 (×7): 20 mg via ORAL
  Filled 2023-09-19 (×7): qty 1
  Filled 2023-09-19: qty 2
  Filled 2023-09-19: qty 1
  Filled 2023-09-19: qty 2

## 2023-09-19 MED ORDER — HYDROXYZINE PAMOATE 25 MG PO CAPS: 25.0000 mg | ORAL_CAPSULE | Freq: Three times a day (TID) | ORAL | Status: DC | PRN

## 2023-09-19 MED ORDER — AMLODIPINE BESYLATE 5 MG PO TABS: 10.0000 mg | ORAL_TABLET | Freq: Every day | ORAL | Status: DC

## 2023-09-19 MED ORDER — AMLODIPINE BESYLATE 10 MG PO TABS
10.0000 mg | ORAL_TABLET | Freq: Every day | ORAL | Status: DC
  Administered 2023-09-20 – 2023-09-26 (×7): 10 mg via ORAL
  Filled 2023-09-19 (×8): qty 1

## 2023-09-19 MED ORDER — ATORVASTATIN CALCIUM 10 MG PO TABS: 20.0000 mg | ORAL_TABLET | Freq: Every day | ORAL | Status: DC

## 2023-09-19 MED ORDER — HALOPERIDOL 5 MG PO TABS: 10.0000 mg | ORAL_TABLET | Freq: Two times a day (BID) | ORAL | Status: DC

## 2023-09-19 MED ORDER — METFORMIN HCL ER 500 MG PO TB24
500.0000 mg | ORAL_TABLET | Freq: Two times a day (BID) | ORAL | Status: DC
  Administered 2023-09-19 – 2023-09-26 (×14): 500 mg via ORAL
  Filled 2023-09-19 (×18): qty 1

## 2023-09-19 MED ORDER — DIVALPROEX SODIUM ER 500 MG PO TB24: 500.0000 mg | ORAL_TABLET | Freq: Every day | ORAL | Status: DC

## 2023-09-19 MED ORDER — FLUOXETINE HCL 20 MG PO CAPS: 20.0000 mg | ORAL_CAPSULE | Freq: Every day | ORAL | Status: DC

## 2023-09-19 MED ORDER — MAGNESIUM HYDROXIDE 400 MG/5ML PO SUSP: 30.0000 mL | Freq: Every day | ORAL | Status: DC | PRN

## 2023-09-19 MED ORDER — DIPHENHYDRAMINE HCL 50 MG/ML IJ SOLN: 50.0000 mg | Freq: Three times a day (TID) | INTRAMUSCULAR | Status: DC | PRN

## 2023-09-19 MED ORDER — DIVALPROEX SODIUM ER 500 MG PO TB24
1000.0000 mg | ORAL_TABLET | Freq: Every day | ORAL | Status: DC
  Administered 2023-09-19 – 2023-09-20 (×2): 1000 mg via ORAL
  Filled 2023-09-19 (×5): qty 2

## 2023-09-19 MED ORDER — HALOPERIDOL 5 MG PO TABS
10.0000 mg | ORAL_TABLET | Freq: Two times a day (BID) | ORAL | Status: DC
  Administered 2023-09-19 – 2023-09-26 (×14): 10 mg via ORAL
  Filled 2023-09-19 (×17): qty 2

## 2023-09-19 MED ORDER — DIVALPROEX SODIUM ER 500 MG PO TB24
500.0000 mg | ORAL_TABLET | Freq: Every day | ORAL | Status: DC
  Filled 2023-09-19 (×2): qty 1

## 2023-09-19 MED ORDER — HALOPERIDOL LACTATE 5 MG/ML IJ SOLN: 5.0000 mg | Freq: Three times a day (TID) | INTRAMUSCULAR | Status: DC | PRN

## 2023-09-19 MED ORDER — ALUM & MAG HYDROXIDE-SIMETH 200-200-20 MG/5ML PO SUSP: 30.0000 mL | ORAL | Status: DC | PRN

## 2023-09-19 MED ORDER — PRAZOSIN HCL 1 MG PO CAPS
1.0000 mg | ORAL_CAPSULE | Freq: Every day | ORAL | Status: DC
  Administered 2023-09-19 – 2023-09-20 (×2): 1 mg via ORAL
  Filled 2023-09-19 (×5): qty 1

## 2023-09-19 MED ORDER — RAMELTEON 8 MG PO TABS
8.0000 mg | ORAL_TABLET | ORAL | Status: DC
  Filled 2023-09-19: qty 1

## 2023-09-19 MED ORDER — BENZTROPINE MESYLATE 1 MG PO TABS: 0.5000 mg | ORAL_TABLET | Freq: Two times a day (BID) | ORAL | Status: DC

## 2023-09-19 MED ORDER — METFORMIN HCL ER 500 MG PO TB24: 500.0000 mg | ORAL_TABLET | Freq: Two times a day (BID) | ORAL | Status: DC

## 2023-09-19 MED ORDER — QUETIAPINE FUMARATE 25 MG PO TABS: 25.0000 mg | ORAL_TABLET | Freq: Every day | ORAL | Status: DC

## 2023-09-19 MED ORDER — PRAZOSIN HCL 1 MG PO CAPS: 1.0000 mg | ORAL_CAPSULE | Freq: Every day | ORAL | Status: DC

## 2023-09-19 MED ORDER — RAMELTEON 8 MG PO TABS: 8.0000 mg | ORAL_TABLET | ORAL | Status: DC

## 2023-09-19 MED ORDER — NICOTINE 21 MG/24HR TD PT24
21.0000 mg | MEDICATED_PATCH | Freq: Every day | TRANSDERMAL | Status: DC
  Filled 2023-09-19: qty 1

## 2023-09-19 MED ORDER — BENZTROPINE MESYLATE 0.5 MG PO TABS
0.5000 mg | ORAL_TABLET | Freq: Two times a day (BID) | ORAL | Status: DC
  Administered 2023-09-19 – 2023-09-26 (×14): 0.5 mg via ORAL
  Filled 2023-09-19 (×17): qty 1

## 2023-09-19 MED ORDER — NICOTINE POLACRILEX 2 MG MT GUM: 2.0000 mg | CHEWING_GUM | OROMUCOSAL | Status: DC | PRN

## 2023-09-19 MED ORDER — HYDROXYZINE HCL 50 MG PO TABS: 25.0000 mg | ORAL_TABLET | Freq: Three times a day (TID) | ORAL | Status: DC | PRN

## 2023-09-19 MED ORDER — HYDROXYZINE HCL 25 MG PO TABS
25.0000 mg | ORAL_TABLET | Freq: Three times a day (TID) | ORAL | Status: DC | PRN
  Administered 2023-09-19 – 2023-09-25 (×7): 25 mg via ORAL
  Filled 2023-09-19 (×8): qty 1

## 2023-09-19 MED ORDER — RAMELTEON 8 MG PO TABS: 8.0000 mg | ORAL_TABLET | Freq: Every day | ORAL | Status: DC

## 2023-09-19 MED ORDER — HALOPERIDOL LACTATE 5 MG/ML IJ SOLN: 10.0000 mg | Freq: Three times a day (TID) | INTRAMUSCULAR | Status: DC | PRN

## 2023-09-19 MED ORDER — PANTOPRAZOLE SODIUM 40 MG PO TBEC
40.0000 mg | DELAYED_RELEASE_TABLET | Freq: Every day | ORAL | Status: DC
  Administered 2023-09-20 – 2023-09-26 (×7): 40 mg via ORAL
  Filled 2023-09-19 (×8): qty 1

## 2023-09-19 MED ORDER — QUETIAPINE FUMARATE 25 MG PO TABS
25.0000 mg | ORAL_TABLET | Freq: Every day | ORAL | Status: DC
  Administered 2023-09-19: 25 mg via ORAL
  Filled 2023-09-19 (×3): qty 1

## 2023-09-19 MED ORDER — PANTOPRAZOLE SODIUM 40 MG PO TBEC: 40.0000 mg | DELAYED_RELEASE_TABLET | Freq: Every day | ORAL | Status: DC

## 2023-09-19 MED ORDER — ALBUTEROL SULFATE HFA 108 (90 BASE) MCG/ACT IN AERS
1.0000 | INHALATION_SPRAY | Freq: Four times a day (QID) | RESPIRATORY_TRACT | Status: DC | PRN
  Administered 2023-09-23 – 2023-09-24 (×2): 1 via RESPIRATORY_TRACT
  Administered 2023-09-25: 2 via RESPIRATORY_TRACT
  Administered 2023-09-26: 1 via RESPIRATORY_TRACT
  Administered 2023-09-26: 2 via RESPIRATORY_TRACT
  Filled 2023-09-19: qty 6.7

## 2023-09-19 MED ORDER — ACETAMINOPHEN 325 MG PO TABS
650.0000 mg | ORAL_TABLET | Freq: Four times a day (QID) | ORAL | Status: DC | PRN
  Administered 2023-09-22: 650 mg via ORAL
  Filled 2023-09-19 (×2): qty 2

## 2023-09-19 NOTE — Group Note (Signed)
Date:  09/19/2023 Time:  10:19 PM  Group Topic/Focus:  Wrap-Up Group:   The focus of this group is to help patients review their daily goal of treatment and discuss progress on daily workbooks.    Participation Level:  Active  Participation Quality:  Appropriate and Attentive  Affect:  Appropriate  Cognitive:  Appropriate  Insight: Appropriate and Good  Engagement in Group:  Engaged  Modes of Intervention:  Discussion  Additional Comments:  Patient stated that he was havig a 6 out of 10 day.  Patient  stated that he believes that once he gets his medication adjusted he will be ready to be discharged   Alfonse Ras 09/19/2023, 10:19 PM

## 2023-09-19 NOTE — ED Provider Notes (Signed)
[place] EMERGENCY DEPARTMENT AT [facility] Provider Note   CSN: [id number] Arrival date & time: 09/19/23  7829     History  Chief Complaint  Patient presents with   Suicidal    Brian [name] is a 38 y.o. male with past medical history of 6 schizoaffective disorder, bipolar 1, PTSD, substance abuse presenting to the emergency room with complaint of 1 week of SI, HI.  Patient reports that he verbally threatened his mother at home.  He reports he is feeling like he might need his meds changed.  He has been taking all of his medication as described.  Denies any hallucinations, current substance use, current alcohol use.  Family member reports he has had chest pain on and off today. Patient reports not chest pain and that it is "just anxiety" denies past cardiac history, injury or fall. Denies chest pain, shortness of breath abdominal pain or other associated symptoms.  HPI     Home Medications Prior to Admission medications   Medication Sig Start Date End Date Taking? Authorizing Provider  amLODipine (NORVASC) 10 MG tablet Take 1 tablet (10 mg total) by mouth daily. 01/24/23   Claiborne Rigg, NP  atorvastatin (LIPITOR) 20 MG tablet Take 1 tablet (20 mg total) by mouth daily. Patient taking differently: Take 20 mg by mouth at bedtime. 01/24/23   Claiborne Rigg, NP  benztropine (COGENTIN) 0.5 MG tablet Take 1 Tablet By mouth 2 times per day 09/24/21     cetirizine (ZYRTEC) 10 MG tablet Take 10 mg by mouth daily.    [provider]  divalproex (DEPAKOTE ER) 500 MG 24 hr tablet TAKE 2 TABLET BY ORAL ROUTE PER AT BEDTIME 12/01/20 02/22/23  Claiborne Rigg, NP  famotidine (PEPCID) 20 MG tablet Take 20 mg by mouth 2 (two) times daily.    [provider]  FLUoxetine (PROZAC) 20 MG capsule Take 1 Capsule By Oral Route 1 time per day 01/08/21     haloperidol (HALDOL) 10 MG tablet Take 1 Tablet By Oral Route 3 times per day Patient taking differently: Take 10 mg by mouth  2 (two) times daily. 01/08/21     hydrOXYzine (VISTARIL) 25 MG capsule Take 25 mg by mouth 3 (three) times daily as needed for anxiety. 02/01/23   [provider]  metFORMIN (GLUCOPHAGE-XR) 500 MG 24 hr tablet Take 500 mg by mouth 2 (two) times daily with a meal. 02/10/23   [provider]  omeprazole (PRILOSEC) 20 MG capsule Take 1 capsule (20 mg total) by mouth daily. Patient taking differently: Take 20 mg by mouth every morning. 01/24/23   Claiborne Rigg, NP  oxyCODONE (OXY IR/ROXICODONE) 5 MG immediate release tablet Take 1 tablet (5 mg total) by mouth every 6 (six) hours as needed for severe pain. 02/22/23   Cornett, Maisie Fus, MD  prazosin (MINIPRESS) 1 MG capsule TAKE 1 CAPSULE BY ORAL ROUTE PER AT BEDTIME 11/18/20 02/22/23    QUEtiapine (SEROQUEL) 25 MG tablet TAKE 1 TABLET BY MOUTH AT BEDTIME AS NEEDED FOR SLEEP. Patient taking differently: Take 25 mg by mouth at bedtime. 11/18/20 02/22/23    ramelteon (ROZEREM) 8 MG tablet TAKE 1 TABLET BY ORAL ROUTE 1 TIME PER DAY AT BEDTIME Patient taking differently: Take 8 mg by mouth at bedtime. 11/18/20 02/22/23    VENTOLIN HFA 108 (90 Base) MCG/ACT inhaler Inhale 1-2 puffs into the lungs every 6 (six) hours as needed for wheezing or shortness of breath. 01/24/23   Meredeth Ide,  Shea Stakes, NP      Allergies    Phenytoin sodium extended, Pineapple, and Risperdal [risperidone]    Review of Systems   Review of Systems  Psychiatric/Behavioral:  Positive for suicidal ideas.     Physical Exam Updated Vital Signs BP (!) 140/85 (BP Location: Right Arm)   Pulse (!) 122   Temp (!) 97.5 F (36.4 C)   Resp (!) 22   Wt (!) 145 kg   SpO2 98%   BMI 43.35 kg/m  Physical Exam Vitals and nursing note reviewed.  Constitutional:      General: He is not in acute distress.    Appearance: He is not toxic-appearing.  HENT:     Head: Normocephalic and atraumatic.  Eyes:     General: No scleral icterus.    Conjunctiva/sclera: Conjunctivae normal.   Cardiovascular:     Rate and Rhythm: Normal rate and regular rhythm.     Pulses: Normal pulses.     Heart sounds: Normal heart sounds.  Pulmonary:     Effort: Pulmonary effort is normal. No respiratory distress.     Breath sounds: Normal breath sounds.  Abdominal:     General: Abdomen is flat. Bowel sounds are normal.     Palpations: Abdomen is soft.     Tenderness: There is no abdominal tenderness.  Musculoskeletal:     Right lower leg: No edema.     Left lower leg: No edema.  Skin:    General: Skin is warm and dry.     Findings: No lesion.  Neurological:     General: No focal deficit present.     Mental Status: He is alert and oriented to person, place, and time. Mental status is at baseline.     ED Results / Procedures / Treatments   Labs (all labs ordered are listed, but only abnormal results are displayed) Labs Reviewed  COMPREHENSIVE METABOLIC PANEL - Abnormal; Notable for the following components:      Result Value   Sodium 131 (*)    Potassium 3.3 (*)    Chloride 96 (*)    Glucose, Bld 167 (*)    BUN <5 (*)    All other components within normal limits  SALICYLATE LEVEL - Abnormal; Notable for the following components:   Salicylate Lvl <7.0 (*)    All other components within normal limits  ACETAMINOPHEN LEVEL - Abnormal; Notable for the following components:   Acetaminophen (Tylenol), Serum <10 (*)    All other components within normal limits  ETHANOL  CBC  RAPID URINE DRUG SCREEN, HOSP PERFORMED  TROPONIN I (HIGH SENSITIVITY)    EKG None  Radiology DG Chest 2 View Result Date: 09/19/2023 CLINICAL DATA:  Chest pain. EXAM: CHEST - 2 VIEW COMPARISON:  Chest radiograph dated February 22, 2017. FINDINGS: The heart size and mediastinal contours are within normal limits. No focal consolidation, pleural effusion, or pneumothorax. No acute osseous abnormality. IMPRESSION: No acute cardiopulmonary findings. Electronically Signed   By: Hart Robinsons M.D.   On:  09/19/2023 09:41    Procedures Procedures    Medications Ordered in ED Medications  potassium chloride SA (KLOR-CON M) CR tablet 40 mEq (has no administration in time range)    ED Course/ Medical Decision Making/ A&P                                 Medical Decision Making Amount and/or Complexity of Data  Reviewed Labs: ordered. Radiology: ordered.  Risk Prescription drug management.   This patient presents to the ED for concern of SI, this involves an extensive number of treatment options, and is a complaint that carries with it a high risk of complications and morbidity.  The differential diagnosis includes suicidal ideation, depression, anxiety, noncompliance, bipolar disorder   Co morbidities that complicate the patient evaluation  Bipolar    Lab Tests:  I personally interpreted labs.  The pertinent results include:   CBC without leukocytosis and no anemia.  CMP with sodium of 131, potassium 3.3 will supplement potassium here in emergency room.  Normal GFR and no elevation in liver enzymes.  Urine drug screen is within normal limits.  Acetaminophen under 10, salicylate under 7 and EtOH under 10.  Troponin 3   Imaging Studies ordered:  I ordered imaging studies including chest  I independently visualized and interpreted imaging which showed no acute abnormality I agree with the radiologist interpretation   Cardiac Monitoring: / EKG:  The patient was maintained on a cardiac monitor.  I personally viewed and interpreted the cardiac monitored which showed an underlying rhythm of: sinus    Consultations Obtained:  I requested consultation with the [facility],  and discussed lab and imaging findings as well as pertinent plan - they recommend: PENDING    Problem List / ED Course / Critical interventions / Medication management  Patient reporting to emergency room with complaint of SI.  Patient reports past week he has had increased depression and anxiety as well as  verbal outburst with family member at home.  Patient is hemodynamically stable, not hypoxic no signs of distress.  He does report he has had some intermittent chest tightness and he reports this is because he is anxious.  Patient reports he does not currently have any chest pain.  I did obtain chest x-ray, EKG and troponin which were within normal limits thus doubt ACS.  Denies any infectious symptoms.  No sign of fluid overload on exam. No sign of DVT and no SOB thus doubt PE.  Patient reports he feels very anxious suspect this could be contributing to symptoms.  At this point I reviewed labs imaging and EKG.  Patient is stable from medical standpoint. I ordered medication including potassium  Reevaluation of the patient after these medicines showed that the patient improved I have reviewed the patients home medicines and have made adjustments as needed   Plan  Patient is medically cleared to see psych.  I have ordered home medications and normal diet.  BH to see for SI/HI         Final Clinical Impression(s) / ED Diagnoses Final diagnoses:  Suicidal ideation    Rx / DC Orders ED Discharge Orders     None         Smitty Knudsen, PA-C 09/19/23 1521    Coral Spikes, DO 09/19/23 1533

## 2023-09-19 NOTE — Tx Team (Addendum)
Initial Treatment Plan 09/19/2023 7:01 PM [name] [id number]:[id number]    PATIENT STRESSORS: Financial difficulties   Health problems   Occupational concerns     PATIENT STRENGTHS: Capable of independent living  Supportive family/friends    PATIENT IDENTIFIED PROBLEMS: Acute Psychosis "I keep hearing voices, I take my medications but I want these voices to go away".    Health Concern "I'm almost diabetic, I worry about that".    Alterations in mood (Aggression) "I snapped on my mama today. I want to stop, that's why I left home".    Risk for self harm "I was suicidal because I snapped on my mom and these voices not going away".         DISCHARGE CRITERIA:  Improved stabilization in mood, thinking, and/or behavior Safe-care adequate arrangements made Verbal commitment to aftercare and medication compliance  PRELIMINARY DISCHARGE PLAN: Outpatient therapy Placement in alternative living arrangements  PATIENT/FAMILY INVOLVEMENT: This treatment plan has been presented to and reviewed with the patient, Brian [name].  The patient have been given the opportunity to ask questions and make suggestions.  Sherryl Manges, RN 09/19/2023, 7:01 PM

## 2023-09-19 NOTE — ED Provider Triage Note (Signed)
Emergency Medicine Provider Triage Evaluation Note  Brian [name] , a 38 y.o. male  was evaluated in triage.  Pt complains of SI/HI.  Review of Systems  Positive: SI/HI Negative: CP, SOB. Abdominal pain, NVD  Physical Exam  BP (!) 140/85 (BP Location: Right Arm)   Pulse (!) 122   Temp (!) 97.5 F (36.4 C)   Resp (!) 22   Wt (!) 145 kg   SpO2 98%   BMI 43.35 kg/m  Gen:   Awake, no distress   Resp:  Normal effort  MSK:   Moves extremities without difficulty  Other:    Medical Decision Making  Medically screening exam initiated at 7:52 AM.  Appropriate orders placed.  Brian [name] was informed that the remainder of the evaluation will be completed by another provider, this initial triage assessment does not replace that evaluation, and the importance of remaining in the ED until their evaluation is complete.  With past medical history of PTSD, bipolar 1, anxiety, depression, psychosis, substance abuse presenting to emergency with SI and HI.  This has been ongoing for 1 week.  It has resulted to threatening mother.  He is currently voluntary pleasant and cooperative.  Denies any additional complaints.   Smitty Knudsen, PA-C 09/19/23 1520

## 2023-09-19 NOTE — ED Provider Notes (Signed)
EKG without acute findings today.   Brian Sprout, MD 09/19/23 [phone]

## 2023-09-19 NOTE — Tx Team (Signed)
Initial Treatment Plan 09/19/2023 7:11 PM Brian [name] XLK:[id number]    PATIENT STRESSORS: Marital or family conflict     PATIENT STRENGTHS: Motivation for treatment/growth    PATIENT IDENTIFIED PROBLEMS:                      DISCHARGE CRITERIA:  Motivation to continue treatment in a less acute level of care Need for constant or close observation no longer present Verbal commitment to aftercare and medication compliance  PRELIMINARY DISCHARGE PLAN: Outpatient therapy  PATIENT/FAMILY INVOLVEMENT: This treatment plan has been presented to and reviewed with the patient, Brian [name].  The patient and family have been given the opportunity to ask questions and make suggestions.  Laurance Flatten, RN 09/19/2023, 7:11 PM

## 2023-09-19 NOTE — ED Notes (Signed)
Safe tx called

## 2023-09-19 NOTE — Progress Notes (Signed)
   09/19/23 1651  Psych Admission Type (Psych Patients Only)  Admission Status Voluntary  Psychosocial Assessment  Patient Complaints Depression  Eye Contact Fair  Facial Expression Animated  Affect Appropriate to circumstance  Speech Logical/coherent  Interaction Assertive  Motor Activity Slow  Appearance/Hygiene In scrubs  Behavior Characteristics Cooperative  Mood Depressed  Thought Process  Coherency WDL  Content WDL  Delusions None reported or observed  Perception WDL  Hallucination Auditory;Visual  Judgment Impaired  Confusion None  Danger to Self  Current suicidal ideation? Passive  Agreement Not to Harm Self Yes  Description of Agreement verbal  Danger to Others  Danger to Others None reported or observed   Pt cooperative upon admission assessment. Pt currently reports passive SI but contracts for safety and denies having a plan at this time.

## 2023-09-19 NOTE — ED Notes (Signed)
Report was given at 3:21pm to [facility] art [facility]

## 2023-09-19 NOTE — Progress Notes (Signed)
Pt has been accepted to [facility] on 09/19/2023 Bed assignment: 306-1   Pt meets inpatient criteria per: Eligha Bridegroom NP  Attending Physician will be: Dr. Sarita Bottom, MD   Report can be called to: Adult unit: [phone]  Pt can arrive after pending updated VS and voluntary consent.   Care Team Notified: [facility] Rona Ravens RN, [facility] RN, Eligha Bridegroom NP, Cleotis Lema RN, Devinny Harrington NT   [place] Jael Waldorf LCSW-A   09/19/2023 2:33 PM

## 2023-09-19 NOTE — Progress Notes (Signed)
   09/19/23 2111  Psych Admission Type (Psych Patients Only)  Admission Status Voluntary  Psychosocial Assessment  Patient Complaints Anxiety;Depression;Worrying  Eye Contact Fair  Facial Expression Anxious  Affect Depressed  Speech Logical/coherent  Interaction Assertive  Motor Activity Slow  Appearance/Hygiene In scrubs  Behavior Characteristics Cooperative  Mood Depressed;Anxious  Thought Process  Coherency WDL  Content WDL  Delusions None reported or observed  Perception Hallucinations  Hallucination Auditory  Judgment Impaired  Confusion None  Danger to Self  Current suicidal ideation? Denies  Agreement Not to Harm Self Yes  Description of Agreement verbal  Danger to Others  Danger to Others None reported or observed

## 2023-09-19 NOTE — Consult Note (Cosign Needed Addendum)
[facility] Psychiatric Consult Initial  Patient Name: .Brian [name]  MRN: [id number]  DOB: Oct 22, 1985  Consult Order details:  Orders (From admission, onward)     Start     Ordered   09/19/23 1221  CONSULT TO CALL ACT TEAM       Ordering Provider: Smitty Knudsen, PA-C  Provider:  (Not yet assigned)  Question:  Reason for Consult?  Answer:  Psych consult   09/19/23 1221             Mode of Visit: In person    Psychiatry Consult Evaluation  Service Date: September 19, 2023 LOS:  LOS: 0 days  Chief Complaint suicidal  Primary Psychiatric Diagnoses  MDD, recurrent, severe without psychosis 2.  Schizoaffective disorder 3.    Assessment  Brian [name] is a 38 y.o. male admitted: Presented to the EDfor 09/19/2023  7:17 AM for suicidal ideations. He carries the psychiatric diagnoses of schizoaffective disorder and MDD.   Current outpatient psychotropic medications include see MAR and historically he has had a positive response to these medications. He was compliant with medications prior to admission as evidenced by patient report. On initial examination, patient is pleasant, cooperative, and continues to endorse SI with plan to hang himself. Please see plan below for detailed recommendations.   Diagnoses:  Active Hospital problems: Principal Problem:   MDD (major depressive disorder), recurrent episode, severe (HCC) Active Problems:   Schizoaffective disorder, bipolar type (HCC)   Suicidal ideations    Plan   ## Psychiatric Medication Recommendations:  - Continue home medications. I called and spoke with his Mother, Brian [name], who confirmed his current medication list  ## Medical Decision Making Capacity: Not specifically addressed in this encounter  ## Further Work-up:  -- EKG pending -- Pertinent labwork reviewed earlier this admission includes: CBC, CMP, UDS   ## Disposition:-- We recommend inpatient psychiatric hospitalization when medically cleared. Patient  is under voluntary admission status at this time; please IVC if attempts to leave hospital.  ## Behavioral / Environmental: - No specific recommendations at this time.     ## Safety and Observation Level:  - Based on my clinical evaluation, I estimate the patient to be at low risk of self harm in the current setting. - At this time, we recommend  routine. This decision is based on my review of the chart including patient's history and current presentation, interview of the patient, mental status examination, and consideration of suicide risk including evaluating suicidal ideation, plan, intent, suicidal or self-harm behaviors, risk factors, and protective factors. This judgment is based on our ability to directly address suicide risk, implement suicide prevention strategies, and develop a safety plan while the patient is in the clinical setting. Please contact our team if there is a concern that risk level has changed.  CSSR Risk Category:C-SSRS RISK CATEGORY: High Risk  Suicide Risk Assessment: Patient has following modifiable risk factors for suicide: active suicidal ideation, which we are addressing by inpatient treatment. Patient has following non-modifiable or demographic risk factors for suicide: male gender Patient has the following protective factors against suicide: Access to outpatient mental health care, Supportive family, Cultural, spiritual, or religious beliefs that discourage suicide, and Frustration tolerance  Thank you for this consult request. Recommendations have been communicated to the primary team.  We will recommend IP treatment at this time.   Eligha Bridegroom, NP       History of Present Illness  Relevant Aspects of Hospital ED Course:  Admitted  on 09/19/2023 he presents to [facility] from home endorsing active suicidal ideations with a plan to hang himself. Pt is followed closely by [facility] for OP treatment. Pt does endorse increased stress recently, mostly he and his mother  have been arguing more and they live together. Pt stated he is unhappy with his life. Denies HI. Denies AVH.  Patient Report:  Pt seen at [facility] for face to face psychiatric evaluation. Pt continues to endorse SI, with plan to hang himself. Pt shows me the scar across his neck and on his right forearm, where he previously had tried to kill himself before. He states he tried cutting his neck and tried cutting his wrists but both were failed attempts many years ago. He denies HI. Denies AVH. Denies illicit substance or alcohol use. He does confirm he is followed by [facility] for OP. Pt states he has no reasons to live, and is very dissatisfied with his life. He lives at home with his mom and receives disability for his mental health. He and his mother have been arguing more frequently, and he is "at his limit" and is either going to "snap" and kill her or kill himself. He states he does not want to hurt his mom but is afraid of what he might do when he's angry. He endorses feelings of suicide, increased depression, insomnia, low self esteem, isolation, and anhedonia. Pt is not able to contract for safety if discharged, and is agreeable with inpatient psychiatric treatment.   Psych ROS:  Depression: yes Anxiety:  yes Mania (lifetime and current): lifetime Psychosis: (lifetime and current): denies  Collateral information:   I spoke with his mother, Brian [name], who confirmed the patient does live with her. To her knowledge he has been compliant with his medications. He has been doing very well for many years, but feels like he has been sleeping less recently and they have been arguing. Pt has been more irritable recently. She did not know he was feeling suicidal until today when he told her. She brought him straight to ED since he does have a history of suicide attempts. She is wanting him to get inpatient treatment.  Mother also mentions to not use any addictive or scheduled medications on the patient, as he  was previously an addict. She mentions at hospitals he will sometimes pretend to be in pain and be drug seeking. Did inform her the information would be passed along to staff.   Review of Systems  Psychiatric/Behavioral:  Positive for depression and suicidal ideas.   All other systems reviewed and are negative.    Psychiatric and Social History  Psychiatric History:  Information collected from patient  Prev Dx/Sx: schizoaffective disorder [facility] (current): see MAR Previous Med Trials: unknown Therapy: yes  Prior Psych Hospitalization: yes  Prior Self Harm: yes Prior Violence: denies  Family Psych History: unknown Family Hx suicide: unknown  Social History:  Developmental Hx: wdl Educational Hx: high school Occupational Hx: Print production planner Hx: denies Living Situation: lives with mother Spiritual Hx: yes Access to weapons/lethal means: denies    Substance History Alcohol: denies  Tobacco: yes daily Illicit drugs: denies, previous drug use Prescription drug abuse: denies Rehab hx: unknown  Exam Findings  Physical Exam:  Vital Signs:  Temp:  [97.5 F (36.4 C)] 97.5 F (36.4 C) (01/27 0721) Pulse Rate:  [122] 122 (01/27 0721) Resp:  [22] 22 (01/27 0721) BP: (140)/(85) 140/85 (01/27 0721) SpO2:  [98 %] 98 % (01/27  1610) Weight:  [145 kg] 145 kg (01/27 0723) Blood pressure (!) 140/85, pulse (!) 122, temperature (!) 97.5 F (36.4 C), resp. rate (!) 22, weight (!) 145 kg, SpO2 98%. Body mass index is 43.35 kg/m.  Physical Exam Vitals and nursing note reviewed.  Neurological:     Mental Status: He is alert and oriented to person, place, and time.  Psychiatric:        Attention and Perception: Attention normal.        Mood and Affect: Mood is depressed.        Speech: Speech normal.        Behavior: Behavior is cooperative.        Thought Content: Thought content normal.     Mental Status Exam: General  Appearance: Well Groomed  Orientation:  Full (Time, Place, and Person)  Memory:  Immediate;   Fair Recent;   Fair  Concentration:  Concentration: Fair  Recall:  Good  Attention  Good  Eye Contact:  Good  Speech:  Clear and Coherent  Language:  Good  Volume:  Normal  Mood: "not great"  Affect:  Depressed  Thought Process:  Coherent  Thought Content:  WDL  Suicidal Thoughts:  Yes.  with intent/plan  Homicidal Thoughts:  No  Judgement:  Fair  Insight:  Fair  Psychomotor Activity:  Normal  Akathisia:  No  Fund of Knowledge:  Fair      Assets:  Communication Skills Desire for Improvement Housing Leisure Time Physical Health  Cognition:  WNL  ADL's:  Intact  AIMS (if indicated):        Other History   These have been pulled in through the EMR, reviewed, and updated if appropriate.  Family History:  The patient's family history includes Diabetes in his father and paternal grandmother; Hypertension in his father; Stroke in his maternal grandmother and mother.  Medical History: Past Medical History:  Diagnosis Date   Anxiety    Asthma    Bipolar 1 disorder (HCC)    Depression    GERD (gastroesophageal reflux disease)    Hepatitis C    treated and no longer has it   Hypertension    Pre-diabetes    Psychosis (HCC)    schizophrenia   PTSD (post-traumatic stress disorder)    Substance abuse (HCC)    in remission   Umbilical hernia     Surgical History: Past Surgical History:  Procedure Laterality Date   HIP SURGERY     laceration repair to right groin area   MOUTH SURGERY     pulled all teeth   UMBILICAL HERNIA REPAIR N/A 02/22/2023   Procedure: HERNIA REPAIR UMBILICAL;  Surgeon: Harriette Bouillon, MD;  Location: [facility];  Service: General;  Laterality: N/A;     Medications:   Current Facility-Administered Medications:    albuterol (VENTOLIN HFA) 108 (90 Base) MCG/ACT inhaler 1-2 puff, 1-2 puff, Inhalation, Q6H PRN, Eligha Bridegroom, NP    [START ON 09/20/2023] amLODipine (NORVASC) tablet 10 mg, 10 mg, Oral, Daily, Eligha Bridegroom, NP   atorvastatin (LIPITOR) tablet 20 mg, 20 mg, Oral, QHS, Vadie Principato, Glynda Jaeger, NP   benztropine (COGENTIN) tablet 0.5 mg, 0.5 mg, Oral, BID, Eligha Bridegroom, NP   divalproex (DEPAKOTE ER) 24 hr tablet 500 mg, 500 mg, Oral, QHS, Cyntha Brickman, Glynda Jaeger, NP   [START ON 09/20/2023] FLUoxetine (PROZAC) capsule 20 mg, 20 mg, Oral, Daily, Eligha Bridegroom, NP   haloperidol (HALDOL) tablet 10 mg, 10 mg, Oral, BID, Eligha Bridegroom, NP  hydrOXYzine (VISTARIL) capsule 25 mg, 25 mg, Oral, TID PRN, Eligha Bridegroom, NP   metFORMIN (GLUCOPHAGE-XR) 24 hr tablet 500 mg, 500 mg, Oral, BID WC, Charlise Giovanetti, Glynda Jaeger, NP   nicotine (NICODERM CQ - dosed in mg/24 hours) patch 21 mg, 21 mg, Transdermal, Daily, Eligha Bridegroom, NP   [START ON 09/20/2023] pantoprazole (PROTONIX) EC tablet 40 mg, 40 mg, Oral, Daily, Eligha Bridegroom, NP   potassium chloride SA (KLOR-CON M) CR tablet 40 mEq, 40 mEq, Oral, Once, Barrett, Jamie N, PA-C   prazosin (MINIPRESS) capsule 1 mg, 1 mg, Oral, QHS, Ketzia Guzek, Glynda Jaeger, NP   QUEtiapine (SEROQUEL) tablet 25 mg, 25 mg, Oral, QHS, Uriyah Massimo, NP   ramelteon (ROZEREM) tablet 8 mg, 8 mg, Oral, QHS, Eligha Bridegroom, NP  Current Outpatient Medications:    amLODipine (NORVASC) 10 MG tablet, Take 1 tablet (10 mg total) by mouth daily., Disp: 90 tablet, Rfl: 1   atorvastatin (LIPITOR) 20 MG tablet, Take 1 tablet (20 mg total) by mouth daily. (Patient taking differently: Take 20 mg by mouth at bedtime.), Disp: 90 tablet, Rfl: 1   benztropine (COGENTIN) 0.5 MG tablet, Take 1 Tablet By mouth 2 times per day, Disp: 60 tablet, Rfl: 3   divalproex (DEPAKOTE ER) 500 MG 24 hr tablet, TAKE 2 TABLET BY ORAL ROUTE PER AT BEDTIME, Disp: 60 tablet, Rfl: 2   FLUoxetine (PROZAC) 20 MG capsule, Take 1 Capsule By Oral Route 1 time per day, Disp: 30 capsule, Rfl: 2   haloperidol (HALDOL) 10 MG tablet, Take 1 Tablet By Oral  Route 3 times per day (Patient taking differently: Take 10 mg by mouth 2 (two) times daily.), Disp: 90 tablet, Rfl: 2   hydrOXYzine (VISTARIL) 25 MG capsule, Take 25 mg by mouth 3 (three) times daily as needed for anxiety., Disp: , Rfl:    metFORMIN (GLUCOPHAGE-XR) 500 MG 24 hr tablet, Take 500 mg by mouth 2 (two) times daily with a meal., Disp: , Rfl:    omeprazole (PRILOSEC) 20 MG capsule, Take 1 capsule (20 mg total) by mouth daily. (Patient taking differently: Take 20 mg by mouth every morning.), Disp: 90 capsule, Rfl: 3   prazosin (MINIPRESS) 1 MG capsule, TAKE 1 CAPSULE BY ORAL ROUTE PER AT BEDTIME, Disp: 30 capsule, Rfl: 2   QUEtiapine (SEROQUEL) 25 MG tablet, TAKE 1 TABLET BY MOUTH AT BEDTIME AS NEEDED FOR SLEEP. (Patient taking differently: Take 25 mg by mouth at bedtime.), Disp: 30 tablet, Rfl: 2   ramelteon (ROZEREM) 8 MG tablet, TAKE 1 TABLET BY ORAL ROUTE 1 TIME PER DAY AT BEDTIME (Patient taking differently: Take 8 mg by mouth at bedtime.), Disp: 30 tablet, Rfl: 2   VENTOLIN HFA 108 (90 Base) MCG/ACT inhaler, Inhale 1-2 puffs into the lungs every 6 (six) hours as needed for wheezing or shortness of breath., Disp: 18 g, Rfl: 3  Allergies: Allergies  Allergen Reactions   Phenytoin Sodium Extended Other (See Comments)    Gets sick to his stomach, and closes throat up   Pineapple Anaphylaxis   Risperdal [Risperidone] Shortness Of Breath    'throat closes upEligha Bridegroom, NP
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
## 2023-09-19 NOTE — Plan of Care (Signed)
Problem: Coping: Goal: Will verbalize feelings Outcome: Progressing

## 2023-09-20 DIAGNOSIS — F251 Schizoaffective disorder, depressive type: Secondary | ICD-10-CM | POA: Diagnosis not present

## 2023-09-20 DIAGNOSIS — F17200 Nicotine dependence, unspecified, uncomplicated: Secondary | ICD-10-CM | POA: Insufficient documentation

## 2023-09-20 DIAGNOSIS — F4310 Post-traumatic stress disorder, unspecified: Secondary | ICD-10-CM | POA: Insufficient documentation

## 2023-09-20 DIAGNOSIS — F411 Generalized anxiety disorder: Secondary | ICD-10-CM | POA: Insufficient documentation

## 2023-09-20 MED ORDER — TRAZODONE HCL 50 MG PO TABS
50.0000 mg | ORAL_TABLET | Freq: Every evening | ORAL | Status: DC | PRN
  Administered 2023-09-21 – 2023-09-22 (×2): 50 mg via ORAL
  Filled 2023-09-20: qty 1

## 2023-09-20 MED ORDER — TRAZODONE HCL 50 MG PO TABS
50.0000 mg | ORAL_TABLET | Freq: Every day | ORAL | Status: DC
  Administered 2023-09-20: 50 mg via ORAL
  Filled 2023-09-20 (×3): qty 1

## 2023-09-20 MED ORDER — NICOTINE 14 MG/24HR TD PT24
14.0000 mg | MEDICATED_PATCH | Freq: Every day | TRANSDERMAL | Status: DC
  Filled 2023-09-20 (×7): qty 1

## 2023-09-20 MED ORDER — RAMELTEON 8 MG PO TABS
8.0000 mg | ORAL_TABLET | Freq: Every day | ORAL | Status: DC
  Administered 2023-09-20 – 2023-09-25 (×6): 8 mg via ORAL
  Filled 2023-09-20 (×7): qty 1

## 2023-09-20 NOTE — H&P (Signed)
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 5-7 days  -- Discharge Concerns: Need to establish a safety plan; Medication compliance and effectiveness  -- Discharge Goals: Return home with outpatient referrals for mental health follow-up including medication management/psychotherapy    I certify that inpatient services furnished can reasonably be expected to improve the patient's condition.    Phineas Inches, MD 1/28/20252:09 PM  Total Time Spent in Direct Patient Care:  I personally spent 60 minutes on the unit in direct patient care. The direct patient care time included face-to-face time with the patient, reviewing the patient's chart, communicating with  other professionals, and coordinating care. Greater than 50% of this time was spent in counseling or coordinating care with the patient regarding goals of hospitalization, psycho-education, and discharge planning needs.   Phineas Inches, MD Psychiatrist

## 2023-09-20 NOTE — Progress Notes (Signed)
   09/20/23 0555  15 Minute Checks  Location Bedroom  Visual Appearance Calm  Behavior Sleeping  Sleep (Behavioral Health Patients Only)  Calculate sleep? (Click Yes once per 24 hr at 0600 safety check) Yes  Documented sleep last 24 hours 7.25

## 2023-09-20 NOTE — Plan of Care (Signed)
Problem: Education: Goal: Knowledge of [facility] Education information/materials will improve Outcome: Progressing Goal: Emotional status will improve Outcome: Progressing Goal: Verbalization of understanding the information provided will improve Outcome: Progressing   Problem: Activity: Goal: Sleeping patterns will improve Outcome: Progressing   Problem: Coping: Goal: Ability to verbalize frustrations and anger appropriately will improve Outcome: Progressing Goal: Ability to demonstrate self-control will improve Outcome: Progressing

## 2023-09-20 NOTE — Group Note (Signed)
LCSW Group Therapy Note   Group Date: 09/19/2023 Start Time: 11:00 End Time: 12:00   Type of Therapy:  Group Therapy   Topic:  Finding Balance: Using Wise Mind for Thoughtful Decisions  Participation:   patient was present, minimally participated in the conversation and was very polite.  Objective:  the objective of this class is to help participants understand the concept of [place]le Mind and learn how to apply it to real-life situations to make balanced, thoughtful decisions. Participants will gain tools to manage emotions, consider logic, and find a middle ground that leads to healthier responses and outcomes.  Goals: Understand the concept of Wise Mind.  Participants will learn the difference between Emotional Mind, Reasonable Mind, and [place] Settle Mind, and how [place] Sett[place] Mind helps in balancing emotions and logic to make thoughtful decisions. Recognize when you're in Emotional Mind or Reasonable Mind.  Participants will identify the signs of Emotional Mind and Reasonable Mind in their own reactions to situations and understand how to move into Wise Mind for more balanced responses. Practice applying [place] Sett[place] Mind to real-life situations.  Through scenarios and group activities, participants will practice using Wise Mind in everyday situations, learning how to acknowledge their emotions, think logically, and create solutions that are thoughtful and balanced.  Summary: In this class, we explored the concept of Wise Mind--the balance between Emotional Mind and Reasonable Mind. We discussed how Emotional Mind can sometimes lead to impulsive, reactive decisions driven by intense feelings, and how Reasonable Mind might ignore feelings altogether, focusing only on facts and logic. West[place] Settle Mind is the middle ground that combines both, allowing you to consider your emotions and use logic to make balanced, thoughtful decisions.  We learned how to recognize when we're in Emotional Mind or Reasonable Mind, and practiced  using Wise Mind in real-life situations. By using Alfonse Flavors, we can improve how we handle challenging situations, make better decisions, and strengthen our relationships with others.  Therapeutic Modalities: Elements of DBT   Brian [name], LCSWA 09/20/2023  1:21 PM

## 2023-09-20 NOTE — BHH Suicide Risk Assessment (Signed)
Brian [name] Brian [name] [name], Brian [name] Brian [name] [name], Brian [name] Brian [name] [name], Brian [name] Brian [name] [name]:   Schizoaffective Brian [name] [name], Brian [name] type (HCC) Active Problems:   GAD (generalized anxiety Brian [name] [name])   PTSD (post-traumatic stress Brian [name] [name])   Tobacco use Brian [name] [name]  Subjective Data: See H&P.  Patient reports having SI at this current time, also preceding admission.  Reports the HI that he was having leading up to this admission has resolved, that was directed towards his mother.  Reports also having command auditory hallucinations to harm himself and harm others.  Reports he is taking medication consistently is unsure why he is psychiatrically decompensated.  Continued Clinical Symptoms:    The "Alcohol Use Disorders Identification Test", Guidelines for Use in Primary Care, Second Edition.  World Science writer [facility]). Score between 0-7:  no or Brian [name] risk or alcohol related problems. Score between 8-15:  moderate risk of alcohol related problems. Score between 16-19:  high risk of alcohol related problems. Score 20 or above:  warrants further diagnostic evaluation for alcohol dependence and treatment.   CLINICAL FACTORS:   Severe Anxiety and/or Agitation Depression:   Impulsivity Insomnia Schizophrenia:   Command hallucinatons Brian [name] state Less than 31 years old More than one psychiatric diagnosis Currently Psychotic Unstable or Poor Therapeutic Relationship Previous Psychiatric Diagnoses and Treatments   Psychiatric Specialty Exam:  Presentation   General Appearance:  Disheveled  Eye Contact: Fair  Speech: Slow  Speech Volume: Normal  Handedness:No data recorded  Mood and Affect  Mood: Anxious; Depressed  Affect: Constricted   Thought Process  Thought Processes: Linear  Descriptions of Associations:Intact  Orientation:Full (Time, Place and Person)  Thought Content:Logical  History of Schizophrenia/Schizoaffective Brian [name] [name]:No data recorded Duration of Psychotic Symptoms:No data recorded Hallucinations:Hallucinations: Auditory; Visual  Ideas of Reference:Paranoia  Suicidal Thoughts:Suicidal Thoughts: Yes, Passive SI Passive Intent and/or Plan: Without Intent; Without Plan  Homicidal Thoughts:Homicidal Thoughts: Yes, Passive   Sensorium  Memory: Immediate Fair; Recent Fair; Remote Fair  Judgment: Impaired  Insight: Lacking   Executive Functions  Concentration: Poor  Attention Span: Poor  Recall:No data recorded Fund of Knowledge:No data recorded Language:No data recorded  Psychomotor Activity  Psychomotor Activity: Psychomotor Activity: Normal   Assets  Assets:No data recorded  Sleep  Sleep: Sleep: Poor    Physical Exam: Physical Exam see H&P ROS see H&P Blood pressure (!) 143/87, pulse (!) 104, temperature 97.8 F (36.6 C), temperature source Oral, resp. rate 20, height 6\' 1"  (1.854 m), weight (!) 147.4 kg, SpO2 97%. Body mass index is 42.88 kg/m.   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  PLAN OF CARE: See H&P  I certify that inpatient services furnished can reasonably be expected to improve the patient's  condition.   Phineas Inches, MD 09/20/2023, 2:07 PM
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
## 2023-09-20 NOTE — Progress Notes (Signed)
Pt returned from the cafeteria after a verbal altercation with a peer.  Pt returned to the unit anxious.  He was encouraged to take a shower and relax.  Pt agreed to this and  after getting prn medication for anxiety took a shower.  When he next came to the nursing station he was calmer. Staff will cont to monitor for safety.

## 2023-09-20 NOTE — BHH Group Notes (Signed)
BHH Group Notes:  (Nursing/MHT/Case Management/Adjunct)  Date:  09/20/2023  Time:  2000  Type of Therapy:   wrap up group  Participation Level:  Active  Participation Quality:  Appropriate and Attentive  Affect:  Appropriate  Cognitive:  Alert and Appropriate  Insight:  Appropriate and Good  Engagement in Group:  Engaged  Modes of Intervention:  Discussion  Summary of Progress/Problems:  Fay Records 09/20/2023, 10:05 PM

## 2023-09-20 NOTE — Progress Notes (Signed)
   09/20/23 1300  Psych Admission Type (Psych Patients Only)  Admission Status Voluntary  Psychosocial Assessment  Patient Complaints Depression  Eye Contact Fair  Facial Expression Anxious  Affect Depressed  Speech Logical/coherent  Interaction Assertive  Motor Activity Slow  Appearance/Hygiene In scrubs  Behavior Characteristics Cooperative  Mood Depressed;Anxious  Thought Process  Coherency WDL  Content WDL  Delusions None reported or observed  Perception WDL  Hallucination Auditory  Judgment Limited  Confusion None  Danger to Self  Current suicidal ideation? Denies  Agreement Not to Harm Self Yes  Description of Agreement verbal  Danger to Others  Danger to Others None reported or observed

## 2023-09-20 NOTE — Group Note (Signed)
Date:  09/20/2023 Time:  4:30 PM  Group Topic/Focus:  Goals Group:   The focus of this group is to help patients establish daily goals to achieve during treatment and discuss how the patient can incorporate goal setting into their daily lives to aide in recovery. Orientation:   The focus of this group is to educate the patient on the purpose and policies of crisis stabilization and provide a format to answer questions about their admission.  The group details unit policies and expectations of patients while admitted.    Participation Level:  Did Not Attend  Participation Quality:   n/a  Affect:   n/a  Cognitive:   n/a  Insight: None  Engagement in Group:   n/a  Modes of Intervention:   n/a  Additional Comments:   Pt did not attend.  Edmund Hilda Theon Sobotka 09/20/2023, 4:30 PM

## 2023-09-20 NOTE — Group Note (Addendum)
Recreation Therapy Group Note   Group Topic:Animal Assisted Therapy   Group Date: 09/20/2023 Start Time: 0944 End Time: 1030 Facilitators: Slayton Lubitz-McCall, LRT,CTRS Location: [address] Dayroom   Animal-Assisted Activity (AAA) Program Checklist/Progress Notes Patient Eligibility Criteria Checklist & Daily Group note for Rec Tx Intervention  AAA/T Program Assumption of Risk Form signed by Patient/ or Parent Legal Guardian Yes  Patient understands his/her participation is voluntary Yes  Education: Charity fundraiser, Appropriate Animal Interaction   Education Outcome: Acknowledges education.    Affect/Mood: N/A   Participation Level: Did not attend    Clinical Observations/Individualized Feedback:    Plan: Continue to engage patient in RT group sessions 2-3x/week.   [name], LRT,CTRS 09/20/2023 1:49 PM

## 2023-09-21 ENCOUNTER — Encounter (HOSPITAL_COMMUNITY): Payer: Self-pay

## 2023-09-21 DIAGNOSIS — F251 Schizoaffective disorder, depressive type: Secondary | ICD-10-CM | POA: Diagnosis not present

## 2023-09-21 LAB — BASIC METABOLIC PANEL
Anion gap: 12 (ref 5–15)
BUN: 7 mg/dL (ref 6–20)
CO2: 27 mmol/L (ref 22–32)
Calcium: 9.6 mg/dL (ref 8.9–10.3)
Chloride: 98 mmol/L (ref 98–111)
Creatinine, Ser: 0.38 mg/dL — ABNORMAL LOW (ref 0.61–1.24)
GFR, Estimated: >60 mL/min (ref >60)
Glucose, Bld: 140 mg/dL — ABNORMAL HIGH (ref 70–99)
Potassium: 4.2 mmol/L (ref 3.5–5.1)
Sodium: 137 mmol/L (ref 135–145)

## 2023-09-21 LAB — LIPID PANEL
Cholesterol: 160 mg/dL (ref 0–200)
HDL: 29 mg/dL — ABNORMAL LOW (ref >40)
LDL Cholesterol: 90 mg/dL (ref 0–99)
Total CHOL/HDL Ratio: 5.5 ratio
Triglycerides: 207 mg/dL — ABNORMAL HIGH (ref <150)
VLDL: 41 mg/dL — ABNORMAL HIGH (ref 0–40)

## 2023-09-21 LAB — HEMOGLOBIN A1C
Hgb A1c MFr Bld: 6.2 % — ABNORMAL HIGH (ref 4.8–5.6)
Mean Plasma Glucose: 131.24 mg/dL

## 2023-09-21 LAB — VALPROIC ACID LEVEL: Valproic Acid Lvl: 66 ug/mL (ref 50.0–100.0)

## 2023-09-21 LAB — TSH: TSH: 1.972 u[IU]/mL (ref 0.350–4.500)

## 2023-09-21 MED ORDER — CLONIDINE HCL 0.1 MG PO TABS: 0.1000 mg | ORAL_TABLET | Freq: Two times a day (BID) | ORAL | Status: DC

## 2023-09-21 MED ORDER — DIVALPROEX SODIUM ER 500 MG PO TB24
1500.0000 mg | ORAL_TABLET | Freq: Every day | ORAL | Status: DC
  Administered 2023-09-21 – 2023-09-25 (×5): 1500 mg via ORAL
  Filled 2023-09-21 (×7): qty 3

## 2023-09-21 MED ORDER — CLONIDINE HCL 0.1 MG PO TABS
0.1000 mg | ORAL_TABLET | Freq: Two times a day (BID) | ORAL | Status: DC
  Administered 2023-09-21 – 2023-09-26 (×10): 0.1 mg via ORAL
  Filled 2023-09-21 (×14): qty 1

## 2023-09-21 MED ORDER — CLONIDINE HCL 0.2 MG PO TABS
0.2000 mg | ORAL_TABLET | Freq: Two times a day (BID) | ORAL | Status: DC
  Filled 2023-09-21 (×2): qty 1

## 2023-09-21 MED ORDER — CLONIDINE HCL 0.1 MG PO TABS
0.1000 mg | ORAL_TABLET | ORAL | Status: DC | PRN
  Administered 2023-09-21: 0.1 mg via ORAL
  Filled 2023-09-21: qty 1

## 2023-09-21 MED ORDER — PROPRANOLOL HCL 10 MG PO TABS
10.0000 mg | ORAL_TABLET | Freq: Two times a day (BID) | ORAL | Status: DC
  Administered 2023-09-21 – 2023-09-26 (×11): 10 mg via ORAL
  Filled 2023-09-21 (×16): qty 1

## 2023-09-21 MED ORDER — CLONIDINE HCL 0.1 MG PO TABS: 0.1000 mg | ORAL_TABLET | ORAL | Status: DC | PRN

## 2023-09-21 MED ORDER — CLONIDINE HCL 0.1 MG PO TABS: 0.2000 mg | ORAL_TABLET | ORAL | Status: DC | PRN

## 2023-09-21 NOTE — BHH Suicide Risk Assessment (Signed)
BHH INPATIENT:  Family/Significant Other Suicide Prevention Education  Suicide Prevention Education:  Education Completed; Brian [name] - mother [phone]) has been identified by the patient as the family member/significant other with whom the patient will be residing, and identified as the person(s) who will aid the patient in the event of a mental health crisis (suicidal ideations/suicide attempt).  With written consent from the patient, the family member/significant other has been provided the following suicide prevention education, prior to the and/or following the discharge of the patient.   Brian [name] shares that Pt needs medication adjustment due to some recent changes in behavior. She denies safety concerns for herself and is agreeable to Pt returning to the home. She confirmed that she is his payee - as deemed by social security. Per mother, Brian [name] has historically made complaints about wanting to manage his own money and she continues to manage in an effort to prevent a relapse- specifically alcohol. She also shares that she does provide Brian [name] with transportation to his psychiatric appointments but is sometimes concerned about Brian [name] being able to manipulate physicians during virtual visits. Lastly, she admits [place] making it dificult for Brian [name] to [employer] healthy constructive time. She is planning to visit this evening. She would like him to return home and NOT go to a group home or [employer]. She confirms that this is also something that Brian [name] has done in the past, however he typically signs himself out and calls mother to retreive him. CSW will follow-up with patient regarding phone call with mother and discharge plans.   The suicide prevention education provided includes the following: Suicide risk factors Suicide prevention and interventions National Suicide Hotline telephone number [facility] assessment telephone number [facility] Assistance 911 [facility] and/or Residential Mobile Crisis Unit telephone number  Request made of family/significant other to: Remove weapons (e.g., guns, rifles, knives), all items previously/currently identified as safety concern.   Remove drugs/medications (over-the-counter, prescriptions, illicit drugs), all items previously/currently identified as a safety concern.  The family member/significant other verbalizes understanding of the suicide prevention education information provided.  The family member/significant other agrees to remove the items of safety concern listed above.  Jacinta Shoe, LCSW 09/21/2023, 5:47 PM

## 2023-09-21 NOTE — Group Note (Signed)
Recreation Therapy Group Note   Group Topic:Health and Wellness  Group Date: 09/21/2023 Start Time: 0930 End Time: 1000 Facilitators: Zoie Sarin-McCall, LRT,CTRS Location: [address] Dayroom   Group Topic: Wellness  Goal Area(s) Addresses:  Patient will define components of whole wellness. Patient will verbalize benefit of whole wellness.  Intervention: Music  Activity: Exercise. Patients took turns leading the group in the exercises of their choosing. The group completed three rounds of stretching and exercise. Patients were encouraged to get water, take breaks and not to over extend themselves during the course of the activity. Emphasis was placed on patients paying attention to their bodies and what exercises they are able to complete.   Education: Wellness, Building control surveyor.   Education Outcome: Acknowledges education/In group clarification offered/Needs additional education.   Affect/Mood: N/A   Participation Level: Did not attend    Clinical Observations/Individualized Feedback:      Plan: Continue to engage patient in RT group sessions 2-3x/week.   Kristan Votta-McCall, LRT,CTRS 09/21/2023 12:23 PM

## 2023-09-21 NOTE — Progress Notes (Signed)
   09/20/23 2115  Psych Admission Type (Psych Patients Only)  Admission Status Voluntary  Psychosocial Assessment  Patient Complaints Depression  Eye Contact Fair  Facial Expression Flat  Affect Depressed  Speech Logical/coherent  Interaction Assertive  Motor Activity Slow  Appearance/Hygiene In scrubs  Behavior Characteristics Cooperative  Mood Depressed;Anxious  Thought Process  Coherency WDL  Content WDL  Delusions None reported or observed  Perception Hallucinations  Hallucination Auditory  Judgment Impaired  Confusion None  Danger to Self  Current suicidal ideation? Denies  Agreement Not to Harm Self Yes  Description of Agreement verbal  Danger to Others  Danger to Others None reported or observed

## 2023-09-21 NOTE — Progress Notes (Signed)
Patient presents irritable at times but overall cooperative. Patient is med compliant and denies SI, HI, and VH. Patient endorses auditory hallucinations but states they have improved. Patient noted to have elevated BP throughout day and received clonidine po prn at 11:11am. Patients latest bp showing improvement. Patient remains mostly isolated to room and has not attended groups or recreation. Will continue to encourage patient's participation in milieu. No s/s of current distress.   BP 139/87 (BP Location: Left Arm)   Pulse (!) 104   Temp 97.8 F (36.6 C) (Oral)   Resp 18   Ht 6\' 1"  (1.854 m)   Wt (!) 147.4 kg   SpO2 98%   BMI 42.88 kg/m
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
## 2023-09-21 NOTE — Group Note (Signed)
Date:  09/21/2023 Time:  9:50 PM  Group Topic/Focus:  Wrap-Up Group:   The focus of this group is to help patients review their daily goal of treatment and discuss progress on daily workbooks.    Participation Level:  Active  Participation Quality:  Appropriate and Attentive  Affect:  Appropriate and Blunted  Cognitive:  Alert and Appropriate  Insight: Appropriate and Good  Engagement in Group:  Engaged  Modes of Intervention:  Discussion and Education  Additional Comments:  Pt attended and participated in wrap up group this evening and rated their day a 4/10. Pt stated that their meds that they have been on for 15 yrs "are not working". Pt goal is to have their medications managed and to work on their temper. Pt states that music is a great coping skills when they are angry. Pt complains of dizziness and chronic back pain.   Chrisandra Netters 09/21/2023, 9:50 PM

## 2023-09-21 NOTE — Group Note (Signed)
Date:  09/21/2023 Time:  11:09 AM  Group Topic/Focus:  Goals Group:   The focus of this group is to help patients establish daily goals to achieve during treatment and discuss how the patient can incorporate goal setting into their daily lives to aide in recovery. Orientation:   The focus of this group is to educate the patient on the purpose and policies of crisis stabilization and provide a format to answer questions about their admission.  The group details unit policies and expectations of patients while admitted.    Participation Level:  Did Not Attend  Participation Quality:   n/a  Affect:   n/a  Cognitive:   n/a  Insight: None  Engagement in Group:   n/a  Modes of Intervention:   n/a  Additional Comments:   Pt did not attend.  Brian [name] 09/21/2023, 11:09 AM

## 2023-09-21 NOTE — Group Note (Signed)
Date:  09/21/2023 Time:  4:21 PM  Group Topic/Focus:  Dimensions of Wellness:   The focus of this group is to introduce the topic of wellness and discuss the role each dimension of wellness plays in total health.    Participation Level:  Did Not Attend  Participation Quality:   n/a  Affect:   n/a  Cognitive:   n/a  Insight: None  Engagement in Group:   n/a  Modes of Intervention:   n/a  Additional Comments:   Pt did not attend.  Edmund Hilda Tate Zagal 09/21/2023, 4:21 PM

## 2023-09-21 NOTE — Plan of Care (Signed)
Problem: Activity: Goal: Interest or engagement in activities will improve Outcome: Progressing

## 2023-09-21 NOTE — Plan of Care (Signed)
Problem: Activity: Goal: Interest or engagement in activities will improve Outcome: Not Progressing Goal: Sleeping patterns will improve Outcome: Progressing   Problem: Health Behavior/Discharge Planning: Goal: Compliance with treatment plan for underlying cause of condition will improve Outcome: Progressing   Problem: Safety: Goal: Periods of time without injury will increase Outcome: Progressing

## 2023-09-21 NOTE — Plan of Care (Signed)
Problem: Education: Goal: Knowledge of [facility] Education information/materials will improve Outcome: Progressing Goal: Mental status will improve Outcome: Progressing Goal: Verbalization of understanding the information provided will improve Outcome: Progressing   Problem: Education: Goal: Emotional status will improve Outcome: Not Progressing   Problem: Activity: Goal: Interest or engagement in activities will improve Outcome: Not Progressing

## 2023-09-21 NOTE — BH IP Treatment Plan (Signed)
Interdisciplinary Treatment and Diagnostic Plan Update  09/21/2023 Time of Session: 1040 Brian [name] MRN: [id number]  Principal Diagnosis: Schizoaffective disorder, depressive type (HCC)  Secondary Diagnoses: Principal Problem:   Schizoaffective disorder, depressive type (HCC) Active Problems:   GAD (generalized anxiety disorder)   PTSD (post-traumatic stress disorder)   Tobacco use disorder   Current Medications:  Current Facility-Administered Medications  Medication Dose Route Frequency Provider Last Rate Last Admin   acetaminophen (TYLENOL) tablet 650 mg  650 mg Oral Q6H PRN Eligha Bridegroom, NP       albuterol (VENTOLIN HFA) 108 (90 Base) MCG/ACT inhaler 1-2 puff  1-2 puff Inhalation Q6H PRN Eligha Bridegroom, NP       alum & mag hydroxide-simeth (MAALOX/MYLANTA) 200-200-20 MG/5ML suspension 30 mL  30 mL Oral Q4H PRN Eligha Bridegroom, NP       amLODipine (NORVASC) tablet 10 mg  10 mg Oral Daily Eligha Bridegroom, NP   10 mg at 09/21/23 7564   atorvastatin (LIPITOR) tablet 20 mg  20 mg Oral QHS Eligha Bridegroom, NP   20 mg at 09/20/23 2115   benztropine (COGENTIN) tablet 0.5 mg  0.5 mg Oral BID Eligha Bridegroom, NP   0.5 mg at 09/21/23 3329   cloNIDine (CATAPRES) tablet 0.2 mg  0.2 mg Oral Q4H PRN Massengill, Harrold Donath, MD       cloNIDine (CATAPRES) tablet 0.2 mg  0.2 mg Oral Q12H Massengill, Nathan, MD       haloperidol (HALDOL) tablet 5 mg  5 mg Oral TID PRN Eligha Bridegroom, NP       And   diphenhydrAMINE (BENADRYL) capsule 50 mg  50 mg Oral TID PRN Eligha Bridegroom, NP       haloperidol lactate (HALDOL) injection 5 mg  5 mg Intramuscular TID PRN Eligha Bridegroom, NP       And   diphenhydrAMINE (BENADRYL) injection 50 mg  50 mg Intramuscular TID PRN Eligha Bridegroom, NP       haloperidol lactate (HALDOL) injection 10 mg  10 mg Intramuscular TID PRN Eligha Bridegroom, NP       And   diphenhydrAMINE (BENADRYL) injection 50 mg  50 mg Intramuscular TID PRN Eligha Bridegroom, NP        divalproex (DEPAKOTE ER) 24 hr tablet 1,500 mg  1,500 mg Oral QHS Massengill, Harrold Donath, MD       FLUoxetine (PROZAC) capsule 20 mg  20 mg Oral Daily Eligha Bridegroom, NP   20 mg at 09/21/23 5188   haloperidol (HALDOL) tablet 10 mg  10 mg Oral BID Eligha Bridegroom, NP   10 mg at 09/21/23 4166   hydrOXYzine (ATARAX) tablet 25 mg  25 mg Oral TID PRN Sarita Bottom, MD   25 mg at 09/20/23 2116   magnesium hydroxide (MILK OF MAGNESIA) suspension 30 mL  30 mL Oral Daily PRN Eligha Bridegroom, NP       metFORMIN (GLUCOPHAGE-XR) 24 hr tablet 500 mg  500 mg Oral BID WC Eligha Bridegroom, NP   500 mg at 09/21/23 0630   nicotine (NICODERM CQ - dosed in mg/24 hours) patch 14 mg  14 mg Transdermal Daily Massengill, Nathan, MD       nicotine polacrilex (NICORETTE) gum 2 mg  2 mg Oral PRN Abbott Pao, Nadir, MD       pantoprazole (PROTONIX) EC tablet 40 mg  40 mg Oral Daily Eligha Bridegroom, NP   40 mg at 09/21/23 0821   propranolol (INDERAL) tablet 10 mg  10 mg Oral Q12H Phineas Inches, MD  10 mg at 09/21/23 1412   ramelteon (ROZEREM) tablet 8 mg  8 mg Oral QHS Massengill, Harrold Donath, MD   8 mg at 09/20/23 2115   traZODone (DESYREL) tablet 50 mg  50 mg Oral QHS PRN Massengill, Harrold Donath, MD       PTA Medications: Medications Prior to Admission  Medication Sig Dispense Refill Last Dose/Taking   amLODipine (NORVASC) 10 MG tablet Take 1 tablet (10 mg total) by mouth daily. 90 tablet 1 Taking   atorvastatin (LIPITOR) 20 MG tablet Take 1 tablet (20 mg total) by mouth daily. (Patient taking differently: Take 20 mg by mouth at bedtime.) 90 tablet 1 Taking Differently   benztropine (COGENTIN) 0.5 MG tablet Take 1 Tablet By mouth 2 times per day 60 tablet 3 Taking   divalproex (DEPAKOTE ER) 500 MG 24 hr tablet TAKE 2 TABLET BY ORAL ROUTE PER AT BEDTIME 60 tablet 2 Taking   FLUoxetine (PROZAC) 20 MG capsule Take 1 Capsule By Oral Route 1 time per day 30 capsule 2 Taking   haloperidol (HALDOL) 10 MG tablet Take 1 Tablet By Oral  Route 3 times per day (Patient taking differently: Take 10 mg by mouth 2 (two) times daily.) 90 tablet 2 Taking Differently   hydrOXYzine (VISTARIL) 25 MG capsule Take 25 mg by mouth 3 (three) times daily as needed for anxiety.   Taking As Needed   metFORMIN (GLUCOPHAGE-XR) 500 MG 24 hr tablet Take 500 mg by mouth 2 (two) times daily with a meal.   Taking   omeprazole (PRILOSEC) 20 MG capsule Take 1 capsule (20 mg total) by mouth daily. (Patient taking differently: Take 20 mg by mouth every morning.) 90 capsule 3 Taking Differently   prazosin (MINIPRESS) 1 MG capsule TAKE 1 CAPSULE BY ORAL ROUTE PER AT BEDTIME 30 capsule 2 Taking   QUEtiapine (SEROQUEL) 25 MG tablet TAKE 1 TABLET BY MOUTH AT BEDTIME AS NEEDED FOR SLEEP. (Patient taking differently: Take 25 mg by mouth at bedtime.) 30 tablet 2 Taking Differently   ramelteon (ROZEREM) 8 MG tablet TAKE 1 TABLET BY ORAL ROUTE 1 TIME PER DAY AT BEDTIME (Patient taking differently: Take 8 mg by mouth at bedtime.) 30 tablet 2 Taking Differently   VENTOLIN HFA 108 (90 Base) MCG/ACT inhaler Inhale 1-2 puffs into the lungs every 6 (six) hours as needed for wheezing or shortness of breath. 18 g 3 Taking As Needed    Patient Stressors: Financial difficulties   Health problems   Occupational concerns    Patient Strengths: Capable of independent living  Supportive family/friends   Treatment Modalities: Medication Management, Group therapy, Case management,  1 to 1 session with clinician, Psychoeducation, Recreational therapy.   Physician Treatment Plan for Primary Diagnosis: Schizoaffective disorder, depressive type (HCC) Long Term Goal(s): Improvement in symptoms so as ready for discharge   Short Term Goals: Ability to identify changes in lifestyle to reduce recurrence of condition will improve Ability to verbalize feelings will improve Ability to disclose and discuss suicidal ideas Ability to demonstrate self-control will improve Ability to identify  and develop effective coping behaviors will improve Ability to maintain clinical measurements within normal limits will improve Compliance with prescribed medications will improve Ability to identify triggers associated with substance abuse/mental health issues will improve  Medication Management: Evaluate patient's response, side effects, and tolerance of medication regimen.  Therapeutic Interventions: 1 to 1 sessions, Unit Group sessions and Medication administration.  Evaluation of Outcomes: Not Progressing  Physician Treatment Plan for Secondary Diagnosis: Principal Problem:  Schizoaffective disorder, depressive type (HCC) Active Problems:   GAD (generalized anxiety disorder)   PTSD (post-traumatic stress disorder)   Tobacco use disorder  Long Term Goal(s): Improvement in symptoms so as ready for discharge   Short Term Goals: Ability to identify changes in lifestyle to reduce recurrence of condition will improve Ability to verbalize feelings will improve Ability to disclose and discuss suicidal ideas Ability to demonstrate self-control will improve Ability to identify and develop effective coping behaviors will improve Ability to maintain clinical measurements within normal limits will improve Compliance with prescribed medications will improve Ability to identify triggers associated with substance abuse/mental health issues will improve     Medication Management: Evaluate patient's response, side effects, and tolerance of medication regimen.  Therapeutic Interventions: 1 to 1 sessions, Unit Group sessions and Medication administration.  Evaluation of Outcomes: Not Progressing   RN Treatment Plan for Primary Diagnosis: Schizoaffective disorder, depressive type (HCC) Long Term Goal(s): Knowledge of disease and therapeutic regimen to maintain health will improve  Short Term Goals: Ability to remain free from injury will improve, Ability to verbalize frustration and anger  appropriately will improve, Ability to demonstrate self-control, Ability to participate in decision making will improve, Ability to verbalize feelings will improve, Ability to disclose and discuss suicidal ideas, Ability to identify and develop effective coping behaviors will improve, and Compliance with prescribed medications will improve  Medication Management: RN will administer medications as ordered by provider, will assess and evaluate patient's response and provide education to patient for prescribed medication. RN will report any adverse and/or side effects to prescribing provider.  Therapeutic Interventions: 1 on 1 counseling sessions, Psychoeducation, Medication administration, Evaluate responses to treatment, Monitor vital signs and CBGs as ordered, Perform/monitor CIWA, COWS, AIMS and Fall Risk screenings as ordered, Perform wound care treatments as ordered.  Evaluation of Outcomes: Not Progressing   LCSW Treatment Plan for Primary Diagnosis: Schizoaffective disorder, depressive type (HCC) Long Term Goal(s): Safe transition to appropriate next level of care at discharge, Engage patient in therapeutic group addressing interpersonal concerns.  Short Term Goals: Engage patient in aftercare planning with referrals and resources, Increase ability to appropriately verbalize feelings, Increase emotional regulation, Identify triggers associated with mental health/substance abuse issues, and Increase skills for wellness and recovery  Therapeutic Interventions: Assess for all discharge needs, 1 to 1 time with Social worker, Explore available resources and support systems, Assess for adequacy in community support network, Educate family and significant other(s) on suicide prevention, Complete Psychosocial Assessment, Interpersonal group therapy.  Evaluation of Outcomes: Not Progressing   Progress in Treatment: Attending groups: Yes. and As evidenced by:  per chart review Pt has attended approx.  50% of groups offered Participating in groups: Yes. Taking medication as prescribed: Yes. Toleration medication: Yes. Family/Significant other contact made: No, will contact:  mother, Christia Coaxum [phone] Patient understands diagnosis: Yes. Discussing patient identified problems/goals with staff: Yes. Medical problems stabilized or resolved: Yes. Denies suicidal/homicidal ideation: Yes. Issues/concerns per patient self-inventory: Yes. and As evidenced by:  discussed concerns regarding living arrangements post discharge Other: None  New problem(s) identified: No, Describe:  None  New Short Term/Long Term Goal(s): medication stabilization, elimination of SI thoughts, development of comprehensive mental wellness plan.   Patient Goals:  "Get my medicine fixed and stop ranting and raving and talking to myself"  Discharge Plan or Barriers: Patient recently admitted. CSW will continue to follow and assess for appropriate referrals and possible discharge planning.    Reason for Continuation of Hospitalization: Anxiety  Medication stabilization  Estimated Length of Stay: 5-7 Days  Last 3 [place] Suicide Severity Risk Score: Flowsheet Row [facility]Current[facility] Most recent reading at 09/19/2023  4:51 PM ED from 09/19/2023 in [facility] Most recent reading at 09/19/2023  7:27 AM Admission (Discharged) from 02/22/2023 in [facility] Most recent reading at 02/22/2023 10:33 AM  C-SSRS RISK CATEGORY Moderate Risk High Risk No Risk       Last PHQ 2/9 Scores:    01/24/2023   10:32 AM 08/06/2022   10:41 AM 02/03/2022   10:58 AM  Depression screen PHQ 2/9  Decreased Interest 1 1 1   Down, Depressed, Hopeless 1 2 1   PHQ - 2 Score 2 3 2   Altered sleeping 0 3 2  Tired, decreased energy 2 2 1   Change in appetite 1 1 0  Feeling bad or failure about yourself  0 1 0  Trouble concentrating 2 2 2    Moving slowly or fidgety/restless 0 1 0  Suicidal thoughts 0 0 0  PHQ-9 Score 7 13 7   Difficult doing work/chores   Somewhat difficult    Scribe for Treatment Team: Jacinta Shoe, LCSW 09/21/2023 3:03 PM

## 2023-09-21 NOTE — Progress Notes (Signed)
   09/21/23 0556  15 Minute Checks  Location Bedroom  Visual Appearance Calm  Behavior Sleeping  Sleep (Behavioral Health Patients Only)  Calculate sleep? (Click Yes once per 24 hr at 0600 safety check) Yes  Documented sleep last 24 hours 7.75

## 2023-09-22 DIAGNOSIS — F251 Schizoaffective disorder, depressive type: Secondary | ICD-10-CM | POA: Diagnosis not present

## 2023-09-22 MED ORDER — GUAIFENESIN 100 MG/5ML PO LIQD
5.0000 mL | ORAL | Status: DC | PRN
  Administered 2023-09-22 – 2023-09-23 (×2): 5 mL via ORAL
  Filled 2023-09-22 (×2): qty 10

## 2023-09-22 NOTE — Group Note (Signed)
LCSW Group Therapy Note   Group Date: 09/22/2023 Start Time: 1100 End Time: 1200   Type of Therapy:  Group Therapy  Topic:  Understanding Your Path to Change  Participation:  patient was present and actively participated in the discussion.   Objective:  The goal is to help participants understand the stages of change, identify where they currently are in the process, and provide actionable next steps to continue moving forward in their journey of change.  Goals: Learn about the six stages of change:  Precontemplation, Contemplation, Preparation, Action, Maintenance, and Relapse Reflect on Current Change Efforts:  Recognize which stage participants are in regarding to a personal change. Plan Next Steps for Moving Forward:  Create an action plan based on their current stage of change.  Class Summary: In this session, we explored the Stages of Change as a framework to understand the process of change.  We discussed how each stage helps individuals recognize where they are in their personal journey and used the Stages of Change Worksheet for self-reflection.  Participants answered questions to better understand their current stage, challenges, and progress. We also emphasized the importance of moving forward, even if setbacks (Relapse) occur, and created actionable steps to help participants continue progressing. By the end of the session, participants gained a clearer understanding of their path to change and left with a clear plan for next steps.  Therapeutic Modalities:  Elements of CBT (cognitive restructuring, problem solving)  Element of DBT (mindfulness, distress tolerance)   Muna Demers O Aniella Wandrey, LCSWA 09/22/2023  6:26 PM
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
## 2023-09-22 NOTE — Plan of Care (Signed)

## 2023-09-22 NOTE — Progress Notes (Signed)
   09/22/23 2000  Psych Admission Type (Psych Patients Only)  Admission Status Voluntary  Psychosocial Assessment  Patient Complaints Anxiety;Depression  Eye Contact Fair  Facial Expression Anxious;Flat  Affect Anxious;Depressed  Speech Logical/coherent  Interaction Assertive  Motor Activity Other (Comment) (WNL)  Appearance/Hygiene Unremarkable  Behavior Characteristics Appropriate to situation  Mood Anxious;Depressed  Thought Process  Coherency WDL  Content WDL  Delusions None reported or observed  Perception WDL  Hallucination Auditory  Judgment Impaired  Confusion None  Danger to Self  Current suicidal ideation? Denies  Agreement Not to Harm Self Yes  Description of Agreement Verbal Contract for Safety  Danger to Others  Danger to Others None reported or observed

## 2023-09-22 NOTE — Progress Notes (Signed)
Chaplain received a consult that [name] was requesting prayer.  Chaplain met with Brian [name].  He was somewhat guarded and did not want to talk, but did want chaplain to pray for his mom and his sister.  Chaplain provided prayer.  [address], [facility] Pager, [phone]

## 2023-09-22 NOTE — Plan of Care (Signed)
  Problem: Education: Goal: Knowledge of [facility] Education information/materials will improve Outcome: Progressing Goal: Mental status will improve Outcome: Progressing Goal: Verbalization of understanding the information provided will improve Outcome: Progressing   Problem: Activity: Goal: Interest or engagement in activities will improve Outcome: Progressing Goal: Sleeping patterns will improve Outcome: Progressing   Problem: Coping: Goal: Ability to verbalize frustrations and anger appropriately will improve Outcome: Progressing   Problem: Education: Goal: Emotional status will improve Outcome: Not Progressing

## 2023-09-22 NOTE — [facility] Group Notes (Signed)
Patient did not attend the Emotional Wellness group.

## 2023-09-22 NOTE — Progress Notes (Addendum)
Patient ID: [name], male   DOB: [birth year], 38 y.o.   MRN: [id number] Met with Pt on the unit. He appeared with pleasant affect and has been engaged throughout the milieu. Discussed discharge planning with Pt, specifically living arrangements. CSW informed Pt that she did speak with mother; he was also made aware as mother came to visit yesterday evening. Informed Brian [name] that after discussion with mother and members of his treatment team, it is in his best interest to return to his mother's home. He responded with "I'm 26 years old, why do I have to live with my mother." Spent brief moment with Pt re-framing and providing education on benefits of staying with mother to include remaining free from substances. Also reminded Pt of previous attempts to leave the home and live independently, however these were brief in nature and resulted in him returning to his mother's home.    CSW validated Pt's concerns about experiences with boredom while home and not having any healthy outlets. CSW inquired with Pt about whether he has participated in a PSR (Psychosocial Rehabilitation) type day program and he denies. CSW to investigate possible referrals for [facility] program. Will continue to follow.    [facility] [place], [place] 09/22/23  1601

## 2023-09-22 NOTE — Progress Notes (Signed)
   09/22/23 1100  Psych Admission Type (Psych Patients Only)  Admission Status Voluntary  Psychosocial Assessment  Patient Complaints Anxiety;Depression  Eye Contact Fair  Facial Expression Flat;Anxious  Affect Anxious;Depressed  Speech Logical/coherent  Interaction Assertive  Motor Activity Other (Comment) (WNL)  Appearance/Hygiene Unremarkable  Behavior Characteristics Cooperative  Mood Anxious;Depressed  Thought Process  Coherency WDL  Content WDL  Delusions None reported or observed  Perception Hallucinations  Hallucination Auditory  Judgment Impaired  Confusion None  Danger to Self  Current suicidal ideation? Denies  Agreement Not to Harm Self Yes  Description of Agreement verbal  Danger to Others  Danger to Others None reported or observed

## 2023-09-22 NOTE — Group Note (Signed)
Date:  09/22/2023 Time:  9:45 PM  Group Topic/Focus:  Wrap-Up Group:   The focus of this group is to help patients review their daily goal of treatment and discuss progress on daily workbooks.    Participation Level:  None  Participation Quality:   n/a  Affect:   n/a  Cognitive:   n/a  Insight: None  Engagement in Group:  None  Modes of Intervention:  Activity and Socialization  Additional Comments:  Patients used and completed wrap up group sheets. Patient left group shortly after the start of group, Patient wrote limited to nothing on wrap up group sheet.   Brian [name] 09/22/2023, 9:45 PM

## 2023-09-23 DIAGNOSIS — F251 Schizoaffective disorder, depressive type: Secondary | ICD-10-CM | POA: Diagnosis not present

## 2023-09-23 NOTE — Progress Notes (Signed)
   09/23/23 0900  Psych Admission Type (Psych Patients Only)  Admission Status Voluntary  Psychosocial Assessment  Patient Complaints None  Eye Contact Fair  Facial Expression Flat  Affect Appropriate to circumstance  Speech Logical/coherent  Interaction Assertive  Motor Activity Other (Comment) (WNL)  Appearance/Hygiene Unremarkable  Behavior Characteristics Appropriate to situation  Mood Pleasant  Thought Process  Coherency WDL  Content WDL  Delusions None reported or observed  Perception WDL  Hallucination None reported or observed  Judgment Impaired  Confusion None  Danger to Self  Current suicidal ideation? Denies  Agreement Not to Harm Self Yes  Description of Agreement verbal  Danger to Others  Danger to Others None reported or observed

## 2023-09-23 NOTE — Group Note (Signed)
Date:  09/23/2023 Time:  1:38 PM  Group Topic/Focus:  Goals Group:   The focus of this group is to help patients establish daily goals to achieve during treatment and discuss how the patient can incorporate goal setting into their daily lives to aide in recovery. Orientation:   The focus of this group is to educate the patient on the purpose and policies of crisis stabilization and provide a format to answer questions about their admission.  The group details unit policies and expectations of patients while admitted.    Participation Level:  Did Not Attend  Participation Quality:   n/a  Affect:   n/a  Cognitive:   n/a  Insight: None  Engagement in Group:   n/a  Modes of Intervention:   n/a  Additional Comments:   Pt did not attend.  Edmund Hilda Avin Upperman 09/23/2023, 1:38 PM
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
## 2023-09-23 NOTE — Plan of Care (Signed)
  Problem: Education: Goal: Emotional status will improve Outcome: Progressing Goal: Verbalization of understanding the information provided will improve Outcome: Progressing   Problem: Activity: Goal: Interest or engagement in activities will improve Outcome: Progressing   

## 2023-09-23 NOTE — [facility] Group Notes (Signed)
Spiritual care group facilitated by Chaplain Katy Denessa Cavan, [facility]  Group focused on topic of strength. Group members reflected on what thoughts and feelings emerge when they hear this topic. They then engaged in facilitated dialog around how strength is present in their lives. This dialog focused on representing what strength had been to them in their lives (images and patterns given) and what they saw as helpful in their life now (what they needed / wanted).  Activity drew on narrative framework.  Patient Progress: Did not attend.  

## 2023-09-23 NOTE — [facility] Group Notes (Signed)
[facility] Group Notes:  (Nursing/MHT/Case Management/Adjunct)  Date:  09/23/2023  Time:  8:25 PM  Type of Therapy:   AA Group  Participation Level:  Did Not Attend  Participation Quality:    Affect:    Cognitive:    Insight:    Engagement in Group:    Modes of Intervention:    Summary of Progress/Problems: Pt refused to attend group.  Noah Delaine 09/23/2023, 8:25 PM

## 2023-09-23 NOTE — Group Note (Signed)
Recreation Therapy Group Note   Group Topic:Problem Solving  Group Date: 09/23/2023 Start Time: 0930 End Time: 1015 Facilitators: Sabryn Preslar-McCall, LRT,CTRS Location: [address] Dayroom   Group Topic: Problem Solving  Goal Area(s) Addresses:  Patient will effectively work in a team with other group members. Patient will verbalize importance of using appropriate problem solving techniques.  Patient will identify positive change associated with effective problem solving skills.   Intervention: Worksheets, Music  Activity: Patients were given two sheets of brain teasers. Patients were to work through each puzzle to come up with the answer. Patients had the option of working together or individually.    Education: Problem solving, Use of skills post discharge  Education Outcome: Acknowledges understanding/In group clarification offered/Needs additional education.    Affect/Mood: N/A   Participation Level: Did not attend    Clinical Observations/Individualized Feedback:     Plan: Continue to engage patient in RT group sessions 2-3x/week.   Deloras Reichard-McCall, LRT,CTRS 09/23/2023 11:32 AM

## 2023-09-23 NOTE — [facility] Suicide Risk Assessment (Signed)
[facility] INPATIENT:  Family/Significant Other Suicide Prevention Education  Suicide Prevention Education:  Education Completed; mother, Braylynn Ghan [phone],  (name of family member/significant other) has been identified by the patient as the family member/significant other with whom the patient will be residing, and identified as the person(s) who will aid the patient in the event of a mental health crisis (suicidal ideations/suicide attempt).  With written consent from the patient, the family member/significant other has been provided the following suicide prevention education, prior to the and/or following the discharge of the patient.  Safety planning was completed by Mckinley Jewel, LCSW on 1/29. Spoke with mom today to report that patient would like to discharge home per conversation with pt and MD this morning. Mom was relieved and is happy pt will come back and stay with her. Mother is aware pt is expected to discharge tomorrow, 2/1 and she will be here between 11:30-12PM to pick him up.  Kathi Der 09/23/2023, 1:12 PM

## 2023-09-24 DIAGNOSIS — F251 Schizoaffective disorder, depressive type: Secondary | ICD-10-CM | POA: Diagnosis not present

## 2023-09-24 LAB — VALPROIC ACID LEVEL: Valproic Acid Lvl: 83 ug/mL (ref 50.0–100.0)

## 2023-09-24 MED ORDER — PROPRANOLOL HCL 10 MG PO TABS: 10.0000 mg | ORAL_TABLET | Freq: Two times a day (BID) | ORAL | 0 refills | Status: AC

## 2023-09-24 MED ORDER — CLONIDINE HCL 0.1 MG PO TABS: 0.1000 mg | ORAL_TABLET | Freq: Two times a day (BID) | ORAL | 0 refills | Status: AC

## 2023-09-24 MED ORDER — NICOTINE 14 MG/24HR TD PT24: 14.0000 mg | MEDICATED_PATCH | Freq: Every day | TRANSDERMAL | 0 refills | Status: AC

## 2023-09-24 MED ORDER — DIVALPROEX SODIUM ER 500 MG PO TB24: 1500.0000 mg | ORAL_TABLET | Freq: Every day | ORAL | 0 refills | Status: AC

## 2023-09-24 MED ORDER — FLUOXETINE HCL 20 MG PO CAPS: ORAL_CAPSULE | ORAL | 0 refills | Status: AC

## 2023-09-24 MED ORDER — BENZTROPINE MESYLATE 0.5 MG PO TABS: 0.5000 mg | ORAL_TABLET | Freq: Two times a day (BID) | ORAL | 0 refills | Status: AC

## 2023-09-24 MED ORDER — HYDROXYZINE PAMOATE 25 MG PO CAPS: 25.0000 mg | ORAL_CAPSULE | Freq: Three times a day (TID) | ORAL | 0 refills | Status: AC | PRN

## 2023-09-24 MED ORDER — TRAZODONE HCL 50 MG PO TABS: 50.0000 mg | ORAL_TABLET | Freq: Every evening | ORAL | 0 refills | Status: AC | PRN

## 2023-09-24 MED ORDER — RAMELTEON 8 MG PO TABS: 8.0000 mg | ORAL_TABLET | Freq: Every day | ORAL | 0 refills | Status: AC

## 2023-09-24 MED ORDER — HALOPERIDOL 10 MG PO TABS: 10.0000 mg | ORAL_TABLET | Freq: Two times a day (BID) | ORAL | 0 refills | Status: AC

## 2023-09-24 MED ORDER — GUAIFENESIN 100 MG/5ML PO LIQD: 5.0000 mL | ORAL | Status: AC | PRN

## 2023-09-24 MED ORDER — NICOTINE POLACRILEX 2 MG MT GUM: 2.0000 mg | CHEWING_GUM | OROMUCOSAL | 0 refills | Status: AC | PRN

## 2023-09-24 NOTE — [facility] Group Notes (Deleted)
[facility] Group Notes:  (Nursing/MHT/Case Management/Adjunct)  Date:  09/24/2023  Time:  9:31 PM  Type of Therapy:   Wrap-up group  Participation Level:  Did Not Attend  Participation Quality:    Affect:    Cognitive:    Insight:    Engagement in Group:    Modes of Intervention:    Summary of Progress/Problems: Pt refused to attend group. Writer provided Pt with list of coping skills.  Noah Delaine 09/24/2023, 9:31 PM

## 2023-09-24 NOTE — [facility] Suicide Risk Assessment (Incomplete Revision)
[facility] Discharge Suicide Risk Assessment   Principal Problem: Schizoaffective disorder, depressive type [facility]) Discharge Diagnoses: Principal Problem:   Schizoaffective disorder, depressive type (HCC) Active Problems:   GAD (generalized anxiety disorder)   PTSD (post-traumatic stress disorder)   Tobacco use disorder   Total Time spent with patient: 20 minutes  Patient is a 38 year old male who reports that he has a psychiatric history of schizoaffective disorder bipolar type and PTSD, who was admitted to the psychiatric hospital for worsening suicidal thoughts, homicidal thoughts, and command auditory hallucinations to harm himself and others. He reports taking his medications as prescribed leading up to this admission.     Psychiatric Specialty Exam  Presentation  General Appearance:  Appropriate for Environment; Casual; Fairly Groomed  Eye Contact: Good  Speech: Normal Rate  Speech Volume: Normal  Handedness:No data recorded  Mood and Affect  Mood: Euthymic  Affect: Appropriate; Congruent; Full Range   Thought Process  Thought Processes: Linear  Descriptions of Associations:Intact  Orientation:Full (Time, Place and Person)  Thought Content:Logical   Hallucinations:Hallucinations: None  Ideas of Reference:None  Suicidal Thoughts:Suicidal Thoughts: No  Homicidal Thoughts:Homicidal Thoughts: No   Sensorium  Memory: Immediate Good; Recent Good; Remote Good  Judgment: Fair  Insight: Fair   Chartered certified accountant: Fair  Attention Span: Fair  Recall: Good  Fund of Knowledge: Good  Language: Good   Psychomotor Activity  Psychomotor Activity: Psychomotor Activity: Normal   Sleep  Sleep: Sleep: Fair   Physical Exam: Physical Exam See discharge summary  ROS See discharge summary   Blood pressure 108/67, pulse 67, temperature 98 F (36.7 C), temperature source Oral, resp. rate 16, height 6\' 1"  (1.854 m), weight  (!) 147.4 kg, SpO2 98%. Body mass index is 42.88 kg/m.  Mental Status Per Nursing Assessment::   On Admission:  Suicidal ideation indicated by patient  Demographic factors:  Caucasian, Low socioeconomic status, Unemployed Loss Factors:  Financial problems / change in socioeconomic status Historical Factors:  Prior suicide attempts Risk Reduction Factors:  Positive social support  Continued Clinical Symptoms:  Mood is stable. Anxiety at a manageable level. Denying any SI including passive SI.    Cognitive Features That Contribute To Risk:  None    Suicide Risk:  Mild:  There are no identifiable suicide plans, no associated intent, mild dysphoria and related symptoms, good self-control (both objective and subjective assessment), few other risk factors, and identifiable protective factors, including available and accessible social support.    Follow-up Information     [facility] Follow up.   Specialty: Behavioral Health Why: You may go to this provider for therapy and medication management services. Please go on Monday through Friday, arrive by 7:00 am for same day service. Contact information: [address] [place] [address] [phone]        [facility] Go to.   Specialty: Professional Counselor Why: You may go to this provider for therapy services, on Monday through Friday, from 9 am to 1 pm to receive an intake assessment. Contact information: [address] [place] [address] [phone]         [facility]. Go on 10/04/2023.   Why: You have a hospital follow up appointment for therapy and medication management services scheduled for 10/04/23 at 1:45 pm onsite, in person. Contact information: [address]  [address] [place] [address] [phone]         [facility]. Go to.   Why: A  referral has been made to this provider for the [facility] (psychosocial  rehabilitation) program.  Please call to schedule an appointment. Contact information: [address] [place] [address] [phone]                 Plan Of Care/Follow-up recommendations:   -Follow-up with your outpatient psychiatric provider -instructions on appointment date, time, and address (location) are provided to you in discharge paperwork.   -Take your psychiatric medications as prescribed at discharge - instructions are provided to you in the discharge paperwork   -Follow-up with outpatient primary care doctor and other specialists -for management of preventative medicine and chronic medical disease   Valproic acid level on 09-24-23: 83    -If you are prescribed an atypical antipsychotic medication, we recommend that your outpatient psychiatrist follow routine screening for side effects within 3 months of discharge, including monitoring: AIMS scale, height, weight, blood pressure, fasting lipid panel, HbA1c, and fasting blood sugar.    -Recommend total abstinence from alcohol, tobacco, and other illicit drug use at discharge.    -If your psychiatric symptoms recur, worsen, or if you have side effects to your psychiatric medications, call your outpatient psychiatric provider, 911, [phone] or go to the nearest emergency department.   -If suicidal thoughts occur, immediately call your outpatient psychiatric provider, 911, [phone] or go to the nearest emergency department.    Phineas Inches, MD 09/24/2023, 7:15 AM
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
## 2023-09-24 NOTE — Plan of Care (Signed)
  Problem: Education: Goal: Mental status will improve Outcome: Progressing   

## 2023-09-24 NOTE — Group Note (Signed)
Date:  09/24/2023 Time:  9:43 AM  Group Topic/Focus:  Goals Group:   The focus of this group is to help patients establish daily goals to achieve during treatment and discuss how the patient can incorporate goal setting into their daily lives to aide in recovery. Orientation:   The focus of this group is to educate the patient on the purpose and policies of crisis stabilization and provide a format to answer questions about their admission.  The group details unit policies and expectations of patients while admitted.    Participation Level:  Did Not Attend  [facility] 09/24/2023, 9:43 AM

## 2023-09-24 NOTE — Discharge Instructions (Addendum)
-  Follow-up with your outpatient psychiatric provider -instructions on appointment date, time, and address (location) are provided to you in discharge paperwork.  -Take your psychiatric medications as prescribed at discharge - instructions are provided to you in the discharge paperwork As you requested, we will only your psychiatric prescriptions to your pharmacy.   Your depakote level on day of dc 09-24-23: 83   -Follow-up with outpatient primary care doctor and other specialists -for management of preventative medicine and any chronic medical disease.  -Recommend abstinence from alcohol, tobacco, and other illicit drug use at discharge.   -If your psychiatric symptoms recur, worsen, or if you have side effects to your psychiatric medications, call your outpatient psychiatric provider, 911, [phone] or go to the nearest emergency department.  -If suicidal thoughts occur, call your outpatient psychiatric provider, 911, [phone] or go to the nearest emergency department.  Naloxone (Narcan) can help reverse an overdose when given to the victim quickly.  [facility] offers free naloxone kits and instructions/training on its use.  Add naloxone to your first aid kit and you can help save a life.   Pick up your free kit at the following locations:   [place]:  [facility], [address] [place] [address] [phone]) [facility] [address] [place] [address] [phone]) [facility] [address] [place] [address]  [place]: [facility] of [facility] [address] [place] [address] ([phone]) [facility] [address] [place] [address] [phone])
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
## 2023-09-24 NOTE — [facility] Suicide Risk Assessment (Signed)
[facility] Discharge Suicide Risk Assessment   Principal Problem: Schizoaffective disorder, depressive type [facility]) Discharge Diagnoses: Principal Problem:   Schizoaffective disorder, depressive type (HCC) Active Problems:   GAD (generalized anxiety disorder)   PTSD (post-traumatic stress disorder)   Tobacco use disorder   Total Time spent with patient: 20 minutes  Patient is a 38 year old male who reports that he has a psychiatric history of schizoaffective disorder bipolar type and PTSD, who was admitted to the psychiatric hospital for worsening suicidal thoughts, homicidal thoughts, and command auditory hallucinations to harm himself and others. He reports taking his medications as prescribed leading up to this admission.     Psychiatric Specialty Exam  Presentation  General Appearance:  Appropriate for Environment; Casual; Fairly Groomed  Eye Contact: Good  Speech: Normal Rate; Clear and Coherent  Speech Volume: Normal  Handedness:No data recorded  Mood and Affect  Mood: Euthymic  Affect: Appropriate; Congruent; Full Range   Thought Process  Thought Processes: Linear  Descriptions of Associations:Intact  Orientation:Full (Time, Place and Person)  Thought Content:Logical   Hallucinations:Hallucinations: None  Ideas of Reference:None  Suicidal Thoughts:Suicidal Thoughts: No  Homicidal Thoughts:Homicidal Thoughts: No   Sensorium  Memory: Immediate Good; Recent Good; Remote Good  Judgment: Fair  Insight: Fair   Chartered certified accountant: Fair  Attention Span: Fair  Recall: Good  Fund of Knowledge: Good  Language: Good   Psychomotor Activity  Psychomotor Activity: Psychomotor Activity: Normal   Sleep  Sleep: Sleep: Fair   Physical Exam: Physical Exam See discharge summary  ROS See discharge summary   Blood pressure 109/69, pulse 63, temperature 98.1 F (36.7 C), temperature source Oral, resp. rate 18, height 6'  1" (1.854 m), weight (!) 147.4 kg, SpO2 97%. Body mass index is 42.88 kg/m.  Mental Status Per Nursing Assessment::   On Admission:  Suicidal ideation indicated by patient  Demographic factors:  Caucasian, Low socioeconomic status, Unemployed Loss Factors:  Financial problems / change in socioeconomic status Historical Factors:  Prior suicide attempts Risk Reduction Factors:  Positive social support  Continued Clinical Symptoms:  Mood is stable. Anxiety at a manageable level. Denying any SI including passive SI.    Cognitive Features That Contribute To Risk:  None    Suicide Risk:  Mild:  There are no identifiable suicide plans, no associated intent, mild dysphoria and related symptoms, good self-control (both objective and subjective assessment), few other risk factors, and identifiable protective factors, including available and accessible social support.    Follow-up Information     [facility] Follow up.   Specialty: Behavioral Health Why: You may go to this provider for therapy and medication management services. Please go on Monday through Friday, arrive by 7:00 am for same day service. Contact information: [address] [place] [address] [phone]        [place] Go to.   Specialty: Professional Counselor Why: You may go to this provider for therapy services, on Monday through Friday, from 9 am to 1 pm to receive an intake assessment. Contact information: [address] [place] [address] [phone]         [facility]. Go on 10/04/2023.   Why: You have a hospital follow up appointment for therapy and medication management services scheduled for 10/04/23 at 1:45 pm onsite, in person. Contact information: [address]  [address] [place] [address] [phone]         [facility]. Go to.  Why: A referral has been made to this provider for the [facility]  (psychosocial rehabilitation) program.  Please call to schedule an appointment. Contact information: [address] [place] [address] [phone]                 Plan Of Care/Follow-up recommendations:   -Follow-up with your outpatient psychiatric provider -instructions on appointment date, time, and address (location) are provided to you in discharge paperwork.   -Take your psychiatric medications as prescribed at discharge - instructions are provided to you in the discharge paperwork   -Follow-up with outpatient primary care doctor and other specialists -for management of preventative medicine and chronic medical disease   Valproic acid level on 09-24-23: 83    -If you are prescribed an atypical antipsychotic medication, we recommend that your outpatient psychiatrist follow routine screening for side effects within 3 months of discharge, including monitoring: AIMS scale, height, weight, blood pressure, fasting lipid panel, HbA1c, and fasting blood sugar.    -Recommend total abstinence from alcohol, tobacco, and other illicit drug use at discharge.    -If your psychiatric symptoms recur, worsen, or if you have side effects to your psychiatric medications, call your outpatient psychiatric provider, 911, [phone] or go to the nearest emergency department.   -If suicidal thoughts occur, immediately call your outpatient psychiatric provider, 911, [phone] or go to the nearest emergency department.   This SRA was reviewed and any updates were made for the discharge on 09-26-23.   Phineas Inches, MD 09/26/2023, 9:05 AM

## 2023-09-24 NOTE — Progress Notes (Signed)
 D: Pt alert and oriented. Pt rates depression 0/10 and anxiety 4/10.  Pt denies experiencing any SI/HI, or AVH at this time.   A: Scheduled medications administered to pt, per MD orders. Support and encouragement provided. Frequent verbal contact made. Routine safety checks conducted q15 minutes.   R: No adverse drug reactions noted. Pt verbally contracts for safety at this time. Pt complaint with medications and treatment plan. Pt interacts well with others on the unit. Pt remains safe at this time. Will continue to monitor.

## 2023-09-24 NOTE — [facility] Group Notes (Signed)
[facility] Group Notes:  (Nursing)  Date:  09/24/2023  Time:  1400  Type of Therapy:  Psychoeducational Skills  Participation Level:  Minimal  Participation Quality:  Appropriate and Sharing  Affect:  Appropriate  Cognitive:  Lacking  Insight:  Improving  Engagement in Group:  Lacking  Modes of Intervention:  Activity, Discussion, Exploration, Problem-solving, Rapport Building, Socialization, and Support  Summary of Progress/Problems:  Brian [name] 09/24/2023, 4:10 PM

## 2023-09-24 NOTE — Progress Notes (Signed)
  [facility] Adult Case Management Discharge Plan :  Will you be returning to the same living situation after discharge:  Yes,  going to his mom's house At discharge, do you have transportation home?: Yes,  mom will be picking him up Do you have the ability to pay for your medications: Yes,  Patient has insurance  Release of information consent forms completed and in the chart;  Patient's signature needed at discharge.  Patient to Follow up at:  Follow-up Information     [facility] Follow up.   Specialty: Behavioral Health Why: You may go to this provider for therapy and medication management services. Please go on Monday through Friday, arrive by 7:00 am for same day service. Contact information: [address] [place] [address] [phone]        [place] Go to.   Specialty: Professional Counselor Why: You may go to this provider for therapy services, on Monday through Friday, from 9 am to 1 pm to receive an intake assessment. Contact information: [address] [place] [address] [phone]         [facility]. Go on 10/04/2023.   Why: You have a hospital follow up appointment for therapy and medication management services scheduled for 10/04/23 at 1:45 pm onsite, in person. Contact information: [address]  [address] [place] [address] [phone]         [facility]. Go to.   Why: A referral has been made to this provider for the [facility] (psychosocial rehabilitation) program.  Please call to schedule an appointment. Contact information: [address] [place] [address] [phone]                 Next level of care provider has access to [facility] Link:no  Safety Planning and Suicide Prevention discussed: Yes,  CSW talk to mom     Has patient been referred to the Quitline?: Patient refused referral for treatment  Patient has been referred  for addiction treatment: Patient refused referral for treatment; referral information given to patient at discharge.  Lakaya Tolen O Jizel Cheeks, LCSWA 09/24/2023, 9:39 AM
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
## 2023-09-24 NOTE — Progress Notes (Signed)
   09/24/23 0900  Psych Admission Type (Psych Patients Only)  Admission Status Voluntary  Psychosocial Assessment  Patient Complaints None  Eye Contact Fair  Facial Expression Flat  Affect Flat  Speech Logical/coherent  Interaction Assertive  Motor Activity Other (Comment) (level 3 observation)  Appearance/Hygiene Unremarkable  Behavior Characteristics Cooperative;Appropriate to situation  Mood Anxious  Thought Process  Coherency WDL  Content WDL  Delusions None reported or observed  Perception WDL  Hallucination None reported or observed  Judgment Impaired  Confusion None  Danger to Self  Current suicidal ideation? Denies  Danger to Others  Danger to Others None reported or observed

## 2023-09-24 NOTE — [facility] Group Notes (Signed)
[facility] Group Notes:  (Nursing/MHT/Case Management/Adjunct)  Date:  09/24/2023  Time:  10:57 PM  Type of Therapy:   Wrap-up group  Participation Level:  Active  Participation Quality:  Appropriate  Affect:  Appropriate  Cognitive:  Appropriate  Insight:  Appropriate  Engagement in Group:  Engaged  Modes of Intervention:  Education  Summary of Progress/Problems: Pt attended group. Pt had to leave group before reviewing goal.   Noah Delaine 09/24/2023, 10:57 PM

## 2023-09-24 NOTE — Group Note (Unsigned)
Date:  09/26/2023 Time:  7:37 AM  Group Topic/Focus:  Emotional Education:   The focus of this group is to discuss what feelings/emotions are, and how they are experienced.    Participation Level:  Active  Brian [name] Swim 09/26/2023, 7:37 AM

## 2023-09-25 DIAGNOSIS — F251 Schizoaffective disorder, depressive type: Secondary | ICD-10-CM | POA: Diagnosis not present

## 2023-09-25 MED ORDER — ACETAMINOPHEN 325 MG PO TABS
650.0000 mg | ORAL_TABLET | Freq: Four times a day (QID) | ORAL | Status: DC | PRN
  Administered 2023-09-25: 650 mg via ORAL

## 2023-09-25 NOTE — Progress Notes (Signed)
This pt accidentally spilled coffee on him while he was sitting in the dayroom. Pt said that the coffee was cold and not hot. He spilled it over his left leg, but he had pants on at that time. This Clinical research associate assessed his left leg and there is no redness or blisters. Pt also denies any burning, discomfort, or pain. Notified Ene A,, NP at 2030 and there are no new orders. Pt denies SI/HI and AVH. Active listening, reassurance, and support provided. Every 15 minutes safety checks continue. Pt's safety has been maintained.   09/24/23 2030  Psych Admission Type (Psych Patients Only)  Admission Status Voluntary  Psychosocial Assessment  Patient Complaints Anxiety;Depression;Insomnia;Other (Comment) (Anxiety and depression rated a 5 on a scale of 0-10 (10 being the worst). Upset that he hasn't been discharged yet.)  Eye Contact Fair  Facial Expression Flat  Affect Appropriate to circumstance;Depressed;Anxious  Speech Logical/coherent  Interaction Assertive  Motor Activity Other (Comment) (WDL)  Appearance/Hygiene Unremarkable  Behavior Characteristics Cooperative;Appropriate to situation  Mood Depressed;Pleasant;Anxious  Thought Process  Coherency WDL  Content WDL  Delusions None reported or observed  Perception WDL  Hallucination None reported or observed  Judgment Impaired  Confusion None  Danger to Self  Current suicidal ideation? Denies  Agreement Not to Harm Self Yes  Description of Agreement verbally contracts for safety  Danger to Others  Danger to Others None reported or observed

## 2023-09-25 NOTE — Group Note (Signed)
[facility] LCSW Group Therapy Note    Group Date: 09/25/2023 Start Time: 1000 End Time: 1120  Type of Therapy and Topic:  Group Therapy:  Overcoming Obstacles  Participation Level:  [facility] PARTICIPATION LEVEL: None  Mood:  Description of Group:   In this group patients will be encouraged to explore what they see as obstacles to their own wellness and recovery. They will be guided to discuss their thoughts, feelings, and behaviors related to these obstacles. The group will process together ways to cope with barriers, with attention given to specific choices patients can make. Each patient will be challenged to identify changes they are motivated to make in order to overcome their obstacles. This group will be process-oriented, with patients participating in exploration of their own experiences as well as giving and receiving support and challenge from other group members.  Therapeutic Goals: 1. Patient will identify personal and current obstacles as they relate to admission. 2. Patient will identify barriers that currently interfere with their wellness or overcoming obstacles.  3. Patient will identify feelings, thought process and behaviors related to these barriers. 4. Patient will identify two changes they are willing to make to overcome these obstacles:    Summary of Patient Progress Patient was invited to group, patient arrived later in group. Patient was very respectful but did not participate.    Therapeutic Modalities:   Cognitive Behavioral Therapy Solution Focused Therapy Motivational Interviewing Relapse Prevention Therapy   Georgie Chard, LCSW

## 2023-09-25 NOTE — [facility] Group Notes (Signed)
Pt did not attend goals group. 

## 2023-09-25 NOTE — Group Note (Signed)
Date:  09/25/2023 Time:  9:10 PM  Group Topic/Focus:  Wrap-Up Group:   The focus of this group is to help patients review their daily goal of treatment and discuss progress on daily workbooks.    Participation Level:  Did Not Attend  Participation Quality:  Resistant  Affect:  Resistant  Cognitive:  Lacking  Insight: Lacking  Engagement in Group:  Lacking  Modes of Intervention:  Discussion  Additional Comments:  Patient did not attend wrap up group this evening   Alfonse Ras 09/25/2023, 9:10 PM
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
## 2023-09-25 NOTE — Plan of Care (Signed)
   Problem: Education: Goal: Verbalization of understanding the information provided will improve Outcome: Progressing   Problem: Activity: Goal: Interest or engagement in activities will improve Outcome: Progressing

## 2023-09-25 NOTE — Progress Notes (Signed)
   09/25/23 0540  15 Minute Checks  Location Bedroom  Visual Appearance Calm  Behavior Sleeping  Sleep (Behavioral Health Patients Only)  Calculate sleep? (Click Yes once per 24 hr at 0600 safety check) Yes  Documented sleep last 24 hours 8.25
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
## 2023-09-25 NOTE — [facility] Group Notes (Signed)
[facility] Group Notes:  (Nursing)  Date:  09/25/2023  Time:  1400  Type of Therapy:  Music Therapy  Participation Level:  Did Not Attend   Shela Nevin 09/25/2023, 4:06 PM

## 2023-09-26 DIAGNOSIS — F251 Schizoaffective disorder, depressive type: Secondary | ICD-10-CM | POA: Diagnosis not present

## 2023-09-26 NOTE — Plan of Care (Signed)
   Problem: Safety: Goal: Periods of time without injury will increase Outcome: Progressing

## 2023-09-26 NOTE — Plan of Care (Signed)
 Nurse discussed coping skills with patient.

## 2023-09-26 NOTE — Progress Notes (Signed)
  [name] Adult Case Management Discharge Plan :  Will you be returning to the same living situation after discharge:  Yes,  home with mother Brayton Baumgartner [phone] At discharge, do you have transportation home?: Yes,  Eustacio Ellen, mother will transport Do you have the ability to pay for your medications: Yes,  has insurance  Release of information consent forms completed and in the chart;  Patient's signature needed at discharge.  Patient to Follow up at:  Follow-up Information     [facility] Follow up.   Specialty: Behavioral Health Why: You may go to this provider for therapy and medication management services. Please go on Monday through Friday, arrive by 7:00 am for same day service. Contact information: [address] [place] [address] [phone]        [facility] Go to.   Specialty: Professional Counselor Why: You may go to this provider for therapy services, on Monday through Friday, from 9 am to 1 pm to receive an intake assessment. Contact information: [address] [place] [address] [phone]         [facility]. Go on 10/04/2023.   Why: You have a hospital follow up appointment for therapy and medication management services scheduled for 10/04/23 at 1:45 pm onsite, in person. Contact information: [address]  [address] [place] [address] [phone]         [facility]. Go to.   Why: A referral has been made to this provider for the [facility] (psychosocial rehabilitation) program.  Please call to schedule an appointment. Contact information: [address] [place] [address] [phone]                 Next level of care provider has access to [facility] Link:no  Safety Planning and Suicide Prevention discussed: Yes,  Tanya Marvin, mother  [phone]     Has patient been referred to the Quitline?: Patient refused referral for  treatment  Patient has been referred for addiction treatment: Patient refused referral for treatment.  Marinda Elk, LCSW 09/26/2023, 11:03 AM

## 2023-09-26 NOTE — Progress Notes (Signed)
Pt discharged home. All belongings returned to pt. Discharge instructions reviewed with pt and pt verbalized understanding of discharge instructions.

## 2023-09-26 NOTE — Plan of Care (Addendum)
D:  Patient denied SI and HI, contracts for safety.  Denied A/V hallucinations.   A:  Medications administered per MD orders.  Emotional support and encouragement given patient. R:  Safety maintained with 15 minute checks.  

## 2023-09-26 NOTE — Progress Notes (Signed)
   09/26/23 1191  15 Minute Checks  Location Bedroom  Visual Appearance Calm  Behavior Sleeping  Sleep (Behavioral Health Patients Only)  Calculate sleep? (Click Yes once per 24 hr at 0600 safety check) Yes  Documented sleep last 24 hours 8

## 2023-09-26 NOTE — Progress Notes (Signed)
   09/25/23 2115  Psych Admission Type (Psych Patients Only)  Admission Status Voluntary  Psychosocial Assessment  Patient Complaints None  Eye Contact Fair  Facial Expression Flat  Affect Flat  Speech Logical/coherent  Interaction Assertive  Motor Activity Other (Comment) (WDL)  Appearance/Hygiene Unremarkable  Behavior Characteristics Cooperative  Mood Preoccupied  Thought Process  Coherency WDL  Content WDL  Delusions None reported or observed  Perception WDL  Hallucination None reported or observed  Judgment Impaired  Confusion None  Danger to Self  Current suicidal ideation? Denies  Agreement Not to Harm Self Yes  Description of Agreement verbal  Danger to Others  Danger to Others None reported or observed

## 2023-09-26 NOTE — Group Note (Signed)
Recreation Therapy Group Note   Group Topic:Stress Management  Group Date: 09/26/2023 Start Time: 0935 End Time: 1000 Facilitators: Dalynn Jhaveri-McCall, LRT,CTRS Location: [address] Dayroom   Group Topic: Stress Management  Goal Area(s) Addresses:  Patient will identify positive stress management techniques. Patient will identify benefits of using stress management post d/c.  Intervention: Insight Timer App  Activity : Meditation. LRT engaged with patients about meditation and the benefits of it. LRT then played a meditation that focused on getting prepared for the day and being able to speak positive affirmations to themselves when negative thoughts start to creep in. Patients were encouraged to relax, get in a comfortable position, focus on their breathing and be attentive to the meditation.   Education:  Stress Management, Discharge Planning.   Education Outcome: Acknowledges Education   Affect/Mood: N/A   Participation Level: Did not attend    Clinical Observations/Individualized Feedback:      Plan: Continue to engage patient in RT group sessions 2-3x/week.   Nyssa Sayegh-McCall, LRT,CTRS 09/26/2023 11:22 AM

## 2024-03-16 ENCOUNTER — Other Ambulatory Visit: Payer: Self-pay | Admitting: Nurse Practitioner

## 2024-03-16 DIAGNOSIS — K219 Gastro-esophageal reflux disease without esophagitis: Secondary | ICD-10-CM
